# Patient Record
Sex: Female | Born: 1940 | Race: White | Hispanic: No | Marital: Married | Smoking: Never smoker
Health system: Southern US, Community
[De-identification: ages and names within clinical notes are randomized; demographics above are authoritative.]

## PROBLEM LIST (undated history)

## (undated) DIAGNOSIS — I10 Essential (primary) hypertension: Secondary | ICD-10-CM

## (undated) DIAGNOSIS — E785 Hyperlipidemia, unspecified: Secondary | ICD-10-CM

## (undated) DIAGNOSIS — N390 Urinary tract infection, site not specified: Secondary | ICD-10-CM

## (undated) DIAGNOSIS — K760 Fatty (change of) liver, not elsewhere classified: Secondary | ICD-10-CM

## (undated) DIAGNOSIS — I639 Cerebral infarction, unspecified: Secondary | ICD-10-CM

## (undated) DIAGNOSIS — K219 Gastro-esophageal reflux disease without esophagitis: Secondary | ICD-10-CM

## (undated) DIAGNOSIS — C8580 Other specified types of non-Hodgkin lymphoma, unspecified site: Secondary | ICD-10-CM

## (undated) DIAGNOSIS — G629 Polyneuropathy, unspecified: Secondary | ICD-10-CM

## (undated) DIAGNOSIS — M797 Fibromyalgia: Secondary | ICD-10-CM

## (undated) DIAGNOSIS — I63411 Cerebral infarction due to embolism of right middle cerebral artery: Secondary | ICD-10-CM

## (undated) DIAGNOSIS — I480 Paroxysmal atrial fibrillation: Secondary | ICD-10-CM

## (undated) DIAGNOSIS — C8590 Non-Hodgkin lymphoma, unspecified, unspecified site: Secondary | ICD-10-CM

## (undated) DIAGNOSIS — M1990 Unspecified osteoarthritis, unspecified site: Secondary | ICD-10-CM

## (undated) DIAGNOSIS — C801 Malignant (primary) neoplasm, unspecified: Secondary | ICD-10-CM

## (undated) HISTORY — PX: APPENDECTOMY: SHX54

## (undated) HISTORY — DX: Paroxysmal atrial fibrillation: I48.0

## (undated) HISTORY — PX: ROTATOR CUFF REPAIR: SHX139

## (undated) HISTORY — DX: Non-Hodgkin lymphoma, unspecified, unspecified site: C85.90

## (undated) HISTORY — DX: Cerebral infarction, unspecified: I63.9

## (undated) HISTORY — PX: JOINT REPLACEMENT: SHX530

## (undated) HISTORY — PX: EYE SURGERY: SHX253

## (undated) HISTORY — DX: Hyperlipidemia, unspecified: E78.5

## (undated) HISTORY — DX: Polyneuropathy, unspecified: G62.9

## (undated) HISTORY — PX: CHOLECYSTECTOMY: SHX55

---

## 1997-10-27 ENCOUNTER — Other Ambulatory Visit: Admission: RE | Admit: 1997-10-27 | Discharge: 1997-10-27 | Payer: Self-pay | Admitting: Obstetrics and Gynecology

## 1998-08-23 ENCOUNTER — Emergency Department (HOSPITAL_COMMUNITY): Admission: EM | Admit: 1998-08-23 | Discharge: 1998-08-23 | Payer: Self-pay | Admitting: Emergency Medicine

## 1998-08-23 ENCOUNTER — Encounter: Payer: Self-pay | Admitting: Emergency Medicine

## 1999-02-11 ENCOUNTER — Ambulatory Visit (HOSPITAL_COMMUNITY): Admission: RE | Admit: 1999-02-11 | Discharge: 1999-02-11 | Payer: Self-pay | Admitting: *Deleted

## 1999-02-11 ENCOUNTER — Encounter: Payer: Self-pay | Admitting: *Deleted

## 2001-09-06 ENCOUNTER — Ambulatory Visit (HOSPITAL_COMMUNITY): Admission: RE | Admit: 2001-09-06 | Discharge: 2001-09-06 | Payer: Self-pay | Admitting: Gastroenterology

## 2001-09-06 ENCOUNTER — Encounter (INDEPENDENT_AMBULATORY_CARE_PROVIDER_SITE_OTHER): Payer: Self-pay | Admitting: Specialist

## 2001-11-15 ENCOUNTER — Encounter: Admission: RE | Admit: 2001-11-15 | Discharge: 2001-11-15 | Payer: Self-pay | Admitting: Family Medicine

## 2001-11-15 ENCOUNTER — Encounter: Payer: Self-pay | Admitting: Family Medicine

## 2002-03-12 ENCOUNTER — Encounter: Admission: RE | Admit: 2002-03-12 | Discharge: 2002-03-12 | Payer: Self-pay

## 2002-11-27 ENCOUNTER — Other Ambulatory Visit: Admission: RE | Admit: 2002-11-27 | Discharge: 2002-11-27 | Payer: Self-pay | Admitting: Family Medicine

## 2002-12-09 ENCOUNTER — Encounter: Payer: Self-pay | Admitting: Family Medicine

## 2002-12-09 ENCOUNTER — Ambulatory Visit (HOSPITAL_COMMUNITY): Admission: RE | Admit: 2002-12-09 | Discharge: 2002-12-09 | Payer: Self-pay | Admitting: Family Medicine

## 2004-01-05 ENCOUNTER — Other Ambulatory Visit: Admission: RE | Admit: 2004-01-05 | Discharge: 2004-01-05 | Payer: Self-pay | Admitting: Family Medicine

## 2005-03-01 ENCOUNTER — Encounter: Admission: RE | Admit: 2005-03-01 | Discharge: 2005-03-01 | Payer: Self-pay | Admitting: Family Medicine

## 2005-12-06 ENCOUNTER — Other Ambulatory Visit: Admission: RE | Admit: 2005-12-06 | Discharge: 2005-12-06 | Payer: Self-pay | Admitting: Family Medicine

## 2006-01-02 ENCOUNTER — Ambulatory Visit: Payer: Self-pay | Admitting: Internal Medicine

## 2006-01-09 LAB — CBC WITH DIFFERENTIAL/PLATELET
BASO%: 0.1 % (ref 0.0–2.0)
EOS%: 0.2 % (ref 0.0–7.0)
HCT: 35 % (ref 34.8–46.6)
LYMPH%: 5.8 % — ABNORMAL LOW (ref 14.0–48.0)
MCH: 27.5 pg (ref 26.0–34.0)
MCHC: 33.8 g/dL (ref 32.0–36.0)
MCV: 81.4 fL (ref 81.0–101.0)
MONO#: 0 10*3/uL — ABNORMAL LOW (ref 0.1–0.9)
MONO%: 0.2 % (ref 0.0–13.0)
NEUT%: 93.7 % — ABNORMAL HIGH (ref 39.6–76.8)
Platelets: 198 10*3/uL (ref 145–400)
RBC: 4.3 10*6/uL (ref 3.70–5.32)
WBC: 11.1 10*3/uL — ABNORMAL HIGH (ref 3.9–10.0)

## 2006-01-09 LAB — LACTATE DEHYDROGENASE: LDH: 412 U/L — ABNORMAL HIGH (ref 94–250)

## 2006-01-09 LAB — COMPREHENSIVE METABOLIC PANEL
ALT: 129 U/L — ABNORMAL HIGH (ref 0–40)
AST: 98 U/L — ABNORMAL HIGH (ref 0–37)
BUN: 10 mg/dL (ref 6–23)
Calcium: 8.2 mg/dL — ABNORMAL LOW (ref 8.4–10.5)
Chloride: 92 mEq/L — ABNORMAL LOW (ref 96–112)
Creatinine, Ser: 0.87 mg/dL (ref 0.40–1.20)
Total Bilirubin: 0.6 mg/dL (ref 0.3–1.2)

## 2006-01-19 LAB — COMPREHENSIVE METABOLIC PANEL
AST: 36 U/L (ref 0–37)
Alkaline Phosphatase: 162 U/L — ABNORMAL HIGH (ref 39–117)
BUN: 18 mg/dL (ref 6–23)
Glucose, Bld: 138 mg/dL — ABNORMAL HIGH (ref 70–99)
Potassium: 4.1 mEq/L (ref 3.5–5.3)
Total Bilirubin: 0.4 mg/dL (ref 0.3–1.2)

## 2006-01-19 LAB — CBC WITH DIFFERENTIAL/PLATELET
Eosinophils Absolute: 0 10*3/uL (ref 0.0–0.5)
HCT: 36.1 % (ref 34.8–46.6)
LYMPH%: 13.1 % — ABNORMAL LOW (ref 14.0–48.0)
MONO#: 0.7 10*3/uL (ref 0.1–0.9)
NEUT#: 7 10*3/uL — ABNORMAL HIGH (ref 1.5–6.5)
NEUT%: 78.3 % — ABNORMAL HIGH (ref 39.6–76.8)
Platelets: 415 10*3/uL — ABNORMAL HIGH (ref 145–400)
WBC: 8.9 10*3/uL (ref 3.9–10.0)
lymph#: 1.2 10*3/uL (ref 0.9–3.3)

## 2006-01-19 LAB — PHOSPHORUS: Phosphorus: 4.2 mg/dL (ref 2.3–4.6)

## 2006-01-19 LAB — MAGNESIUM: Magnesium: 1.4 mg/dL — ABNORMAL LOW (ref 1.5–2.5)

## 2006-01-26 LAB — MAGNESIUM: Magnesium: 1.4 mg/dL — ABNORMAL LOW (ref 1.5–2.5)

## 2006-01-26 LAB — CBC WITH DIFFERENTIAL/PLATELET
Basophils Absolute: 0 10*3/uL (ref 0.0–0.1)
EOS%: 10.4 % — ABNORMAL HIGH (ref 0.0–7.0)
Eosinophils Absolute: 0 10*3/uL (ref 0.0–0.5)
HCT: 28.6 % — ABNORMAL LOW (ref 34.8–46.6)
HGB: 9.8 g/dL — ABNORMAL LOW (ref 11.6–15.9)
MONO#: 0.1 10*3/uL (ref 0.1–0.9)
NEUT#: 0 10*3/uL — CL (ref 1.5–6.5)
NEUT%: 7 % — ABNORMAL LOW (ref 39.6–76.8)
RDW: 17.5 % — ABNORMAL HIGH (ref 11.3–14.5)
WBC: 0.5 10*3/uL — CL (ref 3.9–10.0)
lymph#: 0.3 10*3/uL — ABNORMAL LOW (ref 0.9–3.3)

## 2006-01-26 LAB — COMPREHENSIVE METABOLIC PANEL
AST: 22 U/L (ref 0–37)
Albumin: 3.7 g/dL (ref 3.5–5.2)
BUN: 17 mg/dL (ref 6–23)
CO2: 24 mEq/L (ref 19–32)
Calcium: 8.8 mg/dL (ref 8.4–10.5)
Chloride: 99 mEq/L (ref 96–112)
Glucose, Bld: 124 mg/dL — ABNORMAL HIGH (ref 70–99)
Potassium: 4.1 mEq/L (ref 3.5–5.3)

## 2006-01-29 ENCOUNTER — Inpatient Hospital Stay (HOSPITAL_COMMUNITY): Admission: AD | Admit: 2006-01-29 | Discharge: 2006-01-31 | Payer: Self-pay | Admitting: Internal Medicine

## 2006-01-31 ENCOUNTER — Ambulatory Visit: Payer: Self-pay | Admitting: Internal Medicine

## 2006-02-08 LAB — CBC WITH DIFFERENTIAL/PLATELET
Basophils Absolute: 0 10*3/uL (ref 0.0–0.1)
Eosinophils Absolute: 0 10*3/uL (ref 0.0–0.5)
HGB: 12 g/dL (ref 11.6–15.9)
LYMPH%: 24.7 % (ref 14.0–48.0)
MCV: 84.6 fL (ref 81.0–101.0)
MONO%: 9.6 % (ref 0.0–13.0)
NEUT#: 6.3 10*3/uL (ref 1.5–6.5)
NEUT%: 65.2 % (ref 39.6–76.8)
Platelets: 523 10*3/uL — ABNORMAL HIGH (ref 145–400)
RBC: 4.11 10*6/uL (ref 3.70–5.32)

## 2006-02-08 LAB — COMPREHENSIVE METABOLIC PANEL
Alkaline Phosphatase: 137 U/L — ABNORMAL HIGH (ref 39–117)
BUN: 15 mg/dL (ref 6–23)
Creatinine, Ser: 0.92 mg/dL (ref 0.40–1.20)
Glucose, Bld: 231 mg/dL — ABNORMAL HIGH (ref 70–99)
Total Bilirubin: 0.8 mg/dL (ref 0.3–1.2)

## 2006-02-16 LAB — CBC WITH DIFFERENTIAL/PLATELET
EOS%: 14.2 % — ABNORMAL HIGH (ref 0.0–7.0)
Eosinophils Absolute: 0.1 10*3/uL (ref 0.0–0.5)
MCV: 84.3 fL (ref 81.0–101.0)
MONO%: 11.5 % (ref 0.0–13.0)
NEUT#: 0.1 10*3/uL — CL (ref 1.5–6.5)
RBC: 3.68 10*6/uL — ABNORMAL LOW (ref 3.70–5.32)
RDW: 18.4 % — ABNORMAL HIGH (ref 11.3–14.5)

## 2006-02-16 LAB — COMPREHENSIVE METABOLIC PANEL
ALT: 33 U/L (ref 0–40)
AST: 16 U/L (ref 0–37)
Albumin: 4.1 g/dL (ref 3.5–5.2)
Alkaline Phosphatase: 119 U/L — ABNORMAL HIGH (ref 39–117)
Glucose, Bld: 117 mg/dL — ABNORMAL HIGH (ref 70–99)
Potassium: 3.8 mEq/L (ref 3.5–5.3)
Sodium: 140 mEq/L (ref 135–145)
Total Protein: 6.7 g/dL (ref 6.0–8.3)

## 2006-02-21 ENCOUNTER — Ambulatory Visit: Payer: Self-pay | Admitting: Internal Medicine

## 2006-02-24 ENCOUNTER — Ambulatory Visit (HOSPITAL_COMMUNITY): Admission: RE | Admit: 2006-02-24 | Discharge: 2006-02-24 | Payer: Self-pay | Admitting: Internal Medicine

## 2006-02-24 LAB — COMPREHENSIVE METABOLIC PANEL
Albumin: 4 g/dL (ref 3.5–5.2)
Alkaline Phosphatase: 121 U/L — ABNORMAL HIGH (ref 39–117)
BUN: 16 mg/dL (ref 6–23)
CO2: 25 mEq/L (ref 19–32)
Calcium: 9 mg/dL (ref 8.4–10.5)
Chloride: 107 mEq/L (ref 96–112)
Glucose, Bld: 112 mg/dL — ABNORMAL HIGH (ref 70–99)
Potassium: 4.1 mEq/L (ref 3.5–5.3)
Sodium: 140 mEq/L (ref 135–145)
Total Protein: 6.4 g/dL (ref 6.0–8.3)

## 2006-02-24 LAB — CBC WITH DIFFERENTIAL/PLATELET
Basophils Absolute: 0 10*3/uL (ref 0.0–0.1)
Eosinophils Absolute: 0 10*3/uL (ref 0.0–0.5)
HGB: 10 g/dL — ABNORMAL LOW (ref 11.6–15.9)
MCV: 86.3 fL (ref 81.0–101.0)
MONO#: 0.7 10*3/uL (ref 0.1–0.9)
NEUT#: 8.9 10*3/uL — ABNORMAL HIGH (ref 1.5–6.5)
RBC: 3.37 10*6/uL — ABNORMAL LOW (ref 3.70–5.32)
RDW: 20.1 % — ABNORMAL HIGH (ref 11.3–14.5)
WBC: 10.7 10*3/uL — ABNORMAL HIGH (ref 3.9–10.0)
lymph#: 1.1 10*3/uL (ref 0.9–3.3)

## 2006-02-24 LAB — PHOSPHORUS: Phosphorus: 4.2 mg/dL (ref 2.3–4.6)

## 2006-02-24 LAB — MAGNESIUM: Magnesium: 1.7 mg/dL (ref 1.5–2.5)

## 2006-03-02 LAB — CBC WITH DIFFERENTIAL/PLATELET
EOS%: 0.4 % (ref 0.0–7.0)
LYMPH%: 18.2 % (ref 14.0–48.0)
MCH: 30.5 pg (ref 26.0–34.0)
MCHC: 34.2 g/dL (ref 32.0–36.0)
MCV: 89.2 fL (ref 81.0–101.0)
MONO%: 4.6 % (ref 0.0–13.0)
Platelets: 326 10*3/uL (ref 145–400)
RBC: 2.86 10*6/uL — ABNORMAL LOW (ref 3.70–5.32)
RDW: 24.5 % — ABNORMAL HIGH (ref 11.3–14.5)

## 2006-03-09 LAB — CBC WITH DIFFERENTIAL/PLATELET
BASO%: 3.1 % — ABNORMAL HIGH (ref 0.0–2.0)
Eosinophils Absolute: 0.1 10*3/uL (ref 0.0–0.5)
LYMPH%: 63.1 % — ABNORMAL HIGH (ref 14.0–48.0)
MCHC: 35.1 g/dL (ref 32.0–36.0)
MONO#: 0.1 10*3/uL (ref 0.1–0.9)
MONO%: 14.6 % — ABNORMAL HIGH (ref 0.0–13.0)
NEUT#: 0 10*3/uL — CL (ref 1.5–6.5)
RBC: 2.84 10*6/uL — ABNORMAL LOW (ref 3.70–5.32)
RDW: 23 % — ABNORMAL HIGH (ref 11.3–14.5)
WBC: 0.5 10*3/uL — CL (ref 3.9–10.0)

## 2006-03-09 LAB — COMPREHENSIVE METABOLIC PANEL
ALT: 24 U/L (ref 0–40)
Albumin: 3.8 g/dL (ref 3.5–5.2)
Alkaline Phosphatase: 98 U/L (ref 39–117)
CO2: 23 mEq/L (ref 19–32)
Glucose, Bld: 187 mg/dL — ABNORMAL HIGH (ref 70–99)
Potassium: 3.5 mEq/L (ref 3.5–5.3)
Sodium: 138 mEq/L (ref 135–145)
Total Bilirubin: 1.6 mg/dL — ABNORMAL HIGH (ref 0.3–1.2)
Total Protein: 6.1 g/dL (ref 6.0–8.3)

## 2006-03-09 LAB — MAGNESIUM: Magnesium: 1.5 mg/dL (ref 1.5–2.5)

## 2006-03-16 LAB — CBC WITH DIFFERENTIAL/PLATELET
EOS%: 0.3 % (ref 0.0–7.0)
MCH: 32.2 pg (ref 26.0–34.0)
MCHC: 34 g/dL (ref 32.0–36.0)
MCV: 94.6 fL (ref 81.0–101.0)
MONO%: 6.3 % (ref 0.0–13.0)
RBC: 2.85 10*6/uL — ABNORMAL LOW (ref 3.70–5.32)
RDW: 25.4 % — ABNORMAL HIGH (ref 11.3–14.5)

## 2006-03-16 LAB — PHOSPHORUS: Phosphorus: 4.6 mg/dL (ref 2.3–4.6)

## 2006-03-16 LAB — COMPREHENSIVE METABOLIC PANEL
AST: 21 U/L (ref 0–37)
Albumin: 3.9 g/dL (ref 3.5–5.2)
Alkaline Phosphatase: 120 U/L — ABNORMAL HIGH (ref 39–117)
Potassium: 4 mEq/L (ref 3.5–5.3)
Sodium: 139 mEq/L (ref 135–145)
Total Protein: 6.5 g/dL (ref 6.0–8.3)

## 2006-03-30 ENCOUNTER — Encounter (HOSPITAL_COMMUNITY): Admission: RE | Admit: 2006-03-30 | Discharge: 2006-06-15 | Payer: Self-pay | Admitting: Internal Medicine

## 2006-03-30 LAB — CBC WITH DIFFERENTIAL/PLATELET
Basophils Absolute: 0 10*3/uL (ref 0.0–0.1)
EOS%: 9.8 % — ABNORMAL HIGH (ref 0.0–7.0)
Eosinophils Absolute: 0 10*3/uL (ref 0.0–0.5)
HCT: 22.8 % — ABNORMAL LOW (ref 34.8–46.6)
HGB: 8 g/dL — ABNORMAL LOW (ref 11.6–15.9)
MCH: 34 pg (ref 26.0–34.0)
NEUT#: 0 10*3/uL — CL (ref 1.5–6.5)
NEUT%: 5.3 % — ABNORMAL LOW (ref 39.6–76.8)
lymph#: 0.3 10*3/uL — ABNORMAL LOW (ref 0.9–3.3)

## 2006-04-04 ENCOUNTER — Ambulatory Visit: Payer: Self-pay | Admitting: Internal Medicine

## 2006-04-06 LAB — COMPREHENSIVE METABOLIC PANEL
ALT: 27 U/L (ref 0–40)
AST: 20 U/L (ref 0–37)
Alkaline Phosphatase: 104 U/L (ref 39–117)
Creatinine, Ser: 0.87 mg/dL (ref 0.40–1.20)
Total Bilirubin: 1 mg/dL (ref 0.3–1.2)

## 2006-04-06 LAB — CBC WITH DIFFERENTIAL/PLATELET
Basophils Absolute: 0 10*3/uL (ref 0.0–0.1)
Eosinophils Absolute: 0 10*3/uL (ref 0.0–0.5)
HCT: 31.1 % — ABNORMAL LOW (ref 34.8–46.6)
HGB: 10.6 g/dL — ABNORMAL LOW (ref 11.6–15.9)
LYMPH%: 9.4 % — ABNORMAL LOW (ref 14.0–48.0)
MCV: 96 fL (ref 81.0–101.0)
MONO%: 8.2 % (ref 0.0–13.0)
NEUT#: 4.1 10*3/uL (ref 1.5–6.5)
NEUT%: 82 % — ABNORMAL HIGH (ref 39.6–76.8)
Platelets: 234 10*3/uL (ref 145–400)
RBC: 3.24 10*6/uL — ABNORMAL LOW (ref 3.70–5.32)

## 2006-04-06 LAB — PHOSPHORUS: Phosphorus: 4.3 mg/dL (ref 2.3–4.6)

## 2006-04-20 LAB — CBC WITH DIFFERENTIAL/PLATELET
Basophils Absolute: 0 10*3/uL (ref 0.0–0.1)
EOS%: 18.2 % — ABNORMAL HIGH (ref 0.0–7.0)
HGB: 10 g/dL — ABNORMAL LOW (ref 11.6–15.9)
LYMPH%: 58.7 % — ABNORMAL HIGH (ref 14.0–48.0)
MCH: 32.5 pg (ref 26.0–34.0)
MCV: 93.3 fL (ref 81.0–101.0)
MONO%: 12.9 % (ref 0.0–13.0)
NEUT%: 7.3 % — ABNORMAL LOW (ref 39.6–76.8)
Platelets: 81 10*3/uL — ABNORMAL LOW (ref 145–400)
RDW: 15.1 % — ABNORMAL HIGH (ref 11.3–14.5)

## 2006-04-20 LAB — COMPREHENSIVE METABOLIC PANEL
ALT: 27 U/L (ref 0–35)
AST: 15 U/L (ref 0–37)
BUN: 16 mg/dL (ref 6–23)
Creatinine, Ser: 0.84 mg/dL (ref 0.40–1.20)
Total Bilirubin: 1.4 mg/dL — ABNORMAL HIGH (ref 0.3–1.2)

## 2006-04-20 LAB — PHOSPHORUS: Phosphorus: 4.1 mg/dL (ref 2.3–4.6)

## 2006-04-20 LAB — LACTATE DEHYDROGENASE: LDH: 132 U/L (ref 94–250)

## 2006-04-27 LAB — COMPREHENSIVE METABOLIC PANEL
AST: 23 U/L (ref 0–37)
Albumin: 3.8 g/dL (ref 3.5–5.2)
BUN: 12 mg/dL (ref 6–23)
Calcium: 9.3 mg/dL (ref 8.4–10.5)
Chloride: 105 mEq/L (ref 96–112)
Creatinine, Ser: 0.92 mg/dL (ref 0.40–1.20)
Glucose, Bld: 180 mg/dL — ABNORMAL HIGH (ref 70–99)
Potassium: 4 mEq/L (ref 3.5–5.3)

## 2006-04-27 LAB — CBC WITH DIFFERENTIAL/PLATELET
Basophils Absolute: 0 10*3/uL (ref 0.0–0.1)
EOS%: 0.4 % (ref 0.0–7.0)
Eosinophils Absolute: 0 10*3/uL (ref 0.0–0.5)
HCT: 28.9 % — ABNORMAL LOW (ref 34.8–46.6)
HGB: 10.1 g/dL — ABNORMAL LOW (ref 11.6–15.9)
MCH: 33.7 pg (ref 26.0–34.0)
MCV: 96.9 fL (ref 81.0–101.0)
NEUT#: 5 10*3/uL (ref 1.5–6.5)
NEUT%: 84 % — ABNORMAL HIGH (ref 39.6–76.8)
lymph#: 0.4 10*3/uL — ABNORMAL LOW (ref 0.9–3.3)

## 2006-04-27 LAB — LACTATE DEHYDROGENASE: LDH: 195 U/L (ref 94–250)

## 2006-05-01 ENCOUNTER — Ambulatory Visit (HOSPITAL_COMMUNITY): Admission: RE | Admit: 2006-05-01 | Discharge: 2006-05-01 | Payer: Self-pay | Admitting: Internal Medicine

## 2006-05-03 LAB — CBC WITH DIFFERENTIAL/PLATELET
BASO%: 0.2 % (ref 0.0–2.0)
Basophils Absolute: 0 10*3/uL (ref 0.0–0.1)
EOS%: 1 % (ref 0.0–7.0)
HCT: 27 % — ABNORMAL LOW (ref 34.8–46.6)
HGB: 9.3 g/dL — ABNORMAL LOW (ref 11.6–15.9)
MCH: 33.5 pg (ref 26.0–34.0)
MCHC: 34.3 g/dL (ref 32.0–36.0)
MCV: 97.9 fL (ref 81.0–101.0)
MONO%: 18.1 % — ABNORMAL HIGH (ref 0.0–13.0)
NEUT%: 62.5 % (ref 39.6–76.8)
RDW: 19.1 % — ABNORMAL HIGH (ref 11.3–14.5)

## 2006-05-03 LAB — COMPREHENSIVE METABOLIC PANEL
ALT: 33 U/L (ref 0–35)
AST: 31 U/L (ref 0–37)
Alkaline Phosphatase: 120 U/L — ABNORMAL HIGH (ref 39–117)
BUN: 17 mg/dL (ref 6–23)
Creatinine, Ser: 1.07 mg/dL (ref 0.40–1.20)
Total Bilirubin: 0.7 mg/dL (ref 0.3–1.2)

## 2006-05-12 LAB — COMPREHENSIVE METABOLIC PANEL
Albumin: 3.7 g/dL (ref 3.5–5.2)
Alkaline Phosphatase: 82 U/L (ref 39–117)
BUN: 12 mg/dL (ref 6–23)
Creatinine, Ser: 0.85 mg/dL (ref 0.40–1.20)
Glucose, Bld: 183 mg/dL — ABNORMAL HIGH (ref 70–99)
Potassium: 3.3 mEq/L — ABNORMAL LOW (ref 3.5–5.3)

## 2006-05-12 LAB — CBC WITH DIFFERENTIAL/PLATELET
Basophils Absolute: 0 10*3/uL (ref 0.0–0.1)
Eosinophils Absolute: 0 10*3/uL (ref 0.0–0.5)
HCT: 25.7 % — ABNORMAL LOW (ref 34.8–46.6)
HGB: 9 g/dL — ABNORMAL LOW (ref 11.6–15.9)
LYMPH%: 35 % (ref 14.0–48.0)
MCH: 33.7 pg (ref 26.0–34.0)
MCV: 96.1 fL (ref 81.0–101.0)
MONO%: 35.8 % — ABNORMAL HIGH (ref 0.0–13.0)
NEUT#: 0.1 10*3/uL — CL (ref 1.5–6.5)
NEUT%: 21.3 % — ABNORMAL LOW (ref 39.6–76.8)
Platelets: 48 10*3/uL — ABNORMAL LOW (ref 145–400)
RDW: 17.6 % — ABNORMAL HIGH (ref 11.3–14.5)

## 2006-05-15 ENCOUNTER — Ambulatory Visit: Payer: Self-pay | Admitting: Internal Medicine

## 2006-05-15 LAB — CBC WITH DIFFERENTIAL/PLATELET
Basophils Absolute: 0 10*3/uL (ref 0.0–0.1)
HCT: 26.9 % — ABNORMAL LOW (ref 34.8–46.6)
HGB: 9.3 g/dL — ABNORMAL LOW (ref 11.6–15.9)
MCH: 33.5 pg (ref 26.0–34.0)
MONO#: 0.2 10*3/uL (ref 0.1–0.9)
NEUT%: 88.8 % — ABNORMAL HIGH (ref 39.6–76.8)
WBC: 4.7 10*3/uL (ref 3.9–10.0)
lymph#: 0.3 10*3/uL — ABNORMAL LOW (ref 0.9–3.3)

## 2006-05-17 LAB — COMPREHENSIVE METABOLIC PANEL
AST: 27 U/L (ref 0–37)
Alkaline Phosphatase: 87 U/L (ref 39–117)
BUN: 19 mg/dL (ref 6–23)
Calcium: 9.2 mg/dL (ref 8.4–10.5)
Chloride: 102 mEq/L (ref 96–112)
Creatinine, Ser: 1.18 mg/dL (ref 0.40–1.20)

## 2006-05-17 LAB — CBC WITH DIFFERENTIAL/PLATELET
Basophils Absolute: 0.2 10*3/uL — ABNORMAL HIGH (ref 0.0–0.1)
EOS%: 0.5 % (ref 0.0–7.0)
HCT: 29 % — ABNORMAL LOW (ref 34.8–46.6)
HGB: 9.8 g/dL — ABNORMAL LOW (ref 11.6–15.9)
MCH: 33 pg (ref 26.0–34.0)
MCV: 97.2 fL (ref 81.0–101.0)
MONO%: 7.3 % (ref 0.0–13.0)
NEUT%: 77.5 % — ABNORMAL HIGH (ref 39.6–76.8)
Platelets: 194 10*3/uL (ref 145–400)

## 2006-05-23 LAB — CBC WITH DIFFERENTIAL/PLATELET
BASO%: 0.2 % (ref 0.0–2.0)
EOS%: 0.6 % (ref 0.0–7.0)
HCT: 28.2 % — ABNORMAL LOW (ref 34.8–46.6)
MCH: 34 pg (ref 26.0–34.0)
MCHC: 34.2 g/dL (ref 32.0–36.0)
MONO#: 0.4 10*3/uL (ref 0.1–0.9)
NEUT%: 69.9 % (ref 39.6–76.8)
RBC: 2.84 10*6/uL — ABNORMAL LOW (ref 3.70–5.32)
RDW: 19.4 % — ABNORMAL HIGH (ref 11.3–14.5)
WBC: 3.9 10*3/uL (ref 3.9–10.0)
lymph#: 0.7 10*3/uL — ABNORMAL LOW (ref 0.9–3.3)

## 2006-05-23 LAB — COMPREHENSIVE METABOLIC PANEL
ALT: 24 U/L (ref 0–35)
AST: 26 U/L (ref 0–37)
Calcium: 8.9 mg/dL (ref 8.4–10.5)
Chloride: 103 mEq/L (ref 96–112)
Creatinine, Ser: 0.83 mg/dL (ref 0.40–1.20)
Sodium: 141 mEq/L (ref 135–145)
Total Protein: 6.2 g/dL (ref 6.0–8.3)

## 2006-06-01 LAB — CBC WITH DIFFERENTIAL/PLATELET
BASO%: 1.4 % (ref 0.0–2.0)
EOS%: 10.5 % — ABNORMAL HIGH (ref 0.0–7.0)
HCT: 27.3 % — ABNORMAL LOW (ref 34.8–46.6)
LYMPH%: 70.8 % — ABNORMAL HIGH (ref 14.0–48.0)
MCH: 32.8 pg (ref 26.0–34.0)
MCHC: 33.8 g/dL (ref 32.0–36.0)
NEUT%: 4.1 % — ABNORMAL LOW (ref 39.6–76.8)
Platelets: 69 10*3/uL — ABNORMAL LOW (ref 145–400)
RBC: 2.82 10*6/uL — ABNORMAL LOW (ref 3.70–5.32)
lymph#: 0.2 10*3/uL — ABNORMAL LOW (ref 0.9–3.3)

## 2006-06-08 LAB — CBC WITH DIFFERENTIAL/PLATELET
Basophils Absolute: 0 10*3/uL (ref 0.0–0.1)
EOS%: 0.2 % (ref 0.0–7.0)
HCT: 26.8 % — ABNORMAL LOW (ref 34.8–46.6)
HGB: 9.1 g/dL — ABNORMAL LOW (ref 11.6–15.9)
MCH: 33.9 pg (ref 26.0–34.0)
MCV: 99.8 fL (ref 81.0–101.0)
MONO%: 8.5 % (ref 0.0–13.0)
NEUT%: 74.8 % (ref 39.6–76.8)

## 2006-06-09 ENCOUNTER — Ambulatory Visit (HOSPITAL_COMMUNITY): Admission: RE | Admit: 2006-06-09 | Discharge: 2006-06-09 | Payer: Self-pay | Admitting: Internal Medicine

## 2006-06-15 LAB — COMPREHENSIVE METABOLIC PANEL
AST: 24 U/L (ref 0–37)
Albumin: 3.9 g/dL (ref 3.5–5.2)
Alkaline Phosphatase: 92 U/L (ref 39–117)
BUN: 16 mg/dL (ref 6–23)
Potassium: 3.7 mEq/L (ref 3.5–5.3)
Sodium: 142 mEq/L (ref 135–145)
Total Bilirubin: 0.8 mg/dL (ref 0.3–1.2)

## 2006-06-15 LAB — CBC WITH DIFFERENTIAL/PLATELET
Basophils Absolute: 0 10*3/uL (ref 0.0–0.1)
EOS%: 0.8 % (ref 0.0–7.0)
MCH: 34.2 pg — ABNORMAL HIGH (ref 26.0–34.0)
MCV: 101.8 fL — ABNORMAL HIGH (ref 81.0–101.0)
MONO%: 15.1 % — ABNORMAL HIGH (ref 0.0–13.0)
RBC: 2.63 10*6/uL — ABNORMAL LOW (ref 3.70–5.32)
RDW: 21 % — ABNORMAL HIGH (ref 11.3–14.5)

## 2006-09-04 ENCOUNTER — Ambulatory Visit: Payer: Self-pay | Admitting: Internal Medicine

## 2006-09-06 LAB — CBC WITH DIFFERENTIAL/PLATELET
Basophils Absolute: 0 10*3/uL (ref 0.0–0.1)
Eosinophils Absolute: 0.2 10*3/uL (ref 0.0–0.5)
HGB: 13.1 g/dL (ref 11.6–15.9)
LYMPH%: 22.3 % (ref 14.0–48.0)
MCV: 90.7 fL (ref 81.0–101.0)
MONO#: 0.5 10*3/uL (ref 0.1–0.9)
NEUT#: 3.2 10*3/uL (ref 1.5–6.5)
Platelets: 263 10*3/uL (ref 145–400)
RBC: 4.04 10*6/uL (ref 3.70–5.32)
RDW: 13.5 % (ref 11.3–14.5)
WBC: 5.1 10*3/uL (ref 3.9–10.0)

## 2006-09-06 LAB — COMPREHENSIVE METABOLIC PANEL
ALT: 27 U/L (ref 0–35)
AST: 22 U/L (ref 0–37)
Albumin: 3.9 g/dL (ref 3.5–5.2)
BUN: 19 mg/dL (ref 6–23)
Calcium: 9.3 mg/dL (ref 8.4–10.5)
Chloride: 105 mEq/L (ref 96–112)
Potassium: 4.2 mEq/L (ref 3.5–5.3)
Sodium: 141 mEq/L (ref 135–145)
Total Protein: 6.6 g/dL (ref 6.0–8.3)

## 2006-09-08 ENCOUNTER — Ambulatory Visit (HOSPITAL_COMMUNITY): Admission: RE | Admit: 2006-09-08 | Discharge: 2006-09-08 | Payer: Self-pay | Admitting: Internal Medicine

## 2006-10-09 ENCOUNTER — Ambulatory Visit (HOSPITAL_COMMUNITY): Admission: RE | Admit: 2006-10-09 | Discharge: 2006-10-09 | Payer: Self-pay | Admitting: Internal Medicine

## 2006-12-08 ENCOUNTER — Ambulatory Visit: Payer: Self-pay | Admitting: Internal Medicine

## 2006-12-13 LAB — CBC WITH DIFFERENTIAL/PLATELET
BASO%: 0.7 % (ref 0.0–2.0)
EOS%: 3.4 % (ref 0.0–7.0)
LYMPH%: 28.4 % (ref 14.0–48.0)
MCH: 32 pg (ref 26.0–34.0)
MCHC: 35.5 g/dL (ref 32.0–36.0)
MCV: 90.1 fL (ref 81.0–101.0)
MONO%: 14 % — ABNORMAL HIGH (ref 0.0–13.0)
Platelets: 253 10*3/uL (ref 145–400)
RBC: 4.06 10*6/uL (ref 3.70–5.32)
RDW: 13.8 % (ref 11.3–14.5)

## 2006-12-13 LAB — COMPREHENSIVE METABOLIC PANEL
ALT: 29 U/L (ref 0–35)
BUN: 18 mg/dL (ref 6–23)
CO2: 25 mEq/L (ref 19–32)
Calcium: 9.2 mg/dL (ref 8.4–10.5)
Chloride: 106 mEq/L (ref 96–112)
Creatinine, Ser: 0.87 mg/dL (ref 0.40–1.20)
Glucose, Bld: 120 mg/dL — ABNORMAL HIGH (ref 70–99)

## 2006-12-13 LAB — LACTATE DEHYDROGENASE: LDH: 152 U/L (ref 94–250)

## 2006-12-15 ENCOUNTER — Ambulatory Visit (HOSPITAL_COMMUNITY): Admission: RE | Admit: 2006-12-15 | Discharge: 2006-12-15 | Payer: Self-pay | Admitting: Internal Medicine

## 2007-01-17 ENCOUNTER — Inpatient Hospital Stay (HOSPITAL_COMMUNITY): Admission: RE | Admit: 2007-01-17 | Discharge: 2007-01-20 | Payer: Self-pay | Admitting: Orthopedic Surgery

## 2007-02-12 ENCOUNTER — Ambulatory Visit (HOSPITAL_COMMUNITY): Admission: RE | Admit: 2007-02-12 | Discharge: 2007-02-12 | Payer: Self-pay | Admitting: Urology

## 2007-04-13 ENCOUNTER — Ambulatory Visit: Payer: Self-pay | Admitting: Internal Medicine

## 2007-04-17 LAB — CBC WITH DIFFERENTIAL/PLATELET
Eosinophils Absolute: 0.1 10*3/uL (ref 0.0–0.5)
MONO#: 0.6 10*3/uL (ref 0.1–0.9)
NEUT#: 3.8 10*3/uL (ref 1.5–6.5)
Platelets: 268 10*3/uL (ref 145–400)
RBC: 4.21 10*6/uL (ref 3.70–5.32)
RDW: 14.2 % (ref 11.3–14.5)
WBC: 6.3 10*3/uL (ref 3.9–10.0)

## 2007-04-17 LAB — COMPREHENSIVE METABOLIC PANEL
Albumin: 4.2 g/dL (ref 3.5–5.2)
CO2: 23 mEq/L (ref 19–32)
Chloride: 106 mEq/L (ref 96–112)
Glucose, Bld: 112 mg/dL — ABNORMAL HIGH (ref 70–99)
Potassium: 4 mEq/L (ref 3.5–5.3)
Sodium: 141 mEq/L (ref 135–145)
Total Protein: 6.7 g/dL (ref 6.0–8.3)

## 2007-04-17 LAB — LACTATE DEHYDROGENASE: LDH: 166 U/L (ref 94–250)

## 2007-04-19 ENCOUNTER — Ambulatory Visit (HOSPITAL_COMMUNITY): Admission: RE | Admit: 2007-04-19 | Discharge: 2007-04-19 | Payer: Self-pay | Admitting: Internal Medicine

## 2007-08-27 ENCOUNTER — Encounter: Admission: RE | Admit: 2007-08-27 | Discharge: 2007-08-27 | Payer: Self-pay | Admitting: Internal Medicine

## 2007-10-11 ENCOUNTER — Ambulatory Visit: Payer: Self-pay | Admitting: Internal Medicine

## 2007-10-16 LAB — COMPREHENSIVE METABOLIC PANEL
ALT: 40 U/L — ABNORMAL HIGH (ref 0–35)
Albumin: 4.3 g/dL (ref 3.5–5.2)
CO2: 24 mEq/L (ref 19–32)
Calcium: 9.7 mg/dL (ref 8.4–10.5)
Chloride: 101 mEq/L (ref 96–112)
Glucose, Bld: 126 mg/dL — ABNORMAL HIGH (ref 70–99)
Potassium: 4.3 mEq/L (ref 3.5–5.3)
Sodium: 139 mEq/L (ref 135–145)
Total Protein: 6.8 g/dL (ref 6.0–8.3)

## 2007-10-16 LAB — LACTATE DEHYDROGENASE: LDH: 162 U/L (ref 94–250)

## 2007-10-16 LAB — CBC WITH DIFFERENTIAL/PLATELET
BASO%: 0.1 % (ref 0.0–2.0)
Eosinophils Absolute: 0.2 10*3/uL (ref 0.0–0.5)
HCT: 40.3 % (ref 34.8–46.6)
MCHC: 35.2 g/dL (ref 32.0–36.0)
MONO#: 0.6 10*3/uL (ref 0.1–0.9)
NEUT#: 4.1 10*3/uL (ref 1.5–6.5)
Platelets: 265 10*3/uL (ref 145–400)
RBC: 4.43 10*6/uL (ref 3.70–5.32)
WBC: 7 10*3/uL (ref 3.9–10.0)
lymph#: 2.1 10*3/uL (ref 0.9–3.3)

## 2007-10-18 ENCOUNTER — Ambulatory Visit (HOSPITAL_COMMUNITY): Admission: RE | Admit: 2007-10-18 | Discharge: 2007-10-18 | Payer: Self-pay | Admitting: Internal Medicine

## 2007-11-01 ENCOUNTER — Other Ambulatory Visit: Admission: RE | Admit: 2007-11-01 | Discharge: 2007-11-01 | Payer: Self-pay | Admitting: Internal Medicine

## 2007-11-14 ENCOUNTER — Ambulatory Visit (HOSPITAL_COMMUNITY): Admission: RE | Admit: 2007-11-14 | Discharge: 2007-11-14 | Payer: Self-pay | Admitting: Internal Medicine

## 2007-11-28 ENCOUNTER — Ambulatory Visit (HOSPITAL_BASED_OUTPATIENT_CLINIC_OR_DEPARTMENT_OTHER): Admission: RE | Admit: 2007-11-28 | Discharge: 2007-11-28 | Payer: Self-pay | Admitting: Urology

## 2007-11-28 ENCOUNTER — Encounter (INDEPENDENT_AMBULATORY_CARE_PROVIDER_SITE_OTHER): Payer: Self-pay | Admitting: Urology

## 2008-03-03 ENCOUNTER — Inpatient Hospital Stay (HOSPITAL_COMMUNITY): Admission: RE | Admit: 2008-03-03 | Discharge: 2008-03-05 | Payer: Self-pay | Admitting: Urology

## 2008-03-03 ENCOUNTER — Encounter (INDEPENDENT_AMBULATORY_CARE_PROVIDER_SITE_OTHER): Payer: Self-pay | Admitting: Urology

## 2008-04-11 ENCOUNTER — Ambulatory Visit: Payer: Self-pay | Admitting: Internal Medicine

## 2008-04-15 ENCOUNTER — Ambulatory Visit (HOSPITAL_COMMUNITY): Admission: RE | Admit: 2008-04-15 | Discharge: 2008-04-15 | Payer: Self-pay | Admitting: Internal Medicine

## 2008-04-15 LAB — CBC WITH DIFFERENTIAL/PLATELET
BASO%: 0.5 % (ref 0.0–2.0)
Eosinophils Absolute: 0.3 10*3/uL (ref 0.0–0.5)
LYMPH%: 21.8 % (ref 14.0–48.0)
MCHC: 34.3 g/dL (ref 32.0–36.0)
MCV: 91.4 fL (ref 81.0–101.0)
MONO%: 6.9 % (ref 0.0–13.0)
NEUT%: 67.5 % (ref 39.6–76.8)
Platelets: 248 10*3/uL (ref 145–400)
RBC: 4.52 10*6/uL (ref 3.70–5.32)

## 2008-04-15 LAB — COMPREHENSIVE METABOLIC PANEL
ALT: 47 U/L — ABNORMAL HIGH (ref 0–35)
BUN: 18 mg/dL (ref 6–23)
CO2: 25 mEq/L (ref 19–32)
Creatinine, Ser: 0.96 mg/dL (ref 0.40–1.20)
Total Bilirubin: 1 mg/dL (ref 0.3–1.2)

## 2008-04-15 LAB — LACTATE DEHYDROGENASE: LDH: 147 U/L (ref 94–250)

## 2008-07-17 ENCOUNTER — Ambulatory Visit (HOSPITAL_COMMUNITY): Admission: RE | Admit: 2008-07-17 | Discharge: 2008-07-17 | Payer: Self-pay | Admitting: Urology

## 2008-10-13 ENCOUNTER — Ambulatory Visit: Payer: Self-pay | Admitting: Internal Medicine

## 2008-10-15 ENCOUNTER — Ambulatory Visit (HOSPITAL_COMMUNITY): Admission: RE | Admit: 2008-10-15 | Discharge: 2008-10-15 | Payer: Self-pay | Admitting: Internal Medicine

## 2008-10-15 LAB — CBC WITH DIFFERENTIAL/PLATELET
BASO%: 0.6 % (ref 0.0–2.0)
EOS%: 3.8 % (ref 0.0–7.0)
LYMPH%: 29.6 % (ref 14.0–49.7)
MCHC: 34.4 g/dL (ref 31.5–36.0)
MCV: 91.7 fL (ref 79.5–101.0)
MONO#: 0.6 10*3/uL (ref 0.1–0.9)
MONO%: 9.2 % (ref 0.0–14.0)
Platelets: 221 10*3/uL (ref 145–400)
RBC: 4.49 10*6/uL (ref 3.70–5.45)
WBC: 6.2 10*3/uL (ref 3.9–10.3)

## 2008-10-15 LAB — COMPREHENSIVE METABOLIC PANEL
ALT: 47 U/L — ABNORMAL HIGH (ref 0–35)
Alkaline Phosphatase: 120 U/L — ABNORMAL HIGH (ref 39–117)
Sodium: 140 mEq/L (ref 135–145)
Total Bilirubin: 1.2 mg/dL (ref 0.3–1.2)
Total Protein: 6.5 g/dL (ref 6.0–8.3)

## 2008-12-23 ENCOUNTER — Other Ambulatory Visit: Admission: RE | Admit: 2008-12-23 | Discharge: 2008-12-23 | Payer: Self-pay | Admitting: Internal Medicine

## 2009-03-24 ENCOUNTER — Ambulatory Visit (HOSPITAL_COMMUNITY): Admission: RE | Admit: 2009-03-24 | Discharge: 2009-03-24 | Payer: Self-pay | Admitting: Urology

## 2009-04-15 ENCOUNTER — Ambulatory Visit: Payer: Self-pay | Admitting: Internal Medicine

## 2009-04-22 ENCOUNTER — Ambulatory Visit (HOSPITAL_COMMUNITY): Admission: RE | Admit: 2009-04-22 | Discharge: 2009-04-22 | Payer: Self-pay | Admitting: Internal Medicine

## 2009-04-22 LAB — CBC WITH DIFFERENTIAL/PLATELET
BASO%: 0.7 % (ref 0.0–2.0)
EOS%: 3.5 % (ref 0.0–7.0)
Eosinophils Absolute: 0.2 10*3/uL (ref 0.0–0.5)
MCH: 31.7 pg (ref 25.1–34.0)
MCV: 94.8 fL (ref 79.5–101.0)
MONO%: 9.2 % (ref 0.0–14.0)
NEUT#: 3.2 10*3/uL (ref 1.5–6.5)
RBC: 4.39 10*6/uL (ref 3.70–5.45)
RDW: 14.5 % (ref 11.2–14.5)

## 2009-04-22 LAB — COMPREHENSIVE METABOLIC PANEL
ALT: 42 U/L — ABNORMAL HIGH (ref 0–35)
AST: 30 U/L (ref 0–37)
Albumin: 4 g/dL (ref 3.5–5.2)
Alkaline Phosphatase: 97 U/L (ref 39–117)
Potassium: 3.9 mEq/L (ref 3.5–5.3)
Sodium: 139 mEq/L (ref 135–145)
Total Protein: 6.7 g/dL (ref 6.0–8.3)

## 2009-04-23 ENCOUNTER — Encounter: Admission: RE | Admit: 2009-04-23 | Discharge: 2009-04-23 | Payer: Self-pay | Admitting: Internal Medicine

## 2009-05-04 ENCOUNTER — Inpatient Hospital Stay (HOSPITAL_COMMUNITY): Admission: RE | Admit: 2009-05-04 | Discharge: 2009-05-07 | Payer: Self-pay | Admitting: Orthopedic Surgery

## 2009-06-01 ENCOUNTER — Encounter: Admission: RE | Admit: 2009-06-01 | Discharge: 2009-06-18 | Payer: Self-pay | Admitting: Orthopedic Surgery

## 2009-06-18 ENCOUNTER — Encounter: Admission: RE | Admit: 2009-06-18 | Discharge: 2009-07-24 | Payer: Self-pay | Admitting: Orthopedic Surgery

## 2009-10-21 ENCOUNTER — Inpatient Hospital Stay (HOSPITAL_COMMUNITY): Admission: AD | Admit: 2009-10-21 | Discharge: 2009-10-25 | Payer: Self-pay | Admitting: Internal Medicine

## 2009-11-02 ENCOUNTER — Ambulatory Visit: Payer: Self-pay | Admitting: Internal Medicine

## 2009-11-04 ENCOUNTER — Ambulatory Visit: Payer: Self-pay | Admitting: Infectious Diseases

## 2009-11-06 ENCOUNTER — Inpatient Hospital Stay (HOSPITAL_COMMUNITY): Admission: AD | Admit: 2009-11-06 | Discharge: 2009-11-22 | Payer: Self-pay | Admitting: Internal Medicine

## 2009-11-06 ENCOUNTER — Ambulatory Visit: Payer: Self-pay | Admitting: Cardiology

## 2009-11-06 ENCOUNTER — Encounter (INDEPENDENT_AMBULATORY_CARE_PROVIDER_SITE_OTHER): Payer: Self-pay | Admitting: Internal Medicine

## 2009-11-07 ENCOUNTER — Ambulatory Visit: Payer: Self-pay | Admitting: Gastroenterology

## 2009-11-10 ENCOUNTER — Encounter (INDEPENDENT_AMBULATORY_CARE_PROVIDER_SITE_OTHER): Payer: Self-pay | Admitting: Internal Medicine

## 2009-11-10 ENCOUNTER — Ambulatory Visit: Payer: Self-pay | Admitting: Internal Medicine

## 2009-11-13 ENCOUNTER — Encounter (INDEPENDENT_AMBULATORY_CARE_PROVIDER_SITE_OTHER): Payer: Self-pay | Admitting: Internal Medicine

## 2009-11-27 ENCOUNTER — Ambulatory Visit (HOSPITAL_COMMUNITY): Admission: RE | Admit: 2009-11-27 | Discharge: 2009-11-27 | Payer: Self-pay | Admitting: Infectious Disease

## 2009-11-30 ENCOUNTER — Telehealth: Payer: Self-pay | Admitting: Infectious Disease

## 2009-11-30 ENCOUNTER — Ambulatory Visit: Payer: Self-pay | Admitting: Infectious Disease

## 2009-11-30 DIAGNOSIS — Z9189 Other specified personal risk factors, not elsewhere classified: Secondary | ICD-10-CM | POA: Insufficient documentation

## 2009-11-30 DIAGNOSIS — R197 Diarrhea, unspecified: Secondary | ICD-10-CM

## 2009-11-30 DIAGNOSIS — N289 Disorder of kidney and ureter, unspecified: Secondary | ICD-10-CM | POA: Insufficient documentation

## 2009-11-30 DIAGNOSIS — C8590 Non-Hodgkin lymphoma, unspecified, unspecified site: Secondary | ICD-10-CM | POA: Insufficient documentation

## 2009-11-30 HISTORY — DX: Diarrhea, unspecified: R19.7

## 2009-11-30 LAB — CONVERTED CEMR LAB
BUN: 13 mg/dL (ref 6–23)
Basophils Absolute: 0.1 10*3/uL (ref 0.0–0.1)
CRP: 1.9 mg/dL — ABNORMAL HIGH (ref <0.6)
Calcium: 9.7 mg/dL (ref 8.4–10.5)
Eosinophils Absolute: 0.3 10*3/uL (ref 0.0–0.7)
Eosinophils Relative: 4 % (ref 0–5)
Glucose, Bld: 102 mg/dL — ABNORMAL HIGH (ref 70–99)
HCT: 37 % (ref 36.0–46.0)
Lymphocytes Relative: 45 % (ref 12–46)
Neutrophils Relative %: 43 % (ref 43–77)
Platelets: 414 10*3/uL — ABNORMAL HIGH (ref 150–400)
RDW: 16.4 % — ABNORMAL HIGH (ref 11.5–15.5)
Sodium: 140 meq/L (ref 135–145)

## 2009-12-01 ENCOUNTER — Ambulatory Visit: Payer: Self-pay | Admitting: Infectious Disease

## 2009-12-25 ENCOUNTER — Ambulatory Visit (HOSPITAL_COMMUNITY): Admission: RE | Admit: 2009-12-25 | Discharge: 2009-12-25 | Payer: Self-pay | Admitting: Infectious Disease

## 2009-12-31 ENCOUNTER — Telehealth: Payer: Self-pay | Admitting: Infectious Disease

## 2010-01-19 ENCOUNTER — Ambulatory Visit: Payer: Self-pay | Admitting: Infectious Disease

## 2010-01-19 DIAGNOSIS — R109 Unspecified abdominal pain: Secondary | ICD-10-CM | POA: Insufficient documentation

## 2010-01-19 LAB — CONVERTED CEMR LAB
BUN: 15 mg/dL (ref 6–23)
CO2: 25 meq/L (ref 19–32)
Calcium: 9.5 mg/dL (ref 8.4–10.5)
Casts: NONE SEEN /lpf
Chloride: 101 meq/L (ref 96–112)
Creatinine, Ser: 0.98 mg/dL (ref 0.40–1.20)
Eosinophils Relative: 5 % (ref 0–5)
Glucose, Bld: 99 mg/dL (ref 70–99)
HCT: 41.4 % (ref 36.0–46.0)
Hemoglobin, Urine: NEGATIVE
Hemoglobin: 12.9 g/dL (ref 12.0–15.0)
Lymphocytes Relative: 45 % (ref 12–46)
Monocytes Absolute: 0.6 10*3/uL (ref 0.1–1.0)
Monocytes Relative: 8 % (ref 3–12)
Neutro Abs: 3 10*3/uL (ref 1.7–7.7)
Nitrite: NEGATIVE
RBC: 4.39 M/uL (ref 3.87–5.11)
Specific Gravity, Urine: 1.01 (ref 1.005–1.030)
Total Bilirubin: 1 mg/dL (ref 0.3–1.2)
Urine Glucose: NEGATIVE mg/dL
pH: 6 (ref 5.0–8.0)

## 2010-01-20 ENCOUNTER — Encounter: Payer: Self-pay | Admitting: Infectious Disease

## 2010-07-11 ENCOUNTER — Encounter: Payer: Self-pay | Admitting: Internal Medicine

## 2010-07-22 NOTE — Miscellaneous (Signed)
Summary: HIPAA Restrictions  HIPAA Restrictions   Imported By: Florinda Marker 11/30/2009 11:54:16  _____________________________________________________________________  External Attachment:    Type:   Image     Comment:   External Document

## 2010-07-22 NOTE — Progress Notes (Signed)
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

## 2010-07-22 NOTE — Assessment & Plan Note (Signed)
Summary: F/U OV/VS   Visit Type:  Follow-up Referring Provider:  Pearson Grippe Primary Provider:  Pearson Grippe  CC:  follow-up visit.  History of Present Illness: 70 yo with hx of NHL sp rx in 2008 admitted on May 4 for acute renal failure, nausea, vomiting, and diarrhea.  Her CT scan on suggested possible pyelonephritis and she was treated with ceftriaxone and flagyl and then converted to Cipro along with Flagyl. She was readmitted on May the 17th again with diarrhea, and subjective fevers, and malaise and right sided flank pain. CT  chest, abd and pelvis on the 19th showed enlarged of right kidney suspiciuos for malignant infiltration of the right kidney vs infection. She was covered w broad spectrum antibiotics including vancomycin, rocephin and flagyl and only grew 15k of CNS from urine. Her blood cxs were repeatedly negative off abx. Her c diff toxins all negative. A CT abd and pelvis on the 25th showed persistent enlarged kidney felt by radiology more c/w malignant infiltration than infection. She underwent IR guided bx of this kidney that showed acute and chronic inflammation w lymphoid aggregates but no malignant cells. flow done on T but not B cells. She was tapered off of all antibiotics but continued to have fevers and yet again had negative blood and urine cultures. She persisted in having R sided flank pain as well. I checked SPEP which showed abnormal spike, HIV and viral hepatitides as well as quantiferon gold, crypto ag, CMV, EBV, ANA, ANCA which were all negative. ESR, CRP and ferritin were all elevated. I remained highly suspicous for malignancy in her kidney and after d/w Dr. Myna Hidalgo ordered PET scan which was done last week and which continues to show hypermetabolic involvement of the kidney. When I saw he rin clinc at last visit she still had flank pain. UA was dirty and culture grew Klebsiella pna that was quite s and I trated this with cipro. She underwent repeat biopsy of her kidney which  did not show  malignancy this time either. It was suppose dto have been sent for culture for AFB, fungal and bacterial culture.She did have flank pain last week withou fevers. It did resolve withotu therapy.  Problems Prior to Update: 1)  Diarrhea  (ICD-787.91) 2)  Unspecified Disorder of Kidney and Ureter  (ICD-593.9) 3)  Fever, Hx of  (ICD-V15.9) 4)  Non-hodgkin's Lymphoma  (ICD-202.80)  Medications Prior to Update: 1)  Bactrim Ds 800-160 Mg Tabs (Sulfamethoxazole-Trimethoprim) .... Take 1 Tablet By Mouth Two Times A Day For 10 Days  Current Medications (verified): 1)  Bactrim Ds 800-160 Mg Tabs (Sulfamethoxazole-Trimethoprim) .... Take 1 Tablet By Mouth Two Times A Day For 10 Days  Allergies (verified): No Known Drug Allergies   Preventive Screening-Counseling & Management  Alcohol-Tobacco     Alcohol drinks/day: 0     Smoking Status: never  Caffeine-Diet-Exercise     Caffeine use/day: coffee,tea and sodas occassionally     Does Patient Exercise: yes     Type of exercise: active around the house  Safety-Violence-Falls     Seat Belt Use: yes   Current Allergies (reviewed today): No known allergies  Family History: Reviewed history from 11/30/2009 and no changes required.    Her father passed away at age 53 of brain cancer.  Her   mother passed away at age 20 of congestive heart failure.  Social History: Reviewed history from 11/30/2009 and no changes required.  The patient is married and lives at home with her  husband.  She is retired.  She does not smoke, drink, or use drugs.   Review of Systems  The patient denies anorexia, fever, weight loss, weight gain, vision loss, decreased hearing, hoarseness, chest pain, syncope, dyspnea on exertion, peripheral edema, prolonged cough, headaches, hemoptysis, abdominal pain, melena, hematochezia, severe indigestion/heartburn, hematuria, incontinence, genital sores, muscle weakness, suspicious skin lesions, transient  blindness, difficulty walking, depression, unusual weight change, abnormal bleeding, and enlarged lymph nodes.    Vital Signs:  Patient profile:   70 year old female Height:      59 inches (149.86 cm) Weight:      163.0 pounds (74.09 kg) BMI:     33.04 Temp:     98.2 degrees F (36.78 degrees C) oral Pulse rate:   58 / minute BP sitting:   168 / 98  (left arm)  Vitals Entered By: Baxter Hire) (January 19, 2010 10:46 AM) CC: follow-up visit Pain Assessment Patient in pain? no      Nutritional Status BMI of > 30 = obese Nutritional Status Detail appetite is pretty good per patient  Does patient need assistance? Functional Status Self care Ambulation Normal   Physical Exam  General:  alert, well-developed, well-nourished, and well-hydrated.   Head:  normocephalic, atraumatic, and no abnormalities observed.   Eyes:  vision grossly intact, pupils equal, pupils round, and pupils reactive to light.   Mouth:  no dental plaque, pharynx pink and moist, no erythema, and no exudates.   Neck:  supple and full ROM.   Lungs:  normal respiratory effort, no intercostal retractions, no crackles, and no wheezes.   Heart:  normal rate, regular rhythm, no murmur, no gallop, and no rub.   Abdomen:  soft, non-tender, normal bowel sounds, no distention, and no masses.   Msk:  normal ROM.  right CVA tenderness Extremities:  No clubbing, cyanosis, edema, or deformity noted with normal full range of motion of all joints.   Neurologic:  alert & oriented X3, strength normal in all extremities, and gait normal.   Psych:  Oriented X3, memory intact for recent and remote, and good eye contact.     Impression & Recommendations:  Problem # 1:  FLANK PAIN, RIGHT (ICD-789.09)  I am going to check ua and culture right now despite the fact that she does not have ssx at this point in time.   Orders: T-CBC w/Diff ([id number]) T-Comprehensive Metabolic Panel [phone]) T-Culture, Urine  ([id number]) T-Urinalysis ([id number]) Est. Patient Level IV (64403)  Problem # 2:  FEVER, HX OF (ICD-V15.9)  Very odd story. Hard to pin this on recurrent UTIs. wil check culture as above. Bx of her kidney has been attempted twice  Orders: Est. 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 BIopsy was done 2nd time and did not show malignacy. Flow was not complete due to shortGE OF CELLS BU twas negaitve in what was looked at.  Orders: Est. Patient Level IV (95638)  Patient Instructions: 1)  We will check labs today 2)  Please make fu appt to see Dr. Daiva Eves 3)  Make appt to see Dr. Laverle Patter

## 2010-07-22 NOTE — Assessment & Plan Note (Signed)
Summary: 11:00 per kvd/hsfu need chart/fever/kam   Vital Signs:  Patient profile:   70 year old female Height:      59 inches (149.86 cm) Weight:      157.4 pounds (71.55 kg) BMI:     31.91 Temp:     97.9 degrees F (36.61 degrees C) oral Pulse rate:   71 / minute BP sitting:   154 / 91  (right arm)  Vitals Entered By: Baxter Hire) (November 30, 2009 11:39 AM)  Visit Type:  Follow-up Referring Provider:  Pearson Grippe Primary Provider:  Pearson Grippe  CC:  fever.  History of Present Illness: 70 yo with hx of NHL sp rx in 2008 admitted on May 4 for acute renal failure, nausea, vomiting, and diarrhea.  Her CT scan on suggested possible pyelonephritis and she was treated with ceftriaxone and flagyl and then converted to Cipro along with Flagyl. She was readmitted on May the 17th again with diarrhea, and subjective fevers, and malaise and right sided flank pain. CT  chest, abd and pelvis on the 19th showed enlarged of right kidney suspiciuos for malignant infiltration of the right kidney vs infection. She was covered w broad spectrum antibiotics including vancomycin, rocephin and flagyl and only grew 15k of CNS from urine. Her blood cxs were repeatedly negative off abx. Her c diff toxins all negative. A CT abd and pelvis on the 25th showed persistent enlarged kidney felt by radiology more c/w malignant infiltration than infection. She underwent IR guided bx of this kidney that showed acute and chronic inflammation w lymphoid aggregates but no malignant cells. flow done on T but not B cells. She was tapered off of all antibiotics but continued to have fevers and yet again had negative blood and urine cultures. She persisted in having R sided flank pain as well. I checked SPEP which showed abnormal spike, HIV and viral hepatitides as well as quantiferon gold, crypto ag, CMV, EBV, ANA, ANCA which were all negative. ESR, CRP and ferritin were all elevated. I remained highly suspicous for malignancy in her  kidney and after d/w Dr. Myna Hidalgo ordered PET scan which was done last week and which continues to show hypermetabolic involvement of the kidney. She returns for followup today. She denies fevers but continues to have right sided flank pain and malaise.  Allergies (verified): No Known Drug Allergies  Past History:  Past Medical History: Osteoarthritis of knees, status post right total knee arthroplasty.  Mild postoperative hyponatremia, mild postoperative acute blood       loss anemia.   History of shingles.   History of bronchitis.  Hypertension.  . Hyperlipidemia.   . Diet-controlled diabetes.  Non-Hodgkin's lymphoma.  .Fibromyalgia.  .Childhood illnesses of measles, mumps, rubella.   Past Surgical History: .Cholecystectomy.  Marland KitchenAppendectomy.  Tubal ligation.   .Rotator cuff repair.   .Left total knee arthroplasty.   Reimplantation of the ureter onto the bladder.      Family History:    Her father passed away at age 11 of brain cancer.  Her   mother passed away at age 63 of congestive heart failure.  Social History:  The patient is married and lives at home with her   husband.  She is retired.  She does not smoke, drink, or use drugs.   Review of Systems  The patient denies anorexia, fever, weight loss, weight gain, vision loss, decreased hearing, hoarseness, chest pain, syncope, dyspnea on exertion, peripheral edema, prolonged cough, headaches, hemoptysis, abdominal pain, melena,  hematochezia, severe indigestion/heartburn, hematuria, incontinence, genital sores, muscle weakness, suspicious skin lesions, transient blindness, difficulty walking, depression, unusual weight change, abnormal bleeding, enlarged lymph nodes, angioedema, breast masses, and testicular masses.    Physical Exam  General:  alert, well-developed, well-nourished, and well-hydrated.   Head:  normocephalic, atraumatic, and no abnormalities observed.   Eyes:  vision grossly intact, pupils equal, pupils  round, and pupils reactive to light.   Ears:  no external deformities and ear piercing(s) noted.   Nose:  no external deformity and no external erythema.   Mouth:  no dental plaque, pharynx pink and moist, no erythema, and no exudates.   Neck:  supple and full ROM.   Lungs:  normal respiratory effort, no intercostal retractions, no crackles, and no wheezes.   Heart:  normal rate, regular rhythm, no murmur, no gallop, and no rub.   Abdomen:  soft, non-tender, normal bowel sounds, no distention, and no masses.   Msk:  normal ROM.  right CVA tenderness Extremities:  No clubbing, cyanosis, edema, or deformity noted with normal full range of motion of all joints.   Neurologic:  alert & oriented X3, strength normal in all extremities, and gait normal.   Skin:  turgor normal, color normal, and no rashes.   Psych:  Oriented X3, memory intact for recent and remote, and good eye contact.     Impression & Recommendations:  Problem # 1:  FEVER, HX OF (ICD-V15.9) Assessment Comment Only  I really, really think that everything points to a malignancy as likely cause of her fevers. We need a repeat biopsy of her kidney. Bone marrow biopsy could also be contemplated. I will discuss this case with Dr. Shirline Frees and see how he would like to proceed by I would imainge biopsy by repeat IR guided approach with specimens sent for path, flow, culture, afb, and fungal would be first appoach.  Orders: Est. Patient Level IV (04540)  Problem # 2:  UNSPECIFIED DISORDER OF KIDNEY AND URETER (ICD-593.9)  see above discussion. She needs repeat bx  Orders: Est. 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 She needs repeat bx  Orders: Est. Patient Level IV (14782)  Problem # 4:  DIARRHEA (ICD-787.91) Assessment: Improved  improved  Orders: Est. Patient Level IV (95621)  Patient Instructions: 1)  rtc to see Dr. Daiva Eves  2)   I will call Dr. Shirline Frees about biopsy of your kidney

## 2010-07-22 NOTE — Progress Notes (Signed)
Summary: Pt. wanting to know results of Kidney biopsy  Phone Note Call from Patient Call back at Home Phone [phone]   Caller: Patient Call For: Dr. Paulette Blanch Dam Reason for Call: Talk to Doctor, Lab or Test Results Summary of Call: Called to find out results of Kidney biopsy.  Pt. informed that the results hav not been reviewed by Dr. Daiva Eves at this time.  Pt. told that she would receive a call Monday, July 18th.  Jennet Maduro RN  December 31, 2009 5:04 PM  Patient called again today very concerned about kidney biposy, I told patient that Dr. Daiva Eves would be back tomorrow morning and I will make sure she gets a phone call tomorrow. Starleen Arms CMA  January 05, 2010 11:33 AM   Initial call taken by: Starleen Arms CMA,  January 05, 2010 11:33 AM  Follow-up for Phone Call        Called and left message that I would call again re biopsy results. Path report did not show cancer on path or flow cytometry though flow was not complete flow dytometry exam. Follow-up by: Acey Lav MD,  January 06, 2010 12:42 PM

## 2010-09-05 LAB — CBC
HCT: 37.3 % (ref 36.0–46.0)
MCH: 30.2 pg (ref 26.0–34.0)
MCV: 90.3 fL (ref 78.0–100.0)
RDW: 17.5 % — ABNORMAL HIGH (ref 11.5–15.5)
WBC: 6.7 10*3/uL (ref 4.0–10.5)

## 2010-09-05 LAB — APTT: aPTT: 25 seconds (ref 24–37)

## 2010-09-05 LAB — GLUCOSE, CAPILLARY
Glucose-Capillary: 101 mg/dL — ABNORMAL HIGH (ref 70–99)
Glucose-Capillary: 118 mg/dL — ABNORMAL HIGH (ref 70–99)

## 2010-09-06 LAB — COMPREHENSIVE METABOLIC PANEL
ALT: 17 U/L (ref 0–35)
ALT: 20 U/L (ref 0–35)
ALT: 21 U/L (ref 0–35)
AST: 71 U/L — ABNORMAL HIGH (ref 0–37)
AST: 63 U/L — ABNORMAL HIGH (ref 0–37)
Albumin: 2.1 g/dL — ABNORMAL LOW (ref 3.5–5.2)
Albumin: 2.2 g/dL — ABNORMAL LOW (ref 3.5–5.2)
Albumin: 2.3 g/dL — ABNORMAL LOW (ref 3.5–5.2)
Alkaline Phosphatase: 64 U/L (ref 39–117)
Alkaline Phosphatase: 70 U/L (ref 39–117)
Alkaline Phosphatase: 79 U/L (ref 39–117)
BUN: 8 mg/dL (ref 6–23)
BUN: 14 mg/dL (ref 6–23)
BUN: 4 mg/dL — ABNORMAL LOW (ref 6–23)
BUN: 6 mg/dL (ref 6–23)
BUN: 9 mg/dL (ref 6–23)
CO2: 26 mEq/L (ref 19–32)
CO2: 23 mEq/L (ref 19–32)
CO2: 27 mEq/L (ref 19–32)
Calcium: 8.5 mg/dL (ref 8.4–10.5)
Calcium: 8 mg/dL — ABNORMAL LOW (ref 8.4–10.5)
Calcium: 8.2 mg/dL — ABNORMAL LOW (ref 8.4–10.5)
Calcium: 8.5 mg/dL (ref 8.4–10.5)
Chloride: 104 mEq/L (ref 96–112)
Chloride: 105 mEq/L (ref 96–112)
Chloride: 107 mEq/L (ref 96–112)
Creatinine, Ser: 0.94 mg/dL (ref 0.4–1.2)
Creatinine, Ser: 0.9 mg/dL (ref 0.4–1.2)
Creatinine, Ser: 0.92 mg/dL (ref 0.4–1.2)
Creatinine, Ser: 1.02 mg/dL (ref 0.4–1.2)
Creatinine, Ser: 1.14 mg/dL (ref 0.4–1.2)
GFR calc Af Amer: >60 mL/min (ref >60)
GFR calc Af Amer: 57 mL/min — ABNORMAL LOW (ref >60)
GFR calc Af Amer: >60 mL/min (ref >60)
GFR calc Af Amer: >60 mL/min (ref >60)
GFR calc non Af Amer: 59 mL/min — ABNORMAL LOW (ref >60)
GFR calc non Af Amer: 55 mL/min — ABNORMAL LOW (ref >60)
GFR calc non Af Amer: >60 mL/min (ref >60)
Glucose, Bld: 118 mg/dL — ABNORMAL HIGH (ref 70–99)
Glucose, Bld: 94 mg/dL (ref 70–99)
Glucose, Bld: 95 mg/dL (ref 70–99)
Glucose, Bld: 96 mg/dL (ref 70–99)
Potassium: 3.7 mEq/L (ref 3.5–5.1)
Potassium: 3.7 mEq/L (ref 3.5–5.1)
Sodium: 142 mEq/L (ref 135–145)
Sodium: 142 mEq/L (ref 135–145)
Total Bilirubin: 0.6 mg/dL (ref 0.3–1.2)
Total Protein: 5.4 g/dL — ABNORMAL LOW (ref 6.0–8.3)
Total Protein: 5.6 g/dL — ABNORMAL LOW (ref 6.0–8.3)
Total Protein: 5.6 g/dL — ABNORMAL LOW (ref 6.0–8.3)
Total Protein: 5.9 g/dL — ABNORMAL LOW (ref 6.0–8.3)
Total Protein: 6.2 g/dL (ref 6.0–8.3)

## 2010-09-06 LAB — CMV IGM: CMV IgM: <8 AU/mL (ref <30.0)

## 2010-09-06 LAB — DIFFERENTIAL
Basophils Absolute: 0 10*3/uL (ref 0.0–0.1)
Basophils Absolute: 0 10*3/uL (ref 0.0–0.1)
Basophils Absolute: 0.1 10*3/uL (ref 0.0–0.1)
Basophils Relative: 0 % (ref 0–1)
Basophils Relative: 0 % (ref 0–1)
Basophils Relative: 1 % (ref 0–1)
Basophils Relative: 1 % (ref 0–1)
Eosinophils Absolute: 0.1 10*3/uL (ref 0.0–0.7)
Eosinophils Absolute: 0.2 10*3/uL (ref 0.0–0.7)
Eosinophils Relative: 2 % (ref 0–5)
Eosinophils Relative: 2 % (ref 0–5)
Lymphocytes Relative: 36 % (ref 12–46)
Lymphocytes Relative: 12 % (ref 12–46)
Lymphocytes Relative: 19 % (ref 12–46)
Lymphocytes Relative: 19 % (ref 12–46)
Lymphs Abs: 1 10*3/uL (ref 0.7–4.0)
Lymphs Abs: 1.1 10*3/uL (ref 0.7–4.0)
Lymphs Abs: 1.3 10*3/uL (ref 0.7–4.0)
Monocytes Absolute: 0.8 10*3/uL (ref 0.1–1.0)
Monocytes Absolute: 0.6 10*3/uL (ref 0.1–1.0)
Monocytes Absolute: 0.7 10*3/uL (ref 0.1–1.0)
Monocytes Relative: 6 % (ref 3–12)
Monocytes Relative: 8 % (ref 3–12)
Monocytes Relative: 9 % (ref 3–12)
Neutro Abs: 3.4 10*3/uL (ref 1.7–7.7)
Neutro Abs: 4.5 10*3/uL (ref 1.7–7.7)
Neutro Abs: 5.8 10*3/uL (ref 1.7–7.7)
Neutro Abs: 9.7 10*3/uL — ABNORMAL HIGH (ref 1.7–7.7)
Neutrophils Relative %: 50 % (ref 43–77)
Neutrophils Relative %: 69 % (ref 43–77)
Neutrophils Relative %: 69 % (ref 43–77)
Neutrophils Relative %: 78 % — ABNORMAL HIGH (ref 43–77)
Neutrophils Relative %: 84 % — ABNORMAL HIGH (ref 43–77)

## 2010-09-06 LAB — CBC
HCT: 27.5 % — ABNORMAL LOW (ref 36.0–46.0)
HCT: 25 % — ABNORMAL LOW (ref 36.0–46.0)
HCT: 26.3 % — ABNORMAL LOW (ref 36.0–46.0)
HCT: 27 % — ABNORMAL LOW (ref 36.0–46.0)
HCT: 27.8 % — ABNORMAL LOW (ref 36.0–46.0)
HCT: 27.8 % — ABNORMAL LOW (ref 36.0–46.0)
HCT: 28 % — ABNORMAL LOW (ref 36.0–46.0)
HCT: 28.1 % — ABNORMAL LOW (ref 36.0–46.0)
Hemoglobin: 11.3 g/dL — ABNORMAL LOW (ref 12.0–15.0)
Hemoglobin: 8.4 g/dL — ABNORMAL LOW (ref 12.0–15.0)
Hemoglobin: 8.5 g/dL — ABNORMAL LOW (ref 12.0–15.0)
Hemoglobin: 8.9 g/dL — ABNORMAL LOW (ref 12.0–15.0)
Hemoglobin: 9.2 g/dL — ABNORMAL LOW (ref 12.0–15.0)
Hemoglobin: 9.3 g/dL — ABNORMAL LOW (ref 12.0–15.0)
Hemoglobin: 9.4 g/dL — ABNORMAL LOW (ref 12.0–15.0)
MCHC: 33.2 g/dL (ref 30.0–36.0)
MCHC: 32.7 g/dL (ref 30.0–36.0)
MCHC: 32.9 g/dL (ref 30.0–36.0)
MCHC: 33 g/dL (ref 30.0–36.0)
MCHC: 33.1 g/dL (ref 30.0–36.0)
MCHC: 33.3 g/dL (ref 30.0–36.0)
MCHC: 33.9 g/dL (ref 30.0–36.0)
MCHC: 34 g/dL (ref 30.0–36.0)
MCV: 89 fL (ref 78.0–100.0)
MCV: 89.5 fL (ref 78.0–100.0)
MCV: 89.9 fL (ref 78.0–100.0)
MCV: 91.1 fL (ref 78.0–100.0)
MCV: 92.3 fL (ref 78.0–100.0)
MCV: 92.3 fL (ref 78.0–100.0)
Platelets: 308 10*3/uL (ref 150–400)
Platelets: 301 10*3/uL (ref 150–400)
Platelets: 329 10*3/uL (ref 150–400)
RBC: 3.1 MIL/uL — ABNORMAL LOW (ref 3.87–5.11)
RBC: 2.92 MIL/uL — ABNORMAL LOW (ref 3.87–5.11)
RBC: 3.04 MIL/uL — ABNORMAL LOW (ref 3.87–5.11)
RBC: 3.13 MIL/uL — ABNORMAL LOW (ref 3.87–5.11)
RBC: 3.79 MIL/uL — ABNORMAL LOW (ref 3.87–5.11)
RDW: 15 % (ref 11.5–15.5)
RDW: 15.3 % (ref 11.5–15.5)
RDW: 15.3 % (ref 11.5–15.5)
RDW: 15.3 % (ref 11.5–15.5)
RDW: 15.4 % (ref 11.5–15.5)
RDW: 15.5 % (ref 11.5–15.5)
WBC: 10.2 10*3/uL (ref 4.0–10.5)
WBC: 11.6 10*3/uL — ABNORMAL HIGH (ref 4.0–10.5)
WBC: 6.5 10*3/uL (ref 4.0–10.5)
WBC: 8.7 10*3/uL (ref 4.0–10.5)

## 2010-09-06 LAB — URINE CULTURE: Special Requests: NEGATIVE

## 2010-09-06 LAB — RHEUMATOID FACTOR: Rhuematoid fact SerPl-aCnc: <20 IU/mL (ref 0–20)

## 2010-09-06 LAB — URINE MICROSCOPIC-ADD ON

## 2010-09-06 LAB — URINALYSIS, ROUTINE W REFLEX MICROSCOPIC
Bilirubin Urine: NEGATIVE
Bilirubin Urine: NEGATIVE
Hgb urine dipstick: NEGATIVE
Hgb urine dipstick: NEGATIVE
Ketones, ur: NEGATIVE mg/dL
Nitrite: NEGATIVE
Protein, ur: NEGATIVE mg/dL
Specific Gravity, Urine: 1.011 (ref 1.005–1.030)
Urobilinogen, UA: 0.2 mg/dL (ref 0.0–1.0)
Urobilinogen, UA: 0.2 mg/dL (ref 0.0–1.0)

## 2010-09-06 LAB — FOLATE RBC: RBC Folate: 400 ng/mL (ref 180–600)

## 2010-09-06 LAB — FERRITIN: Ferritin: 755 ng/mL — ABNORMAL HIGH (ref 10–291)

## 2010-09-06 LAB — CLOSTRIDIUM DIFFICILE EIA
C difficile Toxins A+B, EIA: NEGATIVE
C difficile Toxins A+B, EIA: NEGATIVE

## 2010-09-06 LAB — STOOL CULTURE

## 2010-09-06 LAB — OSMOLALITY, STOOL: Osmolality,Stl: 370 mOsm

## 2010-09-06 LAB — CULTURE, BLOOD (ROUTINE X 2)
Culture: NO GROWTH
Culture: NO GROWTH
Culture: NO GROWTH

## 2010-09-06 LAB — FUNGUS CULTURE, BLOOD

## 2010-09-06 LAB — PROTIME-INR
INR: 1.45 (ref 0.00–1.49)
Prothrombin Time: 17.5 seconds — ABNORMAL HIGH (ref 11.6–15.2)

## 2010-09-06 LAB — PROTEIN ELECTROPHORESIS, SERUM
Albumin ELP: 44.5 % — ABNORMAL LOW (ref 55.8–66.1)
Beta Globulin: 6.1 % (ref 4.7–7.2)
M-Spike, %: 0.35 g/dL
Total Protein ELP: 6.2 g/dL (ref 6.0–8.3)

## 2010-09-06 LAB — HEMOCCULT GUIAC POC 1CARD (OFFICE)
Fecal Occult Bld: NEGATIVE
Fecal Occult Bld: NEGATIVE

## 2010-09-06 LAB — OVA AND PARASITE EXAMINATION: Ova and parasites: NONE SEEN

## 2010-09-06 LAB — ANA: Anti Nuclear Antibody(ANA): NEGATIVE

## 2010-09-06 LAB — CREATININE, SERUM: GFR calc non Af Amer: 59 mL/min — ABNORMAL LOW (ref >60)

## 2010-09-06 LAB — LACTATE DEHYDROGENASE, ISOENZYMES
LD1/LD2 Ratio: 0.59
LDH 2: 27 % — ABNORMAL LOW (ref 30–43)
LDH 3: 25 % (ref 16–26)
LDH 4: 13 % — ABNORMAL HIGH (ref 3–12)
LDH Isoenzymes, Total: 201 U/L (ref 120–250)

## 2010-09-06 LAB — HEPATITIS PANEL, ACUTE
HCV Ab: NEGATIVE
Hep B C IgM: NEGATIVE
Hepatitis B Surface Ag: NEGATIVE

## 2010-09-06 LAB — ANTI-NEUTROPHIL ANTIBODY

## 2010-09-06 LAB — SODIUM, STOOL: Sodium, Stl: 7 mEq/L

## 2010-09-06 LAB — QUANTIFERON TB GOLD ASSAY (BLOOD): Mitogen Minus Nil Value: 6.76 IU/mL

## 2010-09-06 LAB — FECAL LACTOFERRIN, QUANT

## 2010-09-06 LAB — PROCALCITONIN: Procalcitonin: <0.5 ng/mL

## 2010-09-06 LAB — EPSTEIN-BARR VIRUS VCA ANTIBODY PANEL
EBV NA IgG: 1.44 {ISR} — ABNORMAL HIGH
EBV VCA IgM: 0.21 {ISR}

## 2010-09-06 LAB — POTASSIUM, STOOL: Potassium, Stl: 31 mEq/L

## 2010-09-06 LAB — AFB CULTURE, BLOOD

## 2010-09-06 LAB — VANCOMYCIN, TROUGH: Vancomycin Tr: 14.1 ug/mL (ref 10.0–20.0)

## 2010-09-06 LAB — GLUCOSE, CAPILLARY: Glucose-Capillary: 125 mg/dL — ABNORMAL HIGH (ref 70–99)

## 2010-09-06 LAB — SEDIMENTATION RATE: Sed Rate: 85 mm/hr — ABNORMAL HIGH (ref 0–22)

## 2010-09-07 LAB — CBC
HCT: 36 % (ref 36.0–46.0)
Hemoglobin: 10.8 g/dL — ABNORMAL LOW (ref 12.0–15.0)
Hemoglobin: 12.6 g/dL (ref 12.0–15.0)
MCV: 92.3 fL (ref 78.0–100.0)
RBC: 3.43 MIL/uL — ABNORMAL LOW (ref 3.87–5.11)
RBC: 3.91 MIL/uL (ref 3.87–5.11)
WBC: 10 10*3/uL (ref 4.0–10.5)

## 2010-09-07 LAB — COMPREHENSIVE METABOLIC PANEL
ALT: 28 U/L (ref 0–35)
AST: 23 U/L (ref 0–37)
Alkaline Phosphatase: 96 U/L (ref 39–117)
CO2: 23 mEq/L (ref 19–32)
Calcium: 8.2 mg/dL — ABNORMAL LOW (ref 8.4–10.5)
Chloride: 101 mEq/L (ref 96–112)
GFR calc Af Amer: 29 mL/min — ABNORMAL LOW (ref >60)
GFR calc non Af Amer: 24 mL/min — ABNORMAL LOW (ref >60)
Potassium: 3.7 mEq/L (ref 3.5–5.1)
Sodium: 134 mEq/L — ABNORMAL LOW (ref 135–145)
Total Bilirubin: 1.7 mg/dL — ABNORMAL HIGH (ref 0.3–1.2)

## 2010-09-07 LAB — URINE MICROSCOPIC-ADD ON

## 2010-09-07 LAB — DIFFERENTIAL
Basophils Absolute: 0 10*3/uL (ref 0.0–0.1)
Eosinophils Absolute: 0.1 10*3/uL (ref 0.0–0.7)
Eosinophils Relative: 1 % (ref 0–5)
Lymphocytes Relative: 6 % — ABNORMAL LOW (ref 12–46)
Lymphs Abs: 0.3 10*3/uL — ABNORMAL LOW (ref 0.7–4.0)
Monocytes Absolute: 1.1 10*3/uL — ABNORMAL HIGH (ref 0.1–1.0)
Monocytes Relative: 11 % (ref 3–12)
Neutro Abs: 8.6 10*3/uL — ABNORMAL HIGH (ref 1.7–7.7)

## 2010-09-07 LAB — URINALYSIS, ROUTINE W REFLEX MICROSCOPIC
Ketones, ur: NEGATIVE mg/dL
Nitrite: NEGATIVE
pH: 5.5 (ref 5.0–8.0)

## 2010-09-07 LAB — BASIC METABOLIC PANEL
BUN: 18 mg/dL (ref 6–23)
CO2: 23 mEq/L (ref 19–32)
Calcium: 7.5 mg/dL — ABNORMAL LOW (ref 8.4–10.5)
Chloride: 110 mEq/L (ref 96–112)
GFR calc Af Amer: 43 mL/min — ABNORMAL LOW (ref >60)
GFR calc non Af Amer: 34 mL/min — ABNORMAL LOW (ref >60)
GFR calc non Af Amer: 36 mL/min — ABNORMAL LOW (ref >60)
Glucose, Bld: 122 mg/dL — ABNORMAL HIGH (ref 70–99)
Potassium: 3.7 mEq/L (ref 3.5–5.1)
Potassium: 3.7 mEq/L (ref 3.5–5.1)
Sodium: 136 mEq/L (ref 135–145)
Sodium: 138 mEq/L (ref 135–145)
Sodium: 138 mEq/L (ref 135–145)

## 2010-09-07 LAB — CLOSTRIDIUM DIFFICILE EIA: C difficile Toxins A+B, EIA: NEGATIVE

## 2010-09-07 LAB — TROPONIN I: Troponin I: 0.03 ng/mL (ref 0.00–0.06)

## 2010-09-07 LAB — FECAL LACTOFERRIN, QUANT: Fecal Lactoferrin: NEGATIVE

## 2010-09-09 ENCOUNTER — Encounter: Payer: Self-pay | Admitting: *Deleted

## 2010-09-22 LAB — URINE MICROSCOPIC-ADD ON

## 2010-09-22 LAB — URINALYSIS, ROUTINE W REFLEX MICROSCOPIC
Glucose, UA: NEGATIVE mg/dL
Hgb urine dipstick: NEGATIVE
Specific Gravity, Urine: 1.017 (ref 1.005–1.030)
Urobilinogen, UA: 0.2 mg/dL (ref 0.0–1.0)

## 2010-09-22 LAB — GLUCOSE, CAPILLARY
Glucose-Capillary: 123 mg/dL — ABNORMAL HIGH (ref 70–99)
Glucose-Capillary: 126 mg/dL — ABNORMAL HIGH (ref 70–99)
Glucose-Capillary: 144 mg/dL — ABNORMAL HIGH (ref 70–99)
Glucose-Capillary: 147 mg/dL — ABNORMAL HIGH (ref 70–99)
Glucose-Capillary: 78 mg/dL (ref 70–99)

## 2010-09-22 LAB — PROTIME-INR
INR: 1.47 (ref 0.00–1.49)
Prothrombin Time: 17.7 seconds — ABNORMAL HIGH (ref 11.6–15.2)

## 2010-09-22 LAB — BASIC METABOLIC PANEL
BUN: 8 mg/dL (ref 6–23)
CO2: 24 mEq/L (ref 19–32)
Calcium: 8.6 mg/dL (ref 8.4–10.5)
Chloride: 101 mEq/L (ref 96–112)
Creatinine, Ser: 0.8 mg/dL (ref 0.4–1.2)
Creatinine, Ser: 0.96 mg/dL (ref 0.4–1.2)
GFR calc Af Amer: >60 mL/min (ref >60)
GFR calc non Af Amer: >60 mL/min (ref >60)
Potassium: 4.1 mEq/L (ref 3.5–5.1)
Potassium: 4.6 mEq/L (ref 3.5–5.1)

## 2010-09-22 LAB — TYPE AND SCREEN
ABO/RH(D): O NEG
Antibody Screen: NEGATIVE

## 2010-09-22 LAB — COMPREHENSIVE METABOLIC PANEL
ALT: 34 U/L (ref 0–35)
AST: 25 U/L (ref 0–37)
Albumin: 3.8 g/dL (ref 3.5–5.2)
CO2: 27 mEq/L (ref 19–32)
Calcium: 9.2 mg/dL (ref 8.4–10.5)
GFR calc Af Amer: >60 mL/min (ref >60)
GFR calc non Af Amer: >60 mL/min (ref >60)
Sodium: 138 mEq/L (ref 135–145)
Total Protein: 6.7 g/dL (ref 6.0–8.3)

## 2010-09-22 LAB — CBC
HCT: 28.4 % — ABNORMAL LOW (ref 36.0–46.0)
HCT: 31.7 % — ABNORMAL LOW (ref 36.0–46.0)
Hemoglobin: 10.7 g/dL — ABNORMAL LOW (ref 12.0–15.0)
Hemoglobin: 9.7 g/dL — ABNORMAL LOW (ref 12.0–15.0)
MCHC: 33.6 g/dL (ref 30.0–36.0)
MCHC: 34.3 g/dL (ref 30.0–36.0)
MCHC: 34.8 g/dL (ref 30.0–36.0)
MCV: 94.4 fL (ref 78.0–100.0)
Platelets: 187 10*3/uL (ref 150–400)
Platelets: 215 10*3/uL (ref 150–400)
RBC: 3.36 MIL/uL — ABNORMAL LOW (ref 3.87–5.11)
RBC: 4.37 MIL/uL (ref 3.87–5.11)
RDW: 14.4 % (ref 11.5–15.5)
WBC: 6.3 10*3/uL (ref 4.0–10.5)
WBC: 8.3 10*3/uL (ref 4.0–10.5)

## 2010-09-22 LAB — APTT: aPTT: 29 seconds (ref 24–37)

## 2010-11-02 NOTE — H&P (Signed)
NAMECLEMENCIA, Olson NO.:  [id number]   MEDICAL RECORD NO.:  [id number]          PATIENT TYPE:  INP   LOCATION:  1525                         FACILITY:  [facility]   PHYSICIAN:  Ollen Gross, M.D.    DATE OF BIRTH:  1940-12-06   DATE OF ADMISSION:  01/17/2007  DATE OF DISCHARGE:                              HISTORY & PHYSICAL   CHIEF COMPLAINT:  Left knee pain.   HISTORY OF PRESENT ILLNESS:  The patient is a 70 year old female who has  been seen by Dr. Lequita Halt for ongoing left knee pain.  She had initial  evaluation back in August of 2006 and underwent injections at that time  which only provided minimal benefit.  She developed lymphoma last year  and had to be treated for this.  She has reached a point now where her  knees are preventing her from doing anything she would like to do.  She  has gone through her treatment and is now disease-free from her non-  Hodgkin's lymphoma.  She had a recent PET scan.  It is felt that she has  reached a point that she can benefit from undergoing surgical  intervention.  Her x-rays do show end-stage bone-on-bone arthritis.  The  risks and benefits have been discussed, and she elects to proceed with  surgery.  She has been seen preoperatively by Dr. Elias Else and felt  she is medically clear for upcoming surgery.   ALLERGIES:  NO KNOWN DRUG ALLERGIES.   CURRENT MEDICATIONS:  Lyrica, Cymbalta, Micardis, atenolol, potassium,  Nexium, simvastatin, Evista, calcium, Centrum Silver.   PAST MEDICAL HISTORY:  1. Fibromyalgia.  2. History of bronchitis.  3. Hypercholesterolemia.  4. Hypertension.  5. Reflux disease.  6. History of anemia.  7. History of non-Hodgkin's lymphoma.   PAST SURGICAL HISTORY:  1. Appendectomy.  2. Gallbladder surgery.  3. Tubal ligation.  4. Heel surgery.  5. Knee surgery.  6. Rotator cuff surgery.  7. Recent upper endoscopy and recent colonoscopy.   SOCIAL HISTORY:  Married, retired.   Nonsmoker, no alcohol.  Six  children.  Husband and daughter will be assisting with care after  surgery.   FAMILY HISTORY:  Father deceased in his 77s with history of  hypertension, diabetes, brain cancer, and stroke.  Both parents had  arthritis.  Mother deceased in her 61s with history of heart disease,  hypertension, diabetes, and breast cancer.   REVIEW OF SYSTEMS:  GENERAL:  No fevers, chills, or night sweats.  NEUROLOGIC:  No seizures, syncope, or paralysis.  She does have  fibromyalgia.  RESPIRATORY:  She has shortness of breath on exertion.  No shortness of breath at rest.  No productive cough or hemoptysis.  CARDIOVASCULAR:  No chest pain, angina, or orthopnea.  GI:  No nausea,  vomiting, diarrhea, or constipation.  GU:  No dysuria or hematuria.  No  discharge.  MUSCULOSKELETAL:  Left knee.   PHYSICAL EXAMINATION:  VITAL SIGNS:  Pulse 64, respirations 12, blood  pressure 126/82.  GENERAL:  70 year old white female, well-developed, well-nourished, in  no acute distress.  NEUROLOGIC:  She is alert, oriented,  and cooperative.  Pleasant.  Good  historian.  HEENT:  Normocephalic, atraumatic.  Pupils are equal, round, and  reactive.  Oropharynx clear.  Tympanic membranes intact.  She does have  a permanent upper bridge with a lower partial plate.  NECK:  Supple.  CHEST:  Clear anterior and posterior chest.  No wheezing, rhonchi, or  rales.  HEART:  Regular rate and rhythm.  No murmurs.  S1 and S2 noted.  ABDOMEN:  Soft, nontender.  Bowel sounds present.  BREASTS/GENITALIA:  Not done, not pertinent to present illness.  EXTREMITIES:  Left knee with no effusion, tender both medial and  lateral, marked crepitus, range of motion 5 to 115.   IMPRESSION:  1. Osteoarthritis, left knee.  2. Fibromyalgia.  3. History of bronchitis.  4. Hypercholesterolemia.  5. Hypertension.  6. Reflux disease.  7. History of anemia.  8. History of non-Hodgkin's lymphoma.   PLAN:  The patient  will be admitted to [facility] to undergo a  left total knee replacement and arthroplasty.  Surgery will be performed  by Dr. Ollen Gross.      [name], P.A.C.      Ollen Gross, M.D.  Electronically Signed    ALP/MEDQ  D:  01/17/2007  T:  01/18/2007  Job:  [id number]   cc:   Ollen Gross, M.D.  Fax: [phone]   Elana Alm. Nicholos Johns, M.D.  Fax: [phone]   Lajuana Matte, MD  Fax: [phone]

## 2010-11-02 NOTE — Op Note (Signed)
[name], [name] NO.:  [id number]   MEDICAL RECORD NO.:  [id number]          PATIENT TYPE:  AMB   LOCATION:  [facility]                         FACILITY:  [facility]   PHYSICIAN:  Heloise Purpura, MD      DATE OF BIRTH:  1940-07-13   DATE OF PROCEDURE:  11/28/2007  DATE OF DISCHARGE:                               OPERATIVE REPORT   PREOPERATIVE DIAGNOSIS:  Right ureteral obstruction.   POSTOPERATIVE DIAGNOSIS:  Right ureteral obstruction.   PROCEDURES.:  1. Cystoscopy.  2. Right retrograde pyelography with interpretation.  3. Right ureteroscopy with brush biopsy.  4. Right ureteral stent placement (6 x 24).)   SURGEON:  Heloise Purpura, MD   ANESTHESIA:  General.   COMPLICATIONS:  None.   RADIOLOGIC IMAGING:  The patient did undergo a right retrograde  pyelogram.  This demonstrated an approximately 2-cm distal right  ureteral stricture with proximal dilation of the ureter and tortuosity  of the right ureteropelvic junction.  There were no obvious filling  defects and no clear ureteropelvic junction obstruction.  However, these  findings were consistent with a possible crossing vessel, a  ureteropelvic junction obstruction versus secondary tortuosity and  dilation related to the patient's the hydroureteronephrosis related to a  distal stricture.   INDICATIONS:  This lady is a 70 year old female with a history of  lymphoma, status post chemotherapy and is currently in complete  remission.  She has had known right-sided hydronephrosis initially  managed with double ureteral stents by Dr. Wilburn Mylar.  Her  stent has subsequently been removed and she was monitored after having  an excellent response to her  chemotherapy.  However, she has continued  to have what appears to be extrinsic obstruction of the distal ureter  and recently was found to have an increasing creatinine concerning for  development of renal insufficiency and failure.  After a discussion  regarding these findings and our management options, it was decided to  proceed with the above procedures.  Potential risks, complications, and  alternative options were discussed with the patient and informed consent  was obtained.   DESCRIPTION OF PROCEDURE:  The patient was taken to the operating room  and a general anesthetic was administered.  She was given preoperative  antibiotics, placed in the dorsal lithotomy position, and prepped and  draped in the usual sterile fashion.  Next, a preoperative time-out was  performed.  Cystourethroscopy was then performed which demonstrated a  normal urethra and bladder without evidence for bladder tumors, stones,  or other mucosal pathology.  The ureteral orifices were noted to be in  the normal anatomic position and effluxing clear urine.  The right  ureteral orifice was then intubated with a cone-tip catheter and  retrograde pyelography was performed with findings as dictated above.  A  0.038 Sensor guidewire was then inserted through the stricture and into  the right renal pelvis under fluoroscopic guidance without difficulty.  A 5-French ureteral catheter was then advanced over the wire and  contrast was injected to examine the remainder of the proximal ureter  and renal pelvis.  Again, findings are as dictated  above.  The wire was  left in place and the stent removed.  A semi-rigid ureteroscope was then  used to inspect the distal ureter.  The patient's stricture could not be  bypassed.  There was no evidence for a ureteral tumor and it was  obvious.  However, a brush biopsy of the strictured area was performed.  The  ureteroscope was then removed and the wire was back-loaded on the  cystoscope.  A 6 x 24 double-J ureteral stent was then advanced over the  wire using Seldinger technique and the wire was removed after the stent  had been positioned under fluoroscopic and cystoscopic guidance and was  appropriately positioned in the renal  pelvis in the bladder.  The  patient's bladder was emptied and the procedure was ended.  She appeared  to tolerate the procedure well without complications.  She was able to  be awakened and transferred to the recovery unit in satisfactory  condition.      Heloise Purpura, MD  Electronically Signed     LB/MEDQ  D:  11/28/2007  T:  11/28/2007  Job:  [id number]

## 2010-11-02 NOTE — Op Note (Signed)
[name], [name] NO.:  [id number]   MEDICAL RECORD NO.:  [id number]          PATIENT TYPE:  INP   LOCATION:  1525                         FACILITY:  [facility]   PHYSICIAN:  Ollen Gross, M.D.    DATE OF BIRTH:  Mar 27, 1941   DATE OF PROCEDURE:  01/17/2007  DATE OF DISCHARGE:                               OPERATIVE REPORT   PREOPERATIVE DIAGNOSIS:  Osteoarthritis, left knee.   POSTOPERATIVE DIAGNOSIS:  Osteoarthritis, left knee.   PROCEDURE:  Left total knee arthroplasty.   SURGEON:  Ollen Gross, M.D.   ASSISTANT:  Alexzandrew L. Perkins, P.A.C.   ANESTHESIA:  Spinal.   ESTIMATED BLOOD LOSS:  Minimal.   DRAINS:  None.   TOURNIQUET TIME:  32 minutes at 300 mmHg.   COMPLICATIONS:  None.   CONDITION.:  Stable to recovery.   CLINICAL NOTE:  [name] is a 70 year old female with end stage  arthritis of the left knee with progressively worsening pain and  dysfunction.  She has failed nonoperative management including  injections and presents for total knee arthroplasty.   PROCEDURE IN DETAIL:  After successful administration of spinal  anesthetic, a tourniquet is placed high on the left thigh and the left  lower extremity is prepped and draped in the usual sterile fashion.  The  extremity is wrapped in an Esmarch, knee flexed, tourniquet inflated to  300 mmHg.  A midline incision is made with a 10 blade through  subcutaneous tissue to the level of the extensor mechanism.  A fresh  blade is used make a medial parapatellar arthrotomy.  Soft tissue of the  proximal and medial tibia is subperiosteally elevated to the joint line  with the knife and into the semimembranosus bursa with a Cobb elevator.  Soft tissue laterally is elevated with attention being paid to avoid the  patellar tendon on the tibial tubercle.  The patella was everted, knee  flexed 90 degrees. ACL and PCL were removed.  A drill was used to create  a starting hole in the distal femur  and the canal was thoroughly  irrigated.  The 5 degrees left valgus alignment guide was placed  referencing off the posterior condyles, rotation is marked on the block  to remove 10 mm off the distal femur.  Distal femoral resection is made  with an oscillating saw.  Sizing block is placed, a 2.5 is the most  appropriate.  Rotation is marked off the epicondylar axis and a size 2.5  cutting block is placed.  The anterior, posterior, and chamfer cuts are  made.   The tibia is subluxed forward and the menisci are removed.  Extramedullary tibial alignment guide is placed referencing proximally  the medial aspect of the tibial tubercle and distally along the second  metatarsal axis and tibial crest.  Blocks were pinned to remove about 4  mm off the more deficient medial side.  Tibial resection is made with an  oscillating saw.  2.5 is the most appropriate tibial component and the  proximal tibia is prepared with the modular drill and keel punch for the  size 2.5.  Femoral preparation  is completed with the intercondylar cut.   Size 2.5 mobile bearing tibial trial, 2.5 posterior stabilized femoral  trial, and a 10 mm posterior stabilized rotating platform insert trial  are placed.  With the 10, there is slight hyperextension, with the 12.5  which allowed for full extension with excellent varus valgus balance  throughout full range of motion.  The patella was everted and thickness  measured to be 20 mm.  Freehand resection was taken to 12 mm, 35  template is placed, lug holes were drilled, trial patella was placed and  it tracks normally.  Osteophytes are removed off the posterior femur  with the trial in place.  All trials are removed and the cut bone  surfaces are prepared with pulsatile lavage.  The cement was mixed and  once ready for implantation, the size 2.5 mobile bearing tibial tray,  2.5 posterior stabilized femur and 35 patella are cemented in place.  The patella was held with a  clamp.  The trial 12.5 mm insert was placed  and the knee held in full extension.  All extruded cement was removed.  Once the cement had fully hardened, then the permanent 12.5 mm posterior  stabilized rotating platform insert is placed into the tibial tray.   The wound was copiously irrigated with saline solution and FloSeal was  placed.  The tourniquet was released with a total time of 32 minutes.  Minor bleeding was stopped with cautery.  The extensor mechanism was  closed with interrupted #1 PDS in flexion against gravity to 135  degrees.  The subcu was closed with interrupted 2-0 Vicryl and  subcuticular running 4-0 Monocryl.  The incision was cleaned and dried  and Steri-Strips and a bulky sterile dressing applied.  She is then  placed into a knee immobilizer, awakened, and transferred to the  recovery room in stable condition.      Ollen Gross, M.D.  Electronically Signed     FA/MEDQ  D:  01/17/2007  T:  01/17/2007  Job:  [id number]

## 2010-11-02 NOTE — Op Note (Signed)
NAMETASMIA, BLUMER NO.:  [id number]   MEDICAL RECORD NO.:  [id number]          PATIENT TYPE:  INP   LOCATION:  1434                         FACILITY:  [facility]   PHYSICIAN:  Heloise Purpura, MD      DATE OF BIRTH:  Feb 15, 1941   DATE OF PROCEDURE:  03/03/2008  DATE OF DISCHARGE:                               OPERATIVE REPORT   PREOPERATIVE DIAGNOSES:  1. Right ureteral obstruction.  2. History of non-Hodgkin's lymphoma.   POSTOPERATIVE DIAGNOSES:  1. Right ureteral obstruction.  2. History of non-Hodgkin's lymphoma.   PROCEDURE:  1. Cystoscopy.  2. Right ureteral stent change (8 x 26).  3. Right robotic-assisted laparoscopic ureteral reimplantation with      psoas hitch.   SURGEON:  Dr. Heloise Purpura.   ASSISTANT:  Delia Chimes, nurse practitioner.   ANESTHESIA:  General.   COMPLICATIONS:  None.   ESTIMATED BLOOD LOSS:  50 mL.   INTRAVENOUS FLUIDS:  2000 mL of lactated Ringer's.   SPECIMENS:  Periureteral pelvic mass.   DISPOSITION:  Specimen to pathology.   DRAINS:  1. 16-French Foley catheter.  2. #19 Blake pelvic drain.   INDICATIONS:  Ms. Pinkett is a 70 year old female with a history of non-  Hodgkin's lymphoma.  She was noted to initially have a pelvic mass and  underwent treatment with chemotherapy.  She had an excellent response  and has been felt to be free of disease recurrence for over 1 year.  However, she did have a residual fibrotic pelvic mass in the vicinity of  the distal right ureter.  This was not felt to represent malignancy and  has been unchanged for over 1 year.  After a discussion with her  oncologist, she was felt to be free of disease and a good candidate for  definitive reconstruction of her urinary tract.  Of note, she had been  followed and appeared to have a partial obstruction of the right ureter  but recently had developed worsening renal function which subsequently  improved after ureteral stent placement,  indicating worsening  obstruction that was clinically significant.  We discussed the potential  options for management.  She elected to proceed with the above  procedures.  Potential risks, complications, and alternative treatment  options were discussed in detail and informed consent was obtained.   DESCRIPTION OF PROCEDURE:  The patient was taken to the operating room  and a general anesthetic was administered.  She was given preoperative  antibiotics, placed in the dorsal lithotomy position, and prepped and  draped in the usual sterile fashion.  Next a preoperative time-out was  performed.  Cystourethroscopy was then performed which allowed  identification of the right ureteral stent.  This was brought out  through the urethral meatus and a 0.038 sensor guidewire was advanced up  the stent and into the right renal pelvis under fluoroscopic guidance.  The stent was then removed and the wire was back loaded over the  cystoscope sheath.  A new 8 x 26 double-J ureteral stent was then  advanced over the wire using Seldinger technique and appropriately  positioned under fluoroscopic and  cystoscopic guidance.  The wire was  then removed, with a good curl noted in the renal pelvis as well as in  the bladder.  The patient's bladder was then emptied and she was then  repositioned in low lithotomy position and steep Trendelenburg.  Her  abdomen and genitalia were then reprepped and draped in the usual  sterile fashion.  A new 16-French catheter was then placed on the  operative field to drain the bladder.  A site was selected just superior  to the umbilicus for placement of the camera port.  This was placed  using a standard open Hasson technique which allowed entry into the  peritoneal cavity under direct vision and without difficulty.  A 12-mm  port was then placed and a pneumoperitoneum established.  The 0-degree  lens was used to inspect the abdomen and there was no evidence for any   intra-abdominal injuries or other abnormalities.  The remaining ports  were then placed.  Bilateral 8-mm robotic ports were placed 10 cm  lateral to and just inferior to the camera port site.  An additional 8-  mm robotic port was placed in the far left lateral abdominal wall.  A 5-  mm port was placed between the camera port and the right robotic port.  An additional 12-mm port was placed in the far right lateral abdominal  wall for laparoscopic assistance.  All ports were placed under direct  vision and without difficulty.  The surgical cart was then docked.  With  the aid of the cautery scissors and bipolar Maryland forceps, the ureter  was identified in the right lower quadrant and the peritoneum overlying  the ureter was incised, allowing the ureter to be encircled.  It was  noted to be severely dilated and inflamed.  Dissection then proceeded  inferiorly over the common iliac artery and into the pelvis.  The  external iliac artery and internal iliac artery were identified and  separated off the ureter.  Any small bleeding vessels were controlled  with bipolar cautery or Hem-o-lok clips.  The ureter was then seen to be  entering into the dense fibrotic mass that had been appreciated on her  imaging.  The ureter was separated down to this mass, where it was  divided.  The ureter was also freed proximally above the psoas muscle  and when appropriate length was created, the ureter was laid into the  right lower quadrant of the abdomen.  Attention then returned to the  pelvis and an excisional biopsy of the patient's pelvic mass was  performed and sent for permanent pathologic analysis.  Hemostasis was  ensured with a combination of cautery and Hem-o-lok clips.  The bladder  was reflected posteriorly and completely mobilized down to the  endopelvic fascia bilaterally.  In addition, the peritoneal attachments  were taken down on the left side of the bladder.  The patient was noted  to have  a large capacity bladder and it did not appear that her superior  vesical artery on the left side needed to be divided.  The peritoneum on  the right side was also taken down, allowing the bladder to be well  mobilized.  At this point the bladder was examined and brought up into  the right lower quadrant.  It appeared that there was plenty of length  for a psoas hitch procedure.  Based on the length of the ureter, this  appeared to be an appropriate form of reconstruction for her ureter.  Therefore 2 figure-of-eight 2-0 Vicryl sutures were placed into the  psoas fascia.  The sutures were placed in the direction of the muscle  fibers and were then used to tack the bladder down securely.  The  bladder was then filled with saline and a site was selected on the right  lateral aspect of the bladder for reimplantation.  A 2-0 Vicryl holding  stitch was placed into the bladder and an incision was made over the  detrusor muscle.  It was decided to proceed with a non refluxing  anastomosis that would be widely patent to minimize the risk of  recurrent obstruction.  The patient's bladder was then opened and the  ventral aspect of the ureter was spatulated widely.  At this point 4-0  Vicryl sutures were placed at the proximal and distal aspects of the  bladder opening and interrupted sutures were used to reapproximate the  corresponding areas of the ureter in preparation for the ureteral  anastomosis.  The ureteral stent was also placed back into the bladder  and then running 4-0 Vicryl sutures were placed to close both the right  and left aspects of the vesicoureteral anastomosis.  The perivesical fat  overlying the anastomosis was then reapproximated to help provide an  additional layer.  Once the anastomosis was complete, the bladder was  then filled with saline and there did not appear to be any evidence for  leak.  A #19 Blake drain was brought through the left robotic port and  was  appropriately positioned in the pelvis.  It was secured to the skin  with a nylon suture.  The surgical cart was then undocked.  The right  lateral 12-mm port site was closed with a 0 Vicryl suture.  There was  noted to be a small amount of bleeding from this port site, which was  controlled with this stitch.  All remaining ports were then removed  under direct vision.  After the 5-mm port was removed, there was noted  be some bleeding from this side as well.  Therefore an additional 0  Vicryl suture was placed with the aid of the suture passer device and  did control this bleeding site.  The pneumoperitoneum was expelled and  the camera port was removed.  This fascial opening was then closed with  a running 0 Vicryl suture.  All port sites were injected with 0.25%  Marcaine and reapproximated at the skin level with staples.  Sterile  dressings were applied.  She appeared to tolerate the procedure well  without complications.  She was able to be awakened and transferred to  the recovery unit in satisfactory condition.      Heloise Purpura, MD  Electronically Signed     LB/MEDQ  D:  03/03/2008  T:  03/04/2008  Job:  [id number]

## 2010-11-02 NOTE — Discharge Summary (Signed)
[name], ROTUNDO NO.:  [id number]   MEDICAL RECORD NO.:  [id number]          PATIENT TYPE:  INP   LOCATION:  1434                         FACILITY:  [facility]   PHYSICIAN:  Heloise Purpura, MD      DATE OF BIRTH:  May 11, 1941   DATE OF ADMISSION:  03/03/2008  DATE OF DISCHARGE:  03/05/2008                               DISCHARGE SUMMARY   PREOPERATIVE DIAGNOSES:  1. Right ureteral obstruction.  2. History of lymphoma.   DISCHARGE DIAGNOSES:  1. Right ureteral obstruction.  2. History of lymphoma.   PROCEDURES:  1. Cystoscopy.  2. Right ureteral stent placement.  3. Right robotic assisted laparoscopic ureteral reimplantation.   HISTORY AND PHYSICAL:  For full details, please see admission history  and physical.  Briefly, [name] is a 70 year old female with a  history of lymphoma and subsequent right ureteral obstruction, which  appears to be extrinsic, related to her history of lymphoma.  She had a  fibrotic mass in the pelvis that is not felt to be malignant, and has  been stable.  She, therefore, presented for definitive management of her  right ureteral obstruction with the above procedures.   HOSPITAL COURSE:  She was taken to the operating room on March 03, 2008 and underwent a right robotic-assisted laparoscopic ureteral  implantation.  She tolerated this procedure well without complications.  Postoperatively, she was able to be transferred to a regular hospital  room following recovery from anesthesia.  She remained hemodynamically  stable the evening of surgery and was able to begin ambulating.  Her  hemoglobin was stable the following morning, and her renal function  remained stable.  She maintained excellent urine output with minimal  output from her pelvic drain, and her drain fluid was sent for  creatinine on the morning of postoperative day 2 and found to be 0.9  consistent with serum.  Her drain was, therefore, removed.  She did  develop 1 episode where her catheter was not draining very well and did  require irrigation.  It subsequently drained well and with clear urine.  She was, therefore, felt ready to be discharged home on postoperative  day 2 after tolerating a regular diet and being transitioned to oral  pain medication.   DISPOSITION:  Home.   DISCHARGE MEDICATIONS:  She was instructed to resume her regular home  medications, except any aspirin, non-steroidal anti-inflammatory drugs,  or oral supplements.  She was given a prescription to take Vicodin as  needed for pain, and told to use Colace as a stool softener.  She was  also given a prescription to begin Cipro 1 day prior to her return visit  for Foley catheter removal.   DISCHARGE INSTRUCTIONS:  She was instructed to be ambulatory, but  specifically told to refrain from any heavy lifting, strenuous activity,  or driving.  She was instructed on routine Foley catheter care and also  instructed on how to irrigate her catheter as necessary.   FOLLOWUP:  She will return in 1 week for a cystogram and Foley catheter  removal.   ADDENDUM:  In addition, she  did undergo excisional biopsy of the pelvic  mass.  This demonstrated no evidence for malignancy or recurrent  lymphoma.      Heloise Purpura, MD  Electronically Signed     LB/MEDQ  D:  03/05/2008  T:  03/06/2008  Job:  [id number]

## 2010-11-05 NOTE — H&P (Signed)
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   HISTORY OF PRESENT ILLNESS:  The patient is a very pleasant 70 year old  female with past medical history significant for hypertension, high  cholesterol and fibromyalgia.  Most recently, she was found to have an  abdominal mass in the pelvic area.  She had a CT-guided needle core biopsy  performed on December 23, 2005.  The pathology revealed malignant cells  consistent with a high-grade B-cell lymphoma.  She received her first cycle  of chemotherapy by Dr. Alyson Locket; she received CHOP-R and tolerated  this quite well.  The patient subsequently transferred her care to the [facility] [place] for access closer to home.  The patient's last  chemotherapy, cycle length #2, was given on January 19, 2006, consisting of  again CHOP-R with day #2 Neulasta support.  On January 26, 2006, the patient  had interim labs performed including a CBC which revealed a WBC count of 0.5  with an absolute neutrophil count of 0, platelets of 123,000 and a  hemoglobin of 9.8.  The patient was placed on oral Avelox and neutropenia  precautions were discussed with her.  Earlier today, the patient's husband  called me stating that the patient did have a fever ranging anywhere between  101-102; she also had shaking chills as well as sweating.  She also  complained of being dizzy, weak, fatigued and just not able to function  much.  Because of this, the patient was directly admitted to the [facility]  oncology floor for febrile  neutropenia.  The patient currently does complain  of being very dizzy, tired, weak and fatigued.  She has no nausea.  She has  obviously had sweating and some shaking chills.  The patient did take  Tylenol at home.  She has not had any bleeding problems.  She does have some  abdominal pain since she has been taking Avelox.  She has no cough or  shortness of breath and no urinary symptoms.   PAST MEDICAL HISTORY:  1. High cholesterol.  2. Fibromyalgia.  3. Hypertension.   PAST SURGICAL HISTORY:  1. Appendectomy.  2. Cholecystectomy.  3. Rotator cuff surgery.  4. Bilateral renal stent placement.   FAMILY HISTORY:  Significant for uterine cancer and breast cancer diagnosed  in the patient's mother, father with brain cancer and a brother with heart  attacks.   SOCIAL HISTORY:  The patient is married; she lives with her husband.  She  has 6 children.  She is retired.  There is no history of alcohol or tobacco  use.   ALLERGIES:  No known drug allergies.  CURRENT MEDICATIONS AT HOME:  1. Ambien 10 mg nightly.  2. Calcium 600 mg daily.  3. Centrum one tab daily.  4. Cipro 500 mg twice a day.  5. Compazine 10 mg p.r.n.  6. Cymbalta 60 mg daily.  7. Darvocet one tab daily.  8. Trazodone hydrochloride 100 mg nightly.  9. Diflucan 100 mg daily.  10.Lotensin 20 mg daily.  11.Micardis 80/125 mg daily.  12.Nexium 60 mg daily.  13.Prednisone 100 mg daily.  14.Tenormin 100 mg.  15.Zocor 20 mg daily.  16.Zyloprim 300 mg daily.   REVIEW OF SYSTEMS:  As in the HPI.   PHYSICAL EXAMINATION:  GENERAL:  Tired-appearing female with profuse  sweating.  VITAL SIGNS:  Temperature 98.2, pulse 94, respirations 20, room air O2  saturations 97% , blood pressure 80/57.  HEENT:  EOMI.  PERRLA.  Oral mucosa is dry.  NECK:  Supple.  No palpable adenopathy.  LUNGS:  Clear bilaterally.  CARDIOVASCULAR:  Regular rate and rhythm, but tachycardic.  ABDOMEN:  Soft, nontender and non-distended.   Bowel sounds are present.  No  palpable masses.  EXTREMITIES:  No edema.  SKIN:  Warm and clammy.  Pulses are present.  Good capillary refill.  No  petechiae.   LABORATORY DATA:  CBC and CMET are pending.   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  The patient is now  being admitted with febrile neutropenia.   Plan is to panculture the patient including blood cultures, both peripheral  as well as central.  The patient will also receive intravenous antibiotics  consisting of Maxipime.  We will continue her home medications.  A chest x-  ray will also be obtained.  The patient is quite dizzy secondary to her very  low blood pressure.  All of her blood pressure  medications will be held and she will be hydrated with D-5 normal saline at  150 mL/hr.  The patient will also have a CBC performed today.  If she does  have a very low hemoglobin, she will be transfused with 2 units of packed  red cells.  All of this is discussed with the patient as well as her  daughter; they do understand.      Drue Second, MD  Electronically Signed     KK/MEDQ  D:  01/30/2006  T:  01/30/2006  Job:  [id number]   cc:   Alyson Locket MD   Skinner, Toni Amend, M.D.  Fax: [phone]   Lajuana Matte, MD  Fax: [phone]

## 2010-11-05 NOTE — Discharge Summary (Signed)
NAMEFLORIE, CARICO NO.:  [id number]   MEDICAL RECORD NO.:  [id number]          PATIENT TYPE:  INP   LOCATION:  1525                         FACILITY:  [facility]   PHYSICIAN:  Ollen Gross, M.D.    DATE OF BIRTH:  [birth year]   DATE OF ADMISSION:  01/17/2007  DATE OF DISCHARGE:  01/20/2007                               DISCHARGE SUMMARY   ADMITTING DIAGNOSES:  1. Osteoarthritis, left knee.  2. Fibromyalgia.  3. History of bronchitis.  4. Hypercholesterolemia.  5. Hypertension.  6. Reflux disease.  7. History of anemia.  8. History of non-Hodgkin's lymphoma.   DISCHARGE DIAGNOSES:  1. Osteoarthritis, left knee, status post left total knee      arthroplasty.  2. Mild postop blood loss anemia.  Did not require transfusion.  3. Fibromyalgia.  4. History of bronchitis.  5. Hypercholesterolemia.  6. Hypertension.  7. Reflux disease.  8. History of anemia.  9. History of non-Hodgkin's lymphoma.   PROCEDURE:  January 17, 2007, left total knee.  Surgeon:  Dr. Lequita Halt.  Assistant: Patrica Duel, PA-C.  Anesthesia:  Spinal.   CONSULTANTS:  None.   BRIEF HISTORY:  Ms. Tuller is a 70 year old female with end-stage  arthritis of the left knee with progressive worsening pain and  dysfunction; failed nonoperative management, including injections, and  now presents for a total knee arthroplasty.   LABORATORY DATA:  Preop CBC showed a hemoglobin of 13.3, hematocrit  38.3, white cell count 5.9.  postop hemoglobin 10.6 and drifted down to  10.3.  Last H&H 9.5 and 27.4.  PT/PTT, preop, 13.3 and 26, respectively.  INR 1.0.  Serial pro times followed.  Last PT/INR 21.2 and 1.8.  Chem  panel on admission:  Minimally elevated ALT of 36.  Remaining Chem panel  all within normal limits.  Serial BMETs were followed.  Electrolytes  remained within normal limits.   EKG dated January 15, 2007:  Normal sinus rhythm, minimal voltage criteria  for LVH, may be normal variant,  abnormal unconfirmed.   HOSPITAL COURSE:  The patient was admitted to [facility],  tolerated the procedure well and later transferred to the recovery room  and then orthopedic floor; start on PCA and p.o. analgesics for pain  control following surgery.  Did have some problems with blood pressure  through the night.  On morning rounds, the blood pressure was normal.  We are going to hold her blood pressure meds with parameters written.  Hemoglobin was 10.6.  Did have some pain, encouraged to use pills.  She  was started on p.o. and PCA analgesics.  Encouraged pills with PCA  backup.  Resumed her Nexium for reflux.  Started getting up out of bed  with therapy.   By day 2, was doing a little bit better, although not getting much rest;  being weaned over to p.o. meds.  Hemoglobin was down to 10.3.  Incision  looked good after the dressing change, no signs of infection.  Started  again with physical therapy and actually did very well, walked about 65  feet with minimal assistance.  Continued to progress well  with physical  therapy and was up ambulating independently over 70 feet by day 3.  Incision looked good; tolerating meds; pain under good control and was  discharged home.   DISCHARGE PLAN:  1. Patient was discharged home on January 20, 2007.  2. Discharge diagnosis:  Please see above.  3. Discharge meds  Percocet, Robaxin, and Coumadin.  4. Diet:  Resume home diet, low cholesterol, heart healthy.  5. Follow up in 2 weeks.  6. Activity:  Home health PT, home health nursing; total knee      protocol, weightbearing as tolerated.   DISPOSITION:  Home.   CONDITION ON DISCHARGE:  Improved.      Alexzandrew L. Perkins, P.A.C.      Ollen Gross, M.D.  Electronically Signed    ALP/MEDQ  D:  02/22/2007  T:  02/22/2007  Job:  [id number]   cc:   Ollen Gross, M.D.  Fax: [phone]   Elana Alm. Nicholos Johns, M.D.  Fax: [phone]   Lajuana Matte, MD  Fax: [phone]

## 2010-11-05 NOTE — Op Note (Signed)
[place]  Patient:    [name], [name] Visit Number: [id number] MRN: [id number]          Service Type: END Location: ENDO Attending Physician:  Charna Elizabeth Dictated by:   Anselmo Rod, M.D. Proc. Date: 09/06/01 Admit Date:  09/06/2001 Discharge Date: 09/06/2001   CC:         Elias Else, M.D.   Operative Report  PROCEDURE:  Colonoscopy with biopsies.  INSTRUMENT:  Olympus video endoscope.  INDICATIONS:  A 70 year-old white female with a history of 6-7 bowel movements per day and blood in stools, rule out colonic polyps, masses, hemorrhoids, etc.  Previously the preparation and informed consent was recurred from the patient. The patient was fasted for 8 hours prior to the procedure and prepped with a bottle of Magnesium Citrate and a gallon of Nu-LYTELY the night prior to the procedure.  PREPROCEDURE PHYSICAL:  VITAL SIGNS:  The patient had stable vital signs. NECK:  Supple.  CHEST:  Clear to auscultation.  ABDOMEN:  Soft with normal bowel sounds.  DESCRIPTION OF PROCEDURE:  The patient was placed in left lateral decubitus position and sedated with 50 mg of Demerol and 5 mg of Versed intravenously. Once the patient is adequately sedated and maintained on low flow oxygen and continuous cardiac monitoring, the Olympus video colonoscope was advanced from the rectum to the cecum without difficulty.  The entire colonic mucosa and the terminal ileum appeared normal without lesions.  No masses, polyps, erosions, ulcerations, or diverticula were identified.  Random biopsies were done from the colon to rule out presence of collagenous versus microscopic colitis. There were small internal hemorrhoids appreciated on retroflexion.  IMPRESSION: 1.  Healthy appearing colon to the terminal ileum.  Random biopsies done. 2.  Small nonbleeding internal hemorrhoid.  RECOMMENDATIONS: 1.  Await pathology results. 2.  Continuous on high fiber  diet. 3.  Outpatient follow-up in the next 2 weeks for further recommendations. Dictated by:   Anselmo Rod, M.D. Attending Physician:  Charna Elizabeth DD:  09/06/01 TD:  09/07/01 Job: [id number]

## 2010-12-11 IMAGING — PT NM PET TUM IMG RESTAG (PS) SKULL BASE T - THIGH
6 series · 25 of 25 positions shown · non-contrast
Comparison: PET CT 12/15/2006, CT abdomen/pelvis 11/11/2009, chest
CT 11/05/2009

CLINICAL DATA: Subsequent treatment strategy for non-Hodgkins
lymphoma.  Recent right renal biopsy demonstrating
inflammation/fibrosis.

NUCLEAR MEDICINE PET CT RESTAGING (PS) SKULL BASE TO THIGH
TECHNIQUE: 17.9 mCi F-18 FDG was injected intravenously via the
right wrist IV site.  Full-ring PET imaging was performed from the
skull base through the mid-thighs 65  minutes after injection.  CT
data was obtained and used for attenuation correction and anatomic
localization only.  (This was not acquired as a diagnostic CT
examination.)
Fasting Blood Glucose:  125

[Series 1: pet ac · axial · 3.3mm · 4.69mm/px · z∈[-731,-5]mm · 5 of 223 slices shown]
[im 1/223]
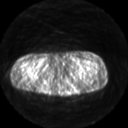
[im 56/223]
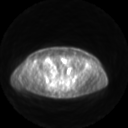
[im 112/223]
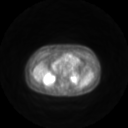
[im 167/223]
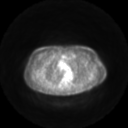
[im 223/223]
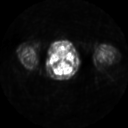

[Series 2: ct images · axial · 3.8mm · 0.98mm/px · z∈[-731,-5]mm · 5 of 223 slices shown]
[im 1/223]
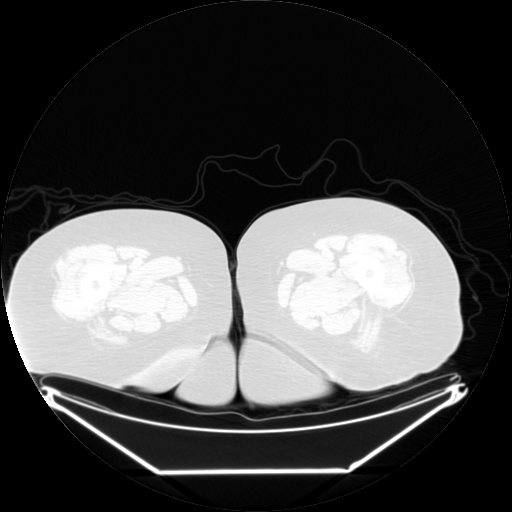
[im 56/223]
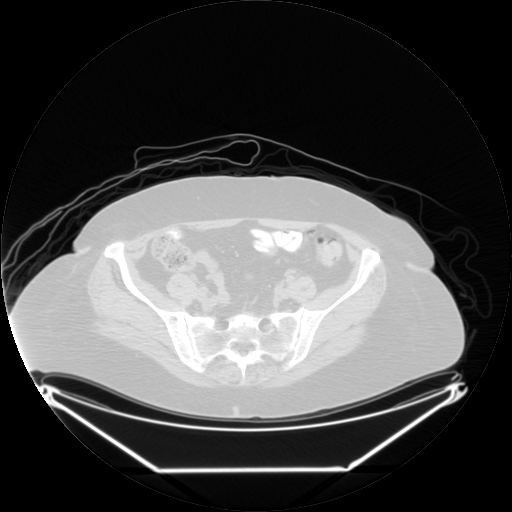
[im 112/223]
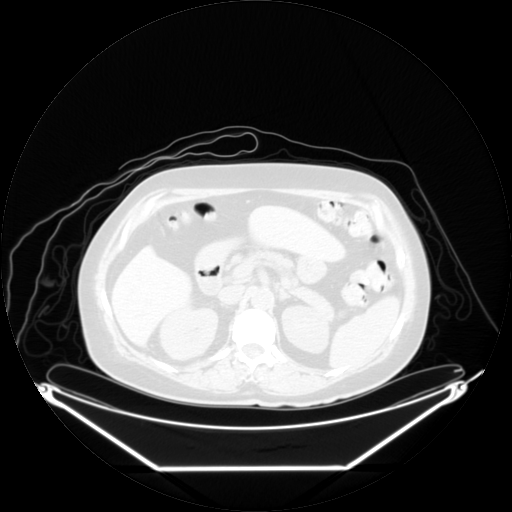
[im 167/223]
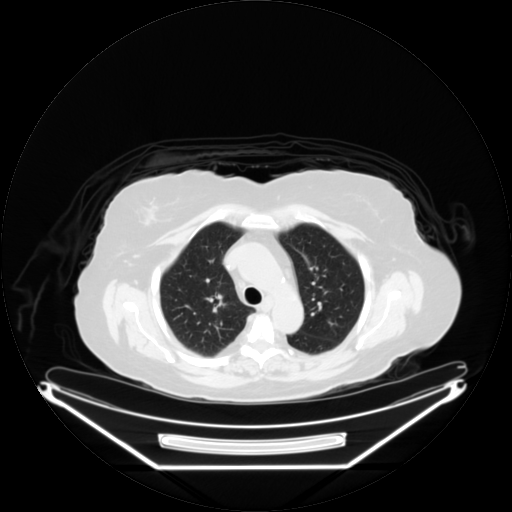
[im 223/223  brain]
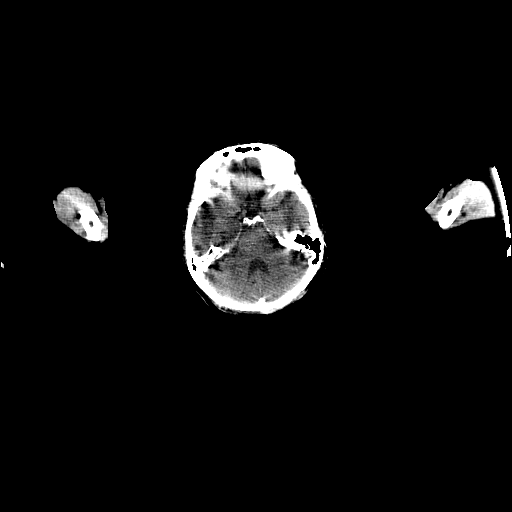

[Series 2: pet nac · axial · 3.3mm · 4.69mm/px · z∈[-731,-5]mm · 6 of 223 slices shown]
[im 1/223]
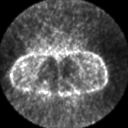
[im 45/223]
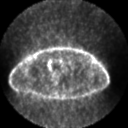
[im 89/223]
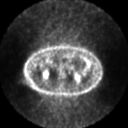
[im 134/223]
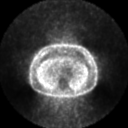
[im 178/223]
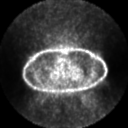
[im 223/223]
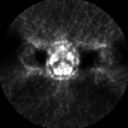

[Series 123: mip · coronal · 3.3mm · 4.69mm/px · 1 of 30 slices shown]
[im 1/30]
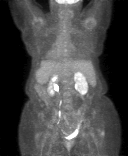

[Series 151: reformatted · axial · 3.3mm · 3.91mm/px · z∈[-731,-5]mm · 6 of 223 slices shown (1 of 2)]
[im 1/223]
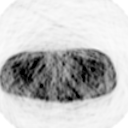
[im 45/223]
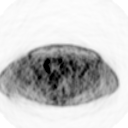
[im 89/223]
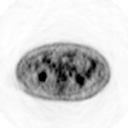
[im 134/223]
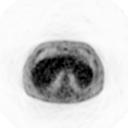
[im 178/223]
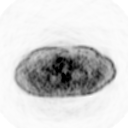
[im 223/223]
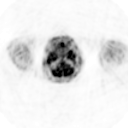

[Series 153: reformatted · coronal · 4.7mm · 5.83mm/px · 2 of 63 slices shown (2 of 2)]
[im 1/63]
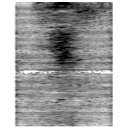
[im 63/63]
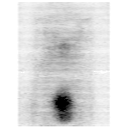

[25 of 25 positions shown; findings below may reference images not displayed]

FINDINGS: There is diffuse right renal enlargement, minimal
perinephric stranding, and globally increased parenchymal F D G
uptake separate from the collecting system.  Representative S U V
maximum measures 7.7.  No other area of abnormal F D G uptake is
noted in the neck, chest, abdomen, or pelvis.

Review of CT images is as per recent prior diagnostic examinations.

Physiologic uptake is noted in the nondilated renal collecting
systems, base of brain.
IMPRESSION: Diffusely increased FDG uptake within the right renal parenchyma.
Differential considerations remain infection/inflammation versus
neoplastic involvement.  Follow-up is as per recommendations from
prior biopsy results.  No evidence for recurrence elsewhere.

## 2011-03-16 LAB — CBC
MCV: 91.6
Platelets: 232
WBC: 5.2

## 2011-03-17 LAB — POCT I-STAT 4, (NA,K, GLUC, HGB,HCT)
Glucose, Bld: 110 — ABNORMAL HIGH
Operator id: 268271
Potassium: 3.7

## 2011-03-21 LAB — BASIC METABOLIC PANEL
BUN: 13
Calcium: 8.3 — ABNORMAL LOW
Creatinine, Ser: 0.9
GFR calc non Af Amer: >60

## 2011-03-21 LAB — GLUCOSE, CAPILLARY
Glucose-Capillary: 157 — ABNORMAL HIGH
Glucose-Capillary: 84
Glucose-Capillary: 89

## 2011-03-21 LAB — CREATININE, FLUID (PLEURAL, PERITONEAL, JP DRAINAGE)

## 2011-03-21 LAB — HEMOGLOBIN AND HEMATOCRIT, BLOOD: Hemoglobin: 11.8 — ABNORMAL LOW

## 2011-03-23 LAB — BASIC METABOLIC PANEL
BUN: 12
BUN: 14
CO2: 26
Creatinine, Ser: 0.8
GFR calc non Af Amer: >60
GFR calc non Af Amer: >60
Glucose, Bld: 143 — ABNORMAL HIGH
Glucose, Bld: 98
Potassium: 4
Sodium: 139

## 2011-03-23 LAB — GLUCOSE, CAPILLARY
Glucose-Capillary: 118 — ABNORMAL HIGH
Glucose-Capillary: 169 — ABNORMAL HIGH
Glucose-Capillary: 172 — ABNORMAL HIGH

## 2011-03-23 LAB — CBC
MCV: 92
Platelets: 256
RDW: 13.7
WBC: 6.2

## 2011-03-23 LAB — TYPE AND SCREEN: ABO/RH(D): O NEG

## 2011-04-04 LAB — BASIC METABOLIC PANEL
BUN: 9
CO2: 30
CO2: 26
Calcium: 8.5
Calcium: 8.3 — ABNORMAL LOW
Chloride: 102
Creatinine, Ser: 0.85
Creatinine, Ser: 0.79
GFR calc Af Amer: >60
GFR calc Af Amer: >60
GFR calc Af Amer: >60
GFR calc non Af Amer: >60
GFR calc non Af Amer: >60
Potassium: 3.7
Sodium: 136
Sodium: 139

## 2011-04-04 LAB — URINALYSIS, ROUTINE W REFLEX MICROSCOPIC
Glucose, UA: NEGATIVE
pH: 6

## 2011-04-04 LAB — URINE MICROSCOPIC-ADD ON

## 2011-04-04 LAB — CBC
HCT: 27.4 — ABNORMAL LOW
Hemoglobin: 10.3 — ABNORMAL LOW
Hemoglobin: 13.3
MCHC: 35.3
MCHC: 35.7
MCV: 90.1
MCV: 90.3
Platelets: 202
Platelets: 194
RBC: 3.04 — ABNORMAL LOW
RBC: 3.25 — ABNORMAL LOW
RBC: 3.3 — ABNORMAL LOW
RBC: 4.24
WBC: 7.1
WBC: 7.6
WBC: 5.9
WBC: 8.3

## 2011-04-04 LAB — COMPREHENSIVE METABOLIC PANEL
ALT: 36 — ABNORMAL HIGH
AST: 26
CO2: 25
Chloride: 102
GFR calc Af Amer: >60
GFR calc non Af Amer: >60
Glucose, Bld: 141 — ABNORMAL HIGH
Sodium: 138
Total Bilirubin: 1.2

## 2011-04-04 LAB — PROTIME-INR
INR: 1.5
INR: 1.1
Prothrombin Time: 18.4 — ABNORMAL HIGH
Prothrombin Time: 14

## 2011-09-19 DIAGNOSIS — N390 Urinary tract infection, site not specified: Secondary | ICD-10-CM

## 2011-09-19 HISTORY — DX: Urinary tract infection, site not specified: N39.0

## 2011-09-26 ENCOUNTER — Encounter (HOSPITAL_COMMUNITY): Payer: Self-pay

## 2011-09-26 ENCOUNTER — Emergency Department (HOSPITAL_COMMUNITY)
Admission: EM | Admit: 2011-09-26 | Discharge: 2011-09-26 | Disposition: A | Discharge status: Home / Self Care | Payer: Medicare Other | Attending: Emergency Medicine | Admitting: Emergency Medicine

## 2011-09-26 DIAGNOSIS — M542 Cervicalgia: Secondary | ICD-10-CM | POA: Insufficient documentation

## 2011-09-26 DIAGNOSIS — R197 Diarrhea, unspecified: Secondary | ICD-10-CM | POA: Insufficient documentation

## 2011-09-26 DIAGNOSIS — I10 Essential (primary) hypertension: Secondary | ICD-10-CM | POA: Insufficient documentation

## 2011-09-26 DIAGNOSIS — M549 Dorsalgia, unspecified: Secondary | ICD-10-CM | POA: Insufficient documentation

## 2011-09-26 DIAGNOSIS — M546 Pain in thoracic spine: Secondary | ICD-10-CM | POA: Insufficient documentation

## 2011-09-26 DIAGNOSIS — R51 Headache: Secondary | ICD-10-CM | POA: Insufficient documentation

## 2011-09-26 DIAGNOSIS — E119 Type 2 diabetes mellitus without complications: Secondary | ICD-10-CM | POA: Insufficient documentation

## 2011-09-26 DIAGNOSIS — R110 Nausea: Secondary | ICD-10-CM | POA: Insufficient documentation

## 2011-09-26 HISTORY — DX: Other specified types of non-hodgkin lymphoma, unspecified site: C85.80

## 2011-09-26 HISTORY — DX: Urinary tract infection, site not specified: N39.0

## 2011-09-26 HISTORY — DX: Fibromyalgia: M79.7

## 2011-09-26 HISTORY — DX: Essential (primary) hypertension: I10

## 2011-09-26 HISTORY — DX: Malignant (primary) neoplasm, unspecified: C80.1

## 2011-09-26 MED ORDER — SODIUM CHLORIDE 0.9 % IV SOLN
Freq: Once | INTRAVENOUS | Status: AC
  Administered 2011-09-26: 06:00:00 via INTRAVENOUS

## 2011-09-26 MED ORDER — METOCLOPRAMIDE HCL 5 MG/ML IJ SOLN
10.0000 mg | Freq: Once | INTRAMUSCULAR | Status: AC
  Administered 2011-09-26: 10 mg via INTRAVENOUS
  Filled 2011-09-26: qty 2

## 2011-09-26 MED ORDER — DIPHENHYDRAMINE HCL 50 MG/ML IJ SOLN
25.0000 mg | Freq: Once | INTRAMUSCULAR | Status: AC
  Administered 2011-09-26: 25 mg via INTRAVENOUS
  Filled 2011-09-26: qty 1

## 2011-09-26 MED ORDER — KETOROLAC TROMETHAMINE 30 MG/ML IJ SOLN
30.0000 mg | Freq: Once | INTRAMUSCULAR | Status: AC
  Administered 2011-09-26: 30 mg via INTRAVENOUS
  Filled 2011-09-26: qty 1

## 2011-09-26 MED ORDER — METHOCARBAMOL 500 MG PO TABS: 500.0000 mg | ORAL_TABLET | Freq: Two times a day (BID) | ORAL | Status: AC | PRN

## 2011-09-26 MED ORDER — NAPROXEN 500 MG PO TABS: 500.0000 mg | ORAL_TABLET | Freq: Two times a day (BID) | ORAL | Status: AC

## 2011-09-26 NOTE — Discharge Instructions (Signed)
Please take the medications including Naprosyn and Robaxin exactly as prescribed, call your doctor immediately this morning to schedule followup for the next 24 hours or return to the hospital for severe or worsening pain, fever, stiff neck, change in vision, difficulty speaking, difficulty walking, weakness or numbness.  RESOURCE GUIDE  Dental Problems  Patients with Medicaid: [facility] [phone] [address]                                           [phone] [address] Phone:  [phone]                                                  Phone:  [phone]  If unable to pay or uninsured, contact:  [facility] or [facility]. to become qualified for the adult dental clinic.  Chronic Pain Problems Contact [facility]  [phone] Patients need to be referred by their primary care doctor.  Insufficient Money for Medicine Contact [employer]:  call "211" or [facility] [phone].  No Primary Care Doctor Call [facility]  [phone] Other agencies that provide inexpensive medical care    [facility]  [phone]    [facility] Internal Medicine  [phone]    [facility]  [phone]    [facility]  [phone]    [facility]  [phone]    [facility] Child Clinic  [phone]  Psychological Services [facility] Behavioral Health  [phone] [facility] Services  [phone] [facility] Mental Health   [phone] (emergency services [phone])  Substance Abuse Resources [facility]  [phone] [facility] [phone] [facility] [place] [phone] Floydene Flock [phone] Residential & Outpatient Substance Abuse Program  [phone]  Abuse/Neglect [facility] Child Abuse Hotline 928-309-6450 [facility] Child Abuse Hotline 819-106-7520 (After Hours)  Emergency Shelter [facility] [phone]  Maternity Homes Room  at the [place] [facility] [place] [phone] [facility] [phone]  MRSA Hotline #:   757-283-0702    [facility] Resources  [facility] [place] [facility]     [employer]                          [facility] Dept. [address] [place]                       [address]      [address]  [place]                                                Cristobal Goldmann Phone:  [phone]  Phone:  [phone]                 Phone:  [phone]  [place] [facility] Phone:  [phone]  [place] Child Abuse Hotline (336) 342-1394 [phone] (After Hours)   

## 2011-09-26 NOTE — ED Notes (Signed)
Pt reports that she has had sharp pains in neck into head and R side of face since approximately 1600.  Took muscle relaxer and one pain pill (oxycodone 5/325) without relief.  Pt reports to having weakness and nausea after headache started.

## 2011-09-26 NOTE — ED Provider Notes (Signed)
History     CSN: [id number]  Arrival date & time 09/26/11  0423   First MD Initiated Contact with Patient 09/26/11 [phone]      Chief Complaint  Patient presents with  . Headache    (Consider location/radiation/quality/duration/timing/severity/associated sxs/prior treatment) HPI Comments: 71 year old female with a history of hypertension, diet-controlled diabetes, large cell lymphoma who presents with a complaint of neck pain, upper back pain and headache. This started yesterday at approximately 4 PM when she awoke from a nap with pain. The pain is persistent, worse with palpation of the neck and back, worse with range of motion of the neck and not associated with fevers, chills, vomiting, abdominal chest pain or shortness of breath. She does have mild nausea and has had some loose stools and diarrhea. She took a Percocet prior to arrival with no relief. She does admit to having generalized weakness but no focal weakness numbness or change in vision. She states that she usually does not get headaches, this is unusual for her.  When the patient discusses her headache she calls it a sharp shooting and achy pain across her for head and across her posterior occiput and down into her neck. She has no pain with eye movements, no change in vision, no change in hearing, no vertigo, no tinnitus. When she points to her neck pain she points to the posterior neck, denies pain in the anterior neck or lateral neck.  She describes the symptoms as gradual in onset, have gradually gotten worse throughout the evening and had become persistent. She denies focal neurologic weakness, numbness, change in vision speech and no ataxia  Patient is a 71 y.o. female presenting with headaches. The history is provided by the patient and a relative.  Headache     Past Medical History  Diagnosis Date  . Hypertension   . Diabetes mellitus   . Fibromyalgia   . Urinary tract infection 09/19/11    Being treated at this time Cipro   . Cancer   . Lymphoma malignant, large cell     Past Surgical History  Procedure Date  . Cholecystectomy   . Appendectomy   . Rotator cuff repair   . Joint replacement   . Joint replacement     No family history on file.  History  Substance Use Topics  . Smoking status: Not on file  . Smokeless tobacco: Not on file  . Alcohol Use: No    OB History    Grav Para Term Preterm Abortions TAB SAB Ect Mult Living                  Review of Systems  Neurological: Positive for headaches.  All other systems reviewed and are negative.    Allergies  Review of patient's allergies indicates no known allergies.  Home Medications   Current Outpatient Rx  Name Route Sig Dispense Refill  . ASPIRIN EC 81 MG PO TBEC Oral Take 81 mg by mouth daily.    . ATENOLOL 50 MG PO TABS Oral Take 50 mg by mouth daily.    Marland Kitchen CIPROFLOXACIN HCL 500 MG PO TABS Oral Take 500 mg by mouth 2 (two) times daily. Ten days    . GABAPENTIN 100 MG PO CAPS Oral Take 100 mg by mouth 3 (three) times daily.    Marland Kitchen LOSARTAN POTASSIUM 25 MG PO TABS Oral Take 25 mg by mouth at bedtime.    . OXYCODONE-ACETAMINOPHEN 5-325 MG PO TABS Oral Take 1 tablet by mouth  every 4 (four) hours as needed. pain    . SIMVASTATIN 20 MG PO TABS Oral Take 20 mg by mouth at bedtime.    . METHOCARBAMOL 500 MG PO TABS Oral Take 1 tablet (500 mg total) by mouth 2 (two) times daily as needed. 20 tablet 0  . NAPROXEN 500 MG PO TABS Oral Take 1 tablet (500 mg total) by mouth 2 (two) times daily with a meal. 30 tablet 0    BP 173/93  Pulse 113  Temp(Src) 98 F (36.7 C) (Oral)  Resp 20  Ht 4\' 11"  (1.499 m)  Wt 165 lb (74.844 kg)  BMI 33.33 kg/m2  SpO2 100%  Physical Exam  Nursing note and vitals reviewed. Constitutional: She appears well-developed and well-nourished. No distress.  HENT:  Head: Normocephalic and atraumatic.  Mouth/Throat: Oropharynx is clear and moist. No oropharyngeal exudate.  Eyes: Conjunctivae and EOM are  normal. Pupils are equal, round, and reactive to light. Right eye exhibits no discharge. Left eye exhibits no discharge. No scleral icterus.  Neck: No JVD present. No thyromegaly present.       Tenderness to palpation over the posterior neck, no pain over the carotids, no bruits, mild decreased range of motion secondary to pain  Cardiovascular: Normal rate, regular rhythm, normal heart sounds and intact distal pulses.  Exam reveals no gallop and no friction rub.   No murmur heard. Pulmonary/Chest: Effort normal and breath sounds normal. No respiratory distress. She has no wheezes. She has no rales.  Abdominal: Soft. Bowel sounds are normal. She exhibits no distension and no mass. There is no tenderness.  Musculoskeletal: Normal range of motion. She exhibits tenderness ( Tender to palpation over the bilateral paraspinal muscles, rhomboid muscles, trapezius muscles, cervical paraspinal muscles. No spinal tenderness to palpation of the lumbar thoracic or cervical spines). She exhibits no edema.  Lymphadenopathy:    She has no cervical adenopathy.  Neurological: She is alert. Coordination normal.       Speech clear, motor clear and intact bilaterally, normal grips bilaterally, 5 out of 5 strength in bilateral arms and legs at major muscle groups. Sensation to light touch and pinprick intact, extraocular movements intact without pain, pupillary exam is normal, cranial nerves III through XII intact, no facial droop  Skin: Skin is warm and dry. No rash noted. No erythema.  Psychiatric: She has a normal mood and affect. Her behavior is normal.    ED Course  Procedures (including critical care time)  Labs Reviewed - No data to display No results found.   1. Neck pain   2. Back pain       MDM  Reproducible tenderness to palpation, no fever, tachycardia or focal neurologic symptoms to suggest intracranial lesion or meningitis. Treat with Toradol, Reglan, Benadryl, reevaluate.  Location of the pain  and reproducible nature without any focal neurologic findings suggest that this is not do to cervical artery dissection   Pain has improved with Toradol, Reglan, Benadryl. Vital signs remain normal without tachycardia or fevers, mental status remains normal, speech remains clear. Patient is now able to move her neck with minimal difficulty, still has reproducible tenderness over her trapezius muscles, bilateral face and neck. Again I suspect this is muscular in origin, doubt more serious sources but have encouraged patient to have 12 to 24-hour followup if she is not improved, ER followup for severe or worsening symptoms. Understanding expressed   Discharge Prescriptions include:  #1 Naprosyn #2 Robaxin  Vida Roller, MD 09/26/11  0640 

## 2013-04-10 ENCOUNTER — Ambulatory Visit: Payer: Medicare Other | Attending: Orthopedic Surgery | Admitting: Physical Therapy

## 2013-04-10 DIAGNOSIS — IMO0001 Reserved for inherently not codable concepts without codable children: Secondary | ICD-10-CM | POA: Insufficient documentation

## 2013-04-10 DIAGNOSIS — M25559 Pain in unspecified hip: Secondary | ICD-10-CM | POA: Insufficient documentation

## 2013-04-16 ENCOUNTER — Ambulatory Visit: Payer: Medicare Other | Admitting: Physical Therapy

## 2013-04-18 ENCOUNTER — Ambulatory Visit: Payer: Medicare Other | Admitting: Physical Therapy

## 2013-04-23 ENCOUNTER — Ambulatory Visit: Payer: Medicare Other | Attending: Orthopedic Surgery | Admitting: Physical Therapy

## 2013-04-23 DIAGNOSIS — M25559 Pain in unspecified hip: Secondary | ICD-10-CM | POA: Insufficient documentation

## 2013-04-23 DIAGNOSIS — IMO0001 Reserved for inherently not codable concepts without codable children: Secondary | ICD-10-CM | POA: Insufficient documentation

## 2013-04-25 ENCOUNTER — Ambulatory Visit: Payer: Medicare Other | Admitting: Physical Therapy

## 2013-04-30 ENCOUNTER — Ambulatory Visit: Payer: Medicare Other | Admitting: Physical Therapy

## 2013-05-02 ENCOUNTER — Ambulatory Visit: Payer: Medicare Other | Admitting: Physical Therapy

## 2013-05-06 ENCOUNTER — Other Ambulatory Visit: Payer: Self-pay

## 2013-05-06 DIAGNOSIS — Z1231 Encounter for screening mammogram for malignant neoplasm of breast: Secondary | ICD-10-CM

## 2013-06-07 ENCOUNTER — Ambulatory Visit
Admission: RE | Admit: 2013-06-07 | Discharge: 2013-06-07 | Disposition: A | Discharge status: Home / Self Care | Payer: Medicare Other | Source: Ambulatory Visit

## 2013-06-07 DIAGNOSIS — Z1231 Encounter for screening mammogram for malignant neoplasm of breast: Secondary | ICD-10-CM

## 2013-06-20 HISTORY — PX: CATARACT EXTRACTION: SUR2

## 2013-07-02 ENCOUNTER — Other Ambulatory Visit: Payer: Self-pay | Admitting: Registered Nurse

## 2013-07-02 ENCOUNTER — Other Ambulatory Visit (HOSPITAL_COMMUNITY)
Admission: RE | Admit: 2013-07-02 | Discharge: 2013-07-02 | Disposition: A | Discharge status: Home / Self Care | Payer: Medicare Other | Source: Ambulatory Visit | Attending: Internal Medicine | Admitting: Internal Medicine

## 2013-07-02 DIAGNOSIS — Z01419 Encounter for gynecological examination (general) (routine) without abnormal findings: Secondary | ICD-10-CM | POA: Insufficient documentation

## 2014-05-12 ENCOUNTER — Other Ambulatory Visit: Payer: Self-pay

## 2014-05-12 DIAGNOSIS — Z1231 Encounter for screening mammogram for malignant neoplasm of breast: Secondary | ICD-10-CM

## 2014-06-10 ENCOUNTER — Ambulatory Visit
Admission: RE | Admit: 2014-06-10 | Discharge: 2014-06-10 | Disposition: A | Discharge status: Home / Self Care | Payer: Medicare Other | Source: Ambulatory Visit

## 2014-06-10 DIAGNOSIS — Z1231 Encounter for screening mammogram for malignant neoplasm of breast: Secondary | ICD-10-CM

## 2014-12-16 ENCOUNTER — Encounter: Payer: Self-pay | Admitting: Neurology

## 2014-12-16 ENCOUNTER — Ambulatory Visit (INDEPENDENT_AMBULATORY_CARE_PROVIDER_SITE_OTHER): Payer: Medicare Other | Admitting: Neurology

## 2014-12-16 VITALS — BP 184/88 | HR 59 | Temp 97.9°F | Ht 59.0 in | Wt 177.0 lb

## 2014-12-16 DIAGNOSIS — G130 Paraneoplastic neuromyopathy and neuropathy: Secondary | ICD-10-CM | POA: Diagnosis not present

## 2014-12-16 DIAGNOSIS — G609 Hereditary and idiopathic neuropathy, unspecified: Secondary | ICD-10-CM | POA: Diagnosis not present

## 2014-12-16 DIAGNOSIS — D849 Immunodeficiency, unspecified: Secondary | ICD-10-CM | POA: Diagnosis not present

## 2014-12-16 DIAGNOSIS — E531 Pyridoxine deficiency: Secondary | ICD-10-CM | POA: Diagnosis not present

## 2014-12-16 DIAGNOSIS — R7302 Impaired glucose tolerance (oral): Secondary | ICD-10-CM | POA: Diagnosis not present

## 2014-12-16 DIAGNOSIS — E519 Thiamine deficiency, unspecified: Secondary | ICD-10-CM | POA: Diagnosis not present

## 2014-12-16 DIAGNOSIS — E538 Deficiency of other specified B group vitamins: Secondary | ICD-10-CM

## 2014-12-16 DIAGNOSIS — R5382 Chronic fatigue, unspecified: Secondary | ICD-10-CM

## 2014-12-16 DIAGNOSIS — D499 Neoplasm of unspecified behavior of unspecified site: Secondary | ICD-10-CM

## 2014-12-16 MED ORDER — GABAPENTIN 300 MG PO CAPS: 300.0000 mg | ORAL_CAPSULE | Freq: Three times a day (TID) | ORAL | Status: DC

## 2014-12-16 NOTE — Progress Notes (Signed)
[facility]    Provider:  Dr [name] Referring Provider: [name], MD Primary Care Physician:  [name], MD  CC:  Neuropathy  HPI:  [name] is a 74 y.o. female here as a referral from Dr. Maudie Mercury for neuropathy. Past medical history of hypertension, diabetes, fibromyalgia, hyperlipidemia, non-Hodgkin's lymphoma Dxed 2007.  She has neuropathy, worsening. Symptoms started in her feet with chemotheraoy. Now it is in her hands and fingers. She can't button things, she can't sew. Last chemo was 8 years. She has diabetes and it is pre-diabetes. When she walks, she feels off balance. She has numbness, tingling and burning. It wakes her up at night. Her hands are waking her up at night due to pain. Digits 1-3 of both hands. Dropping things. Some days her symptoms are up to the ankles. She has severe cramping in the feet. She is on Gabapentin 3 times a day 100mg . She has been on that for years. The symptoms are symmetric. She has musculoskeletal pain. She has Fibromyalgia and tried Lyrica and it didn't help. She has a history of chemotherapy, No history of other medications that can cause neuropathy such as certain antibiotics,  cardiovascular agents such as amiodarone, rheumatologic agents such as allopurinol, or other miscellaneous drugs or toxins. She is already on Neurontin 100 mg 3 times a day. She takes vitamin D daily. She also takes a daily aspirin.  Review of Systems: Patient complains of symptoms per HPI as well as the following symptoms: Weight gain, fatigue, feeling cold, increased thirst, flushing, joint pain, cramps, aching muscles, confusion, numbness, weakness, insomnia, not enough sleep, decreased energy Pertinent negatives per HPI. All others negative.   History   Social History  . Marital Status: Married    Spouse Name: [name]   . Number of Children: 6  . Years of Education: 12   Occupational History  . Retired     Social History Main Topics  .  Smoking status: Never Smoker   . Smokeless tobacco: Not on file  . Alcohol Use: No  . Drug Use: No  . Sexual Activity: Not on file   Other Topics Concern  . Not on file   Social History Narrative   Lives at home with husband, [name].   Caffeine use: 2-3 cups coffee per day    Family History  Problem Relation Age of Onset  . Heart Problems Brother   . Cancer Sister     Past Medical History  Diagnosis Date  . Hypertension   . Diabetes mellitus   . Fibromyalgia   . Urinary tract infection 09/19/11    Being treated at this time Cipro  . Cancer   . Lymphoma malignant, large cell     Past Surgical History  Procedure Laterality Date  . Cholecystectomy    . Appendectomy    . Rotator cuff repair    . Joint replacement    . Joint replacement    . Cataract extraction  2015    Current Outpatient Prescriptions  Medication Sig Dispense Refill  . aspirin EC 81 MG tablet Take 81 mg by mouth daily.    Marland Kitchen atenolol (TENORMIN) 50 MG tablet Take 50 mg by mouth daily.    Marland Kitchen BENICAR HCT 40-12.5 MG per tablet Take 1 tablet by mouth daily.    Marland Kitchen BIOTIN 5000 PO Take 1 tablet by mouth daily.    . Cholecalciferol (VITAMIN D PO) Take 1,000 Units by mouth daily.    Marland Kitchen gabapentin (NEURONTIN)  100 MG capsule Take 100 mg by mouth 3 (three) times daily.    . Magnesium 500 MG CAPS Take 1 capsule by mouth daily.    . metFORMIN (GLUCOPHAGE) 500 MG tablet Take 500 mg by mouth daily.    . Multiple Vitamins-Minerals (CENTRUM SILVER PO) Take 1 tablet by mouth daily.    . Omega-3 Fatty Acids (FISH OIL PO) Take 1,200 mg by mouth daily.     No current facility-administered medications for this visit.    Allergies as of 12/16/2014  . (No Known Allergies)    Vitals: BP 184/88 mmHg  Pulse 59  Temp(Src) 97.9 F (36.6 C) (Oral)  Ht 4\' 11"  (1.499 m)  Wt 177 lb (80.287 kg)  BMI 35.73 kg/m2 Last Weight:  Wt Readings from Last 1 Encounters:  12/16/14 177 lb (80.287 kg)   Last Height:   Ht Readings from  Last 1 Encounters:  12/16/14 4\' 11"  (1.499 m)    Physical exam: Exam: Gen: NAD, conversant, well nourised, obese, well groomed                     CV: RRR, no MRG. No Carotid Bruits. No peripheral edema, warm, nontender Eyes: Conjunctivae clear without exudates or hemorrhage  Neuro: Detailed Neurologic Exam  Speech:    Speech is normal; fluent and spontaneous with normal comprehension.  Cognition:    The patient is oriented to person, place, and time;     recent and remote memory intact;     language fluent;     normal attention, concentration,     fund of knowledge Cranial Nerves:    The pupils are equal, round, and reactive to light. The fundi are flat. Visual fields are full to finger confrontation. Extraocular movements are intact. Trigeminal sensation is intact and the muscles of mastication are normal. The face is symmetric. The palate elevates in the midline. Hearing intact. Voice is normal. Shoulder shrug is normal. The tongue has normal motion without fasciculations.   Coordination:    Normal finger to nose and heel to shin. Normal rapid alternating movements.   Gait:    Heel-toe and tandem gait are normal.   Motor Observation:    No asymmetry, no atrophy, and no involuntary movements noted. Tone:    Normal muscle tone.    Posture:    Posture is normal. normal erect    Strength:    Strength is V/V in the upper and lower limbs.      Sensation: Decreased pinprick in digits one through 3 in the median distribution. Intact pinprick, vibration, proprioception and temperature distally in the feet     Reflex Exam:  DTR's: Absent lowers, intact uppers.     Toes:    The toes are downgoing bilaterally.   Clonus:    Clonus is absent.      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 She feels her symptoms are worsening and she is having significant pain, decreased sensation and decreased fine motor movements in her hands of digits 1  through 3. Sensory exam was quite normal, with intact sensation to all modalities distally in the feet. She likely has a mild sensory neuropathy in the toes based on clinical exam. I wouldn't expect a length dependent neuropathy to have reached the fingers since patient's distal lower extremity symptoms are so mild on exam; the fingers are usually involved when polyneuropathy in the lowers extends closer to the knees. Symptoms in the hands may  be a different process such as bilateral carpal tunnel syndrome. We'll perform a complete serum neuropathy panel. We'll also order an EMG nerve conduction study of all 4 limbs. We'll increase her Neurontin to 300 mg 3 times a day, warned of sedation and other side effects.   [name], MD  [facility] [address] [place], [place] [address]  Phone [phone] Fax [phone]

## 2014-12-16 NOTE — Patient Instructions (Signed)
Overall you are doing fairly well but I do want to suggest a few things today:   Remember to drink plenty of fluid, eat healthy meals and do not skip any meals. Try to eat protein with a every meal and eat a healthy snack such as fruit or nuts in between meals. Try to keep a regular sleep-wake schedule and try to exercise daily, particularly in the form of walking, 20-30 minutes a day, if you can.   As far as your medications are concerned, I would like to suggest: Increase neurontin to 300mg  three time a day  As far as diagnostic testing: labs, emg/ncs  I would like to see you back for emg/ncs, sooner if we need to. Please call us with any interim questions, concerns, problems, updates or refill requests.   Please also call us for any test results so we can go over those with you on the phone.  My clinical assistant and will answer any of your questions and relay your messages to me and also relay most of my messages to you.   Our phone number is [phone]. We also have an after hours call service for urgent matters and there is a physician on-call for urgent questions. For any emergencies you know to call 911 or go to the nearest emergency room

## 2014-12-18 LAB — COMPREHENSIVE METABOLIC PANEL
A/G RATIO: 1.8 (ref 1.1–2.5)
ALT: 33 IU/L — AB (ref 0–32)
AST: 23 IU/L (ref 0–40)
Albumin: 4.3 g/dL (ref 3.5–4.8)
Alkaline Phosphatase: 106 IU/L (ref 39–117)
BILIRUBIN TOTAL: 0.8 mg/dL (ref 0.0–1.2)
BUN/Creatinine Ratio: 23 (ref 11–26)
BUN: 25 mg/dL (ref 8–27)
CALCIUM: 10 mg/dL (ref 8.7–10.3)
CO2: 26 mmol/L (ref 18–29)
CREATININE: 1.09 mg/dL — AB (ref 0.57–1.00)
Chloride: 101 mmol/L (ref 97–108)
GFR, EST AFRICAN AMERICAN: 58 mL/min/{1.73_m2} — AB (ref >59)
GFR, EST NON AFRICAN AMERICAN: 50 mL/min/{1.73_m2} — AB (ref >59)
GLUCOSE: 96 mg/dL (ref 65–99)
Globulin, Total: 2.4 g/dL (ref 1.5–4.5)
POTASSIUM: 4 mmol/L (ref 3.5–5.2)
Sodium: 141 mmol/L (ref 134–144)
TOTAL PROTEIN: 6.7 g/dL (ref 6.0–8.5)

## 2014-12-18 LAB — PARANEOPLASTIC PROFILE 1: Purkinje Cell (Yo) Autoantobodies- IFA: <1:10 {titer}

## 2014-12-18 LAB — MULTIPLE MYELOMA PANEL, SERUM
ALBUMIN SERPL ELPH-MCNC: 3.8 g/dL (ref 2.9–4.4)
ALBUMIN/GLOB SERPL: 1.4 (ref 0.7–1.7)
Alpha 1: 0.2 g/dL (ref 0.0–0.4)
Alpha2 Glob SerPl Elph-Mcnc: 0.7 g/dL (ref 0.4–1.0)
B-Globulin SerPl Elph-Mcnc: 1.1 g/dL (ref 0.7–1.3)
Gamma Glob SerPl Elph-Mcnc: 1 g/dL (ref 0.4–1.8)
Globulin, Total: 2.9 g/dL (ref 2.2–3.9)
IGG (IMMUNOGLOBIN G), SERUM: 1118 mg/dL (ref 700–1600)
IGM (IMMUNOGLOBULIN M), SRM: 84 mg/dL (ref 26–217)
IgA/Immunoglobulin A, Serum: 199 mg/dL (ref 64–422)

## 2014-12-18 LAB — SEDIMENTATION RATE: SED RATE: 5 mm/h (ref 0–40)

## 2014-12-18 LAB — RHEUMATOID FACTOR: RHEUMATOID FACTOR: 10.8 [IU]/mL (ref 0.0–13.9)

## 2014-12-18 LAB — HEMOGLOBIN A1C
ESTIMATED AVERAGE GLUCOSE: 137 mg/dL
Hgb A1c MFr Bld: 6.4 % — ABNORMAL HIGH (ref 4.8–5.6)

## 2014-12-18 LAB — VITAMIN B1: THIAMINE: 184.4 nmol/L (ref 66.5–200.0)

## 2014-12-18 LAB — PAN-ANCA
ANCA Proteinase 3: <3.5 U/mL (ref 0.0–3.5)
C-ANCA: <1:20 {titer}
Myeloperoxidase Ab: <9 U/mL (ref 0.0–9.0)

## 2014-12-18 LAB — B12 AND FOLATE PANEL: VITAMIN B 12: 676 pg/mL (ref 211–946)

## 2014-12-18 LAB — VITAMIN B6: Vitamin B6: 20.3 ug/L (ref 2.0–32.8)

## 2014-12-18 LAB — ANGIOTENSIN CONVERTING ENZYME: Angio Convert Enzyme: 70 U/L (ref 14–82)

## 2014-12-18 LAB — ANA W/REFLEX: Anti Nuclear Antibody(ANA): NEGATIVE

## 2014-12-18 LAB — METHYLMALONIC ACID, SERUM: Methylmalonic Acid: 267 nmol/L (ref 0–378)

## 2014-12-18 LAB — TSH: TSH: 1.91 u[IU]/mL (ref 0.450–4.500)

## 2014-12-18 LAB — B. BURGDORFI ANTIBODIES

## 2014-12-23 ENCOUNTER — Telehealth: Payer: Self-pay | Admitting: *Deleted

## 2014-12-23 NOTE — Telephone Encounter (Signed)
Left detailed VM about lab work. Told her all unremarkable except for her HGbA1c which is technically in the diabetic range at 6.4. Told her to f/u with her PCP about this and that her creatinine is mildly elevated amd she can repeat that with pcp as well. Gave [facility] phone number and office hours if she has any questions.

## 2015-01-05 ENCOUNTER — Ambulatory Visit (INDEPENDENT_AMBULATORY_CARE_PROVIDER_SITE_OTHER): Payer: Medicare Other | Admitting: Neurology

## 2015-01-05 ENCOUNTER — Ambulatory Visit (INDEPENDENT_AMBULATORY_CARE_PROVIDER_SITE_OTHER): Payer: Self-pay | Admitting: Neurology

## 2015-01-05 DIAGNOSIS — G5603 Carpal tunnel syndrome, bilateral upper limbs: Secondary | ICD-10-CM

## 2015-01-05 DIAGNOSIS — E531 Pyridoxine deficiency: Secondary | ICD-10-CM

## 2015-01-05 DIAGNOSIS — D499 Neoplasm of unspecified behavior of unspecified site: Secondary | ICD-10-CM

## 2015-01-05 DIAGNOSIS — R5382 Chronic fatigue, unspecified: Secondary | ICD-10-CM

## 2015-01-05 DIAGNOSIS — G609 Hereditary and idiopathic neuropathy, unspecified: Secondary | ICD-10-CM

## 2015-01-05 DIAGNOSIS — G130 Paraneoplastic neuromyopathy and neuropathy: Secondary | ICD-10-CM

## 2015-01-05 DIAGNOSIS — E519 Thiamine deficiency, unspecified: Secondary | ICD-10-CM

## 2015-01-05 DIAGNOSIS — Z0289 Encounter for other administrative examinations: Secondary | ICD-10-CM

## 2015-01-05 DIAGNOSIS — E538 Deficiency of other specified B group vitamins: Secondary | ICD-10-CM

## 2015-01-05 DIAGNOSIS — R7302 Impaired glucose tolerance (oral): Secondary | ICD-10-CM

## 2015-01-05 NOTE — Progress Notes (Signed)
  [facility]    Provider:  Dr [name] Referring Provider: [name], MD Primary Care Physician:  [name], MD  HPI: [name] is a 74 y.o. female here as a referral from Dr. Maudie Mercury for neuropathy. Past medical history of hypertension, diabetes, fibromyalgia, hyperlipidemia, non-Hodgkin's lymphoma Dxed 2007. She is having pain and weakness in her hands.  She can't button things, she can't sew anymore. Her hands are waking her up at night due to pain. Digits 1-3 of both hands worse, with pain and sensory disturbances. Dropping things, weak grip. Some musculoskeletal neck pain, no radicular symptoms.  Symptoms in her feet started with chemotheraoy. Last chemo was 8 years. When she walks, she feels off balance. She has numbness, tingling and burning. It wakes her up at night.  Some days her symptoms are up to the ankles. She has severe cramping in the feet.  The symptoms are symmetric.  Summary:   Nerve Conduction Studies were performed on the bilateral upper and lower extremities.  The right median APB motor nerve showed prolonged distal onset latency (13 ms, N<4.0) and reduced amplitude (.79mV, N>3) The right Median 2nd Digit sensory nerve showed no response  F Wave studies indicate that the right Median F wave has delayed latency(46.4, N<11ms).  The left median APB motor nerve showed prolonged distal onset latency (11.3 ms, N<4.0) and reduced amplitude (.79mV, N>3) The left Median 2nd Digit sensory nerve showed no response F Wave studies indicate that the left Median F wave showed no response  Bilateral Ulnar ADM motor nerves were within normal limits. F Wave studies indicate that the bilateral Ulnar F waves have normal latencies  The bilateralUlnar 5th digit sensory nerves were within normal limits. The bilateral sural  sensory nerves were within normal limits.  The bilateral Peroneal motor nerves showed normal conductions  The bilateral Tibial motor nerves  showed normal conductions   Bilateral H Reflexes showed normal latencies   EMG needle study of selected bilateral upper extremity muscles was performed. The bilateral Opponens Pollicis muscles showed moderately increased spontaneous activity (positive sharp waves) and diminished recruitment. The following bilateral muscles were normal: Deltoid, Triceps, Pronator Teres,  First Dorsal Interosseous, Extensor Indices.  Conclusion: This is an abnormal study. There is electrophysiologic evidence of bilateral severe Carpal Tunnel Syndrome. No suggestion of  radiculopathy. No evidence for large-fiber neuropathy however a small-fiber neuropathy could explain  patient's lower extremity symptoms and still evade detection by this study. Clinical correlation recommended.   [name], MD  [facility] [address] [place], [place] [address]  Phone [phone] Fax [phone]

## 2015-01-05 NOTE — Procedures (Signed)
[facility]    Provider:  Dr [name] Referring Provider: [name], MD Primary Care Physician:  [name], MD  HPI: [name] is a 74 y.o. female here as a referral from Dr. Maudie Mercury for neuropathy. Past medical history of hypertension, diabetes, fibromyalgia, hyperlipidemia, non-Hodgkin's lymphoma Dxed 2007. She is having pain and weakness in her hands.  She can't button things, she can't sew anymore. Her hands are waking her up at night due to pain. Digits 1-3 of both hands worse, with pain and sensory disturbances. Dropping things, weak grip. Some musculoskeletal neck pain, no radicular symptoms.  Symptoms in her feet started with chemotheraoy. Last chemo was 8 years. When she walks, she feels off balance. She has numbness, tingling and burning. It wakes her up at night.  Some days her symptoms are up to the ankles. She has severe cramping in the feet.  The symptoms are symmetric.  Summary:   Nerve Conduction Studies were performed on the bilateral upper and lower extremities.  The right median APB motor nerve showed prolonged distal onset latency (13 ms, N<4.0) and reduced amplitude (.17mV, N>3) The right Median 2nd Digit sensory nerve showed no response  F Wave studies indicate that the right Median F wave has delayed latency(46.4, N<46ms).  The left median APB motor nerve showed prolonged distal onset latency (11.3 ms, N<4.0) and reduced amplitude (.10mV, N>3) The left Median 2nd Digit sensory nerve showed no response F Wave studies indicate that the left Median F wave showed no response  Bilateral Ulnar ADM motor nerves were within normal limits. F Wave studies indicate that the bilateral Ulnar F waves have normal latencies  The bilateralUlnar 5th digit sensory nerves were within normal limits. The bilateral sural  sensory nerves were within normal limits.  The bilateral Peroneal motor nerves showed normal conductions  The bilateral Tibial motor nerves  showed normal conductions   Bilateral H Reflexes showed normal latencies   EMG needle study of selected bilateral upper extremity muscles was performed. The bilateral Opponens Pollicis muscles showed moderately increased spontaneous activity (positive sharp waves) and diminished recruitment. The following bilateral muscles were normal: Deltoid, Triceps, Pronator Teres,  First Dorsal Interosseous, Extensor Indices.  Conclusion: This is an abnormal study. There is electrophysiologic evidence of bilateral severe Carpal Tunnel Syndrome. No suggestion of  radiculopathy. No evidence for large-fiber neuropathy however a small-fiber neuropathy could explain  patient's lower extremity symptoms and still evade detection by this study. Clinical correlation recommended.   [name], MD  [facility] [address] [place], [place] [address]  Phone [phone] Fax [phone]

## 2015-01-05 NOTE — Progress Notes (Signed)
See procedure note.

## 2015-01-07 ENCOUNTER — Telehealth: Payer: Self-pay | Admitting: Neurology

## 2015-01-07 MED ORDER — TRAMADOL HCL 50 MG PO TABS: 50.0000 mg | ORAL_TABLET | Freq: Four times a day (QID) | ORAL | Status: DC | PRN

## 2015-01-07 NOTE — Telephone Encounter (Signed)
Spoke w/ [facility] pharmacy and they received fax for Rx tramadol and are working on filling rx now.

## 2015-01-07 NOTE — Telephone Encounter (Signed)
Patient called requesting to speak with RN regarding RX for tramadol.Please call and advise. Patient can be reached at [phone].

## 2015-01-07 NOTE — Telephone Encounter (Signed)
Faxed Rx Tramadol 50mg  to [facility] on [address]. Faxed to [phone]. Received fax confirmation.

## 2015-01-07 NOTE — Telephone Encounter (Signed)
Dr. [name] told pt she would order Rx tramadol and send to her pharmacy. Pt verbalized understanding.

## 2015-01-07 NOTE — Addendum Note (Signed)
Addended by: [name] B on: 01/07/2015 05:45 PM   Modules accepted: Orders

## 2015-02-03 ENCOUNTER — Other Ambulatory Visit: Payer: Self-pay

## 2015-02-16 ENCOUNTER — Encounter (HOSPITAL_COMMUNITY): Payer: Self-pay

## 2015-02-16 ENCOUNTER — Inpatient Hospital Stay (HOSPITAL_COMMUNITY)
Admission: EM | Admit: 2015-02-16 | Discharge: 2015-02-18 | DRG: 065 | Disposition: A | Discharge status: Home / Self Care | Payer: Medicare Other | Attending: Internal Medicine | Admitting: Internal Medicine

## 2015-02-16 ENCOUNTER — Emergency Department (HOSPITAL_COMMUNITY): Payer: Medicare Other

## 2015-02-16 DIAGNOSIS — I4891 Unspecified atrial fibrillation: Secondary | ICD-10-CM | POA: Diagnosis present

## 2015-02-16 DIAGNOSIS — R4781 Slurred speech: Secondary | ICD-10-CM

## 2015-02-16 DIAGNOSIS — I63411 Cerebral infarction due to embolism of right middle cerebral artery: Secondary | ICD-10-CM | POA: Diagnosis not present

## 2015-02-16 DIAGNOSIS — Z8673 Personal history of transient ischemic attack (TIA), and cerebral infarction without residual deficits: Secondary | ICD-10-CM | POA: Diagnosis present

## 2015-02-16 DIAGNOSIS — R200 Anesthesia of skin: Secondary | ICD-10-CM

## 2015-02-16 DIAGNOSIS — E876 Hypokalemia: Secondary | ICD-10-CM | POA: Diagnosis present

## 2015-02-16 DIAGNOSIS — G459 Transient cerebral ischemic attack, unspecified: Secondary | ICD-10-CM

## 2015-02-16 DIAGNOSIS — E1140 Type 2 diabetes mellitus with diabetic neuropathy, unspecified: Secondary | ICD-10-CM | POA: Diagnosis present

## 2015-02-16 DIAGNOSIS — Z9842 Cataract extraction status, left eye: Secondary | ICD-10-CM | POA: Diagnosis not present

## 2015-02-16 DIAGNOSIS — M797 Fibromyalgia: Secondary | ICD-10-CM | POA: Diagnosis present

## 2015-02-16 DIAGNOSIS — I4892 Unspecified atrial flutter: Secondary | ICD-10-CM | POA: Diagnosis present

## 2015-02-16 DIAGNOSIS — E785 Hyperlipidemia, unspecified: Secondary | ICD-10-CM | POA: Diagnosis present

## 2015-02-16 DIAGNOSIS — R4701 Aphasia: Secondary | ICD-10-CM | POA: Diagnosis present

## 2015-02-16 DIAGNOSIS — Z8572 Personal history of non-Hodgkin lymphomas: Secondary | ICD-10-CM

## 2015-02-16 DIAGNOSIS — Z7982 Long term (current) use of aspirin: Secondary | ICD-10-CM | POA: Diagnosis not present

## 2015-02-16 DIAGNOSIS — Z9221 Personal history of antineoplastic chemotherapy: Secondary | ICD-10-CM

## 2015-02-16 DIAGNOSIS — Z79899 Other long term (current) drug therapy: Secondary | ICD-10-CM | POA: Diagnosis not present

## 2015-02-16 DIAGNOSIS — Z9849 Cataract extraction status, unspecified eye: Secondary | ICD-10-CM | POA: Diagnosis not present

## 2015-02-16 DIAGNOSIS — E1129 Type 2 diabetes mellitus with other diabetic kidney complication: Secondary | ICD-10-CM | POA: Diagnosis not present

## 2015-02-16 DIAGNOSIS — Z9841 Cataract extraction status, right eye: Secondary | ICD-10-CM

## 2015-02-16 DIAGNOSIS — R001 Bradycardia, unspecified: Secondary | ICD-10-CM | POA: Diagnosis present

## 2015-02-16 DIAGNOSIS — I10 Essential (primary) hypertension: Secondary | ICD-10-CM | POA: Diagnosis present

## 2015-02-16 DIAGNOSIS — R202 Paresthesia of skin: Secondary | ICD-10-CM | POA: Diagnosis not present

## 2015-02-16 DIAGNOSIS — E119 Type 2 diabetes mellitus without complications: Secondary | ICD-10-CM

## 2015-02-16 DIAGNOSIS — G5600 Carpal tunnel syndrome, unspecified upper limb: Secondary | ICD-10-CM | POA: Diagnosis present

## 2015-02-16 DIAGNOSIS — N179 Acute kidney failure, unspecified: Secondary | ICD-10-CM | POA: Diagnosis present

## 2015-02-16 HISTORY — DX: Slurred speech: R47.81

## 2015-02-16 LAB — DIFFERENTIAL
BASOS PCT: 1 % (ref 0–1)
Basophils Absolute: 0 10*3/uL (ref 0.0–0.1)
EOS PCT: 4 % (ref 0–5)
Eosinophils Absolute: 0.3 10*3/uL (ref 0.0–0.7)
Lymphocytes Relative: 33 % (ref 12–46)
Lymphs Abs: 2.7 10*3/uL (ref 0.7–4.0)
MONO ABS: 0.7 10*3/uL (ref 0.1–1.0)
Monocytes Relative: 8 % (ref 3–12)
Neutro Abs: 4.4 10*3/uL (ref 1.7–7.7)
Neutrophils Relative %: 54 % (ref 43–77)

## 2015-02-16 LAB — I-STAT CHEM 8, ED
BUN: 38 mg/dL — ABNORMAL HIGH (ref 6–20)
Calcium, Ion: 1.14 mmol/L (ref 1.13–1.30)
Chloride: 101 mmol/L (ref 101–111)
Creatinine, Ser: 1.5 mg/dL — ABNORMAL HIGH (ref 0.44–1.00)
GLUCOSE: 105 mg/dL — AB (ref 65–99)
HEMATOCRIT: 40 % (ref 36.0–46.0)
HEMOGLOBIN: 13.6 g/dL (ref 12.0–15.0)
POTASSIUM: 3.4 mmol/L — AB (ref 3.5–5.1)
Sodium: 138 mmol/L (ref 135–145)
TCO2: 25 mmol/L (ref 0–100)

## 2015-02-16 LAB — COMPREHENSIVE METABOLIC PANEL
ALT: 31 U/L (ref 14–54)
ANION GAP: 10 (ref 5–15)
AST: 27 U/L (ref 15–41)
Albumin: 4 g/dL (ref 3.5–5.0)
Alkaline Phosphatase: 93 U/L (ref 38–126)
BUN: 40 mg/dL — ABNORMAL HIGH (ref 6–20)
CALCIUM: 9.1 mg/dL (ref 8.9–10.3)
CHLORIDE: 102 mmol/L (ref 101–111)
CO2: 24 mmol/L (ref 22–32)
Creatinine, Ser: 1.4 mg/dL — ABNORMAL HIGH (ref 0.44–1.00)
GFR, EST AFRICAN AMERICAN: 42 mL/min — AB (ref >60)
GFR, EST NON AFRICAN AMERICAN: 36 mL/min — AB (ref >60)
Glucose, Bld: 106 mg/dL — ABNORMAL HIGH (ref 65–99)
Potassium: 3.4 mmol/L — ABNORMAL LOW (ref 3.5–5.1)
SODIUM: 136 mmol/L (ref 135–145)
Total Bilirubin: 0.9 mg/dL (ref 0.3–1.2)
Total Protein: 7 g/dL (ref 6.5–8.1)

## 2015-02-16 LAB — CBC
HCT: 39.3 % (ref 36.0–46.0)
Hemoglobin: 13 g/dL (ref 12.0–15.0)
MCH: 30.7 pg (ref 26.0–34.0)
MCHC: 33.1 g/dL (ref 30.0–36.0)
MCV: 92.9 fL (ref 78.0–100.0)
PLATELETS: 237 10*3/uL (ref 150–400)
RBC: 4.23 MIL/uL (ref 3.87–5.11)
RDW: 14 % (ref 11.5–15.5)
WBC: 8.1 10*3/uL (ref 4.0–10.5)

## 2015-02-16 LAB — APTT: aPTT: 27 seconds (ref 24–37)

## 2015-02-16 LAB — CBG MONITORING, ED: GLUCOSE-CAPILLARY: 98 mg/dL (ref 65–99)

## 2015-02-16 LAB — I-STAT TROPONIN, ED: TROPONIN I, POC: 0.01 ng/mL (ref 0.00–0.08)

## 2015-02-16 LAB — PROTIME-INR
INR: 1.06 (ref 0.00–1.49)
PROTHROMBIN TIME: 14 s (ref 11.6–15.2)

## 2015-02-16 MED ORDER — POTASSIUM CHLORIDE CRYS ER 20 MEQ PO TBCR
20.0000 meq | EXTENDED_RELEASE_TABLET | Freq: Once | ORAL | Status: AC
  Administered 2015-02-17: 20 meq via ORAL
  Filled 2015-02-16: qty 1

## 2015-02-16 NOTE — ED Provider Notes (Signed)
CSN: [id number]     Arrival date & time 02/16/15  1934 History   First MD Initiated Contact with Patient 02/16/15 1944     Chief Complaint  Patient presents with  . Aphasia    @EDPCLEARED @ (Consider location/radiation/quality/duration/timing/severity/associated sxs/prior Treatment) Patient is a 74 y.o. female presenting with Acute Neurological Problem.  Cerebrovascular Accident This is a new problem. The current episode started today. The problem occurs constantly. The problem has been unchanged. Associated symptoms include weakness. Associated symptoms comments: Slurred speech. Nothing aggravates the symptoms. She has tried nothing for the symptoms.    Past Medical History  Diagnosis Date  . Hypertension   . Diabetes mellitus   . Fibromyalgia   . Urinary tract infection 09/19/11    Being treated at this time Cipro  . Cancer   . Lymphoma malignant, large cell    Past Surgical History  Procedure Laterality Date  . Cholecystectomy    . Appendectomy    . Rotator cuff repair    . Joint replacement    . Joint replacement    . Cataract extraction  2015   Family History  Problem Relation Age of Onset  . Heart Problems Brother   . Cancer Sister    Social History  Substance Use Topics  . Smoking status: Never Smoker   . Smokeless tobacco: None  . Alcohol Use: No   OB History    No data available     Review of Systems  Unable to perform ROS: Acuity of condition  Neurological: Positive for speech difficulty and weakness.      Allergies  Review of patient's allergies indicates no known allergies.  Home Medications   Prior to Admission medications   Medication Sig Start Date End Date Taking? Authorizing Provider  aspirin EC 81 MG tablet Take 81 mg by mouth daily.   Yes Historical Provider, MD  atenolol (TENORMIN) 50 MG tablet Take 50 mg by mouth daily.   Yes Historical Provider, MD  BENICAR HCT 40-12.5 MG per tablet Take 1 tablet by mouth daily. 11/05/14  Yes  Historical Provider, MD  BIOTIN 5000 PO Take 1 tablet by mouth daily.   Yes Historical Provider, MD  Cholecalciferol (VITAMIN D PO) Take 1,000 Units by mouth daily.   Yes Historical Provider, MD  gabapentin (NEURONTIN) 300 MG capsule Take 1 capsule (300 mg total) by mouth 3 (three) times daily. 12/16/14  Yes Melvenia Beam, MD  metFORMIN (GLUCOPHAGE) 500 MG tablet Take 500 mg by mouth daily. 12/10/14  Yes Historical Provider, MD  Multiple Vitamins-Minerals (CENTRUM SILVER PO) Take 1 tablet by mouth daily.   Yes Historical Provider, MD  Omega-3 Fatty Acids (FISH OIL PO) Take 1,200 mg by mouth daily.   Yes Historical Provider, MD  traMADol (ULTRAM) 50 MG tablet Take 1 tablet (50 mg total) by mouth every 6 (six) hours as needed. Patient taking differently: Take 50 mg by mouth every 6 (six) hours as needed for moderate pain or severe pain.  01/07/15  Yes Melvenia Beam, MD   BP 167/73 mmHg  Pulse 69  Temp(Src) 98.2 F (36.8 C) (Oral)  Ht 4\' 11"  (1.499 m)  Wt 174 lb (78.926 kg)  BMI 35.13 kg/m2  SpO2 94% Physical Exam  Constitutional: She is oriented to person, place, and time. She appears well-developed and well-nourished. No distress.  HENT:  Head: Normocephalic and atraumatic.  Eyes: EOM are normal.  Neck: Normal range of motion.  Cardiovascular: Normal rate, regular rhythm and normal  heart sounds.   No murmur heard. Pulmonary/Chest: Effort normal and breath sounds normal. No respiratory distress. She has no wheezes.  Abdominal: Soft. There is no tenderness.  Musculoskeletal: She exhibits no edema.  Neurological: She is alert and oriented to person, place, and time. She has normal strength and normal reflexes. A sensory deficit is present. No cranial nerve deficit. Coordination and gait normal. GCS eye subscore is 4. GCS verbal subscore is 5. GCS motor subscore is 6.  Skin: She is not diaphoretic.  Psychiatric: She has a normal mood and affect. Her behavior is normal.    ED Course   Procedures (including critical care time) Labs Review Labs Reviewed  COMPREHENSIVE METABOLIC PANEL - Abnormal; Notable for the following:    Potassium 3.4 (*)    Glucose, Bld 106 (*)    BUN 40 (*)    Creatinine, Ser 1.40 (*)    GFR calc non Af Amer 36 (*)    GFR calc Af Amer 42 (*)    All other components within normal limits  GLUCOSE, CAPILLARY - Abnormal; Notable for the following:    Glucose-Capillary 121 (*)    All other components within normal limits  I-STAT CHEM 8, ED - Abnormal; Notable for the following:    Potassium 3.4 (*)    BUN 38 (*)    Creatinine, Ser 1.50 (*)    Glucose, Bld 105 (*)    All other components within normal limits  URINE CULTURE  PROTIME-INR  APTT  CBC  DIFFERENTIAL  URINALYSIS, ROUTINE W REFLEX MICROSCOPIC (NOT AT [facility])  CREATININE, URINE, RANDOM  SODIUM, URINE, RANDOM  TROPONIN I  TROPONIN I  TROPONIN I  HEMOGLOBIN A1C  LIPID PANEL  I-STAT TROPOININ, ED  CBG MONITORING, ED    Imaging Review Ct Head Wo Contrast  02/16/2015   CLINICAL DATA:  Slurred speech, left-sided weakness, code stroke  EXAM: CT HEAD WITHOUT CONTRAST  TECHNIQUE: Contiguous axial images were obtained from the base of the skull through the vertex without intravenous contrast.  COMPARISON:  None.  FINDINGS: No acute hemorrhage, infarct, or mass lesion is identified. No midline shift. Ventricles are normal in size. Orbits and paranasal sinuses are unremarkable. No skull fracture.  IMPRESSION: No acute intracranial finding. Critical Value/emergent results were called by telephone at the time of interpretation on 02/16/2015 at 7:54 pm to Dr. Quintella Reichert, who verbally acknowledged these results.   Electronically Signed   By: Conchita Paris M.D.   On: 02/16/2015 19:56   I have personally reviewed and evaluated these images and lab results as part of my medical decision-making.   EKG Interpretation None      MDM   Final diagnoses:  Numbness  Slurred speech      Patient is a 74 year old female with a history of hypertension, hyperlipidemia, diabetes, lymphoma that presents from outside hospital for concern of stroke. At outside hospital patient had a CT which did not show any lesions however the patient had sudden onset slurred speech and left-sided weakness and numbness that has improved. Patient was sent here for neuro evaluation and admission. Upon arrival to the ED the patient is afebrile with stable vital signs. Patient has slowed speech that is not slurred however the patient's weakness in her left side appears to a resolved but the patient still states that she has significant numbness. The neurologist was at bedside upon initial evaluation and after evaluating the patient it was determined the patient needs to be admitted to the hospitalist  for a further neuro workup to include an MRI.    [name], MD 02/17/15 [id number]  Varney Biles, MD 02/17/15 [phone]

## 2015-02-16 NOTE — H&P (Addendum)
PCP:  [name], MD  Ne[name]  Referring provider resident [name]   Chief Complaint: Aphasia slurred speech and left-sided weakness  HPI: [name] is a 74 y.o. female   has a past medical history of Hypertension; Diabetes mellitus; Fibromyalgia; Urinary tract infection (09/19/11); Cancer; and Lymphoma malignant, large cell.   Presented with sudden onset of slurred speech and left-sided weakness when doing dishes around 7 PM soon after onset patient presented to emergency department. Altogether symptoms lasted 1 hour. NIH scale was 1 on arrival. She was seen in the emergency department by Dr. [name] neurology who recommended admission for TIA workup. No indication for tPA. Patient's risk factors include history of hypertension and diabetes.  Patient has no prior history of strokes or coronary artery disease. She is on daily aspirin 81 mg.   On arrival to the floor patient was found to be in A. Flutter. Discussed case with neurology who recommended starting Xarelto.  Patient has known hx of Neuropathy followed by Dr. [name] after receiving chemotherapy for her Lymphoma 8 years ago. Patient has ongoing history of numbness tingling and burning of the hands and feet.  On 17th of July patient undergone nerve conduction studies showing evidence of bilateral severe carpal tunnel syndrome without evidence of radiculopathy.  Patient has history of diabetes controlled on metformin.  Interval thousand 11 she was diagnosed with large cell lymphoma and was treated with chemotherapy by Dr. [name].     Hospitalist was called for admission for TIA-like symptoms  Review of Systems:    Pertinent positives include:  localizing neurological complaints. Tingling,  Weakness,  slurred speech,  Constitutional:  No weight loss, night sweats, Fevers, chills, fatigue, weight loss  HEENT:  No headaches, Difficulty swallowing,Tooth/dental problems,Sore throat,    No sneezing, itching, ear ache, nasal congestion, post nasal drip,  Cardio-vascular:  No chest pain, Orthopnea, PND, anasarca, dizziness, palpitations.no Bilateral lower extremity swelling  GI:  No heartburn, indigestion, abdominal pain, nausea, vomiting, diarrhea, change in bowel habits, loss of appetite, melena, blood in stool, hematemesis Resp:  no shortness of breath at rest. No dyspnea on exertion, No excess mucus, no productive cough, No non-productive cough, No coughing up of blood.No change in color of mucus.No wheezing. Skin:  no rash or lesions. No jaundice GU:  no dysuria, change in color of urine, no urgency or frequency. No straining to urinate.  No flank pain.  Musculoskeletal:  No joint pain or no joint swelling. No decreased range of motion. No back pain.  Psych:  No change in mood or affect. No depression or anxiety. No memory loss.  Neuro:  no double vision, no gait abnormality, no no confusion  Otherwise ROS are negative except for above, 10 systems were reviewed  Past Medical History: Past Medical History  Diagnosis Date  . Hypertension   . Diabetes mellitus   . Fibromyalgia   . Urinary tract infection 09/19/11    Being treated at this time Cipro  . Cancer   . Lymphoma malignant, large cell    Past Surgical History  Procedure Laterality Date  . Cholecystectomy    . Appendectomy    . Rotator cuff repair    . Joint replacement    . Joint replacement    . Cataract extraction  2015     Medications: Prior to Admission medications   Medication Sig Start Date End Date Taking? Authorizing Provider  aspirin EC 81 MG tablet Take 81 mg by  mouth daily.   Yes Historical Provider, MD  atenolol (TENORMIN) 50 MG tablet Take 50 mg by mouth daily.   Yes Historical Provider, MD  BENICAR HCT 40-12.5 MG per tablet Take 1 tablet by mouth daily. 11/05/14  Yes Historical Provider, MD  BIOTIN 5000 PO Take 1 tablet by mouth daily.   Yes Historical Provider, MD   Cholecalciferol (VITAMIN D PO) Take 1,000 Units by mouth daily.   Yes Historical Provider, MD  gabapentin (NEURONTIN) 300 MG capsule Take 1 capsule (300 mg total) by mouth 3 (three) times daily. 12/16/14  Yes Melvenia Beam, MD  metFORMIN (GLUCOPHAGE) 500 MG tablet Take 500 mg by mouth daily. 12/10/14  Yes Historical Provider, MD  Multiple Vitamins-Minerals (CENTRUM SILVER PO) Take 1 tablet by mouth daily.   Yes Historical Provider, MD  Omega-3 Fatty Acids (FISH OIL PO) Take 1,200 mg by mouth daily.   Yes Historical Provider, MD  traMADol (ULTRAM) 50 MG tablet Take 1 tablet (50 mg total) by mouth every 6 (six) hours as needed. Patient taking differently: Take 50 mg by mouth every 6 (six) hours as needed for moderate pain or severe pain.  01/07/15  Yes Melvenia Beam, MD    Allergies:  No Known Allergies  Social History:  Ambulatory   independently walker cane, wheelchair bound, bed bound Lives at home With family     reports that she has never smoked. She does not have any smokeless tobacco history on file. She reports that she does not drink alcohol or use illicit drugs.    Family History: family history includes Cancer in her sister; Heart Problems in her brother.    Physical Exam: Patient Vitals for the past 24 hrs:  BP Temp Temp src Pulse Resp SpO2 Height Weight  02/16/15 2141 137/72 mmHg 98.4 F (36.9 C) Oral 81 18 97 % - -  02/16/15 2107 - 97.8 F (36.6 C) - - - - - -  02/16/15 2100 136/67 mmHg - - 63 16 95 % - -  02/16/15 2045 139/63 mmHg - - (!) 59 16 97 % - -  02/16/15 2031 157/71 mmHg 98 F (36.7 C) Oral 62 16 98 % - -  02/16/15 1939 167/73 mmHg 98.2 F (36.8 C) Oral 69 - 94 % 4\' 11"  (1.499 m) 78.926 kg (174 lb)    1. General:  in No Acute distress 2. Psychological: Alert and   Oriented 3. Head/ENT:   Moist   Mucous Membranes                          Head Non traumatic, neck supple                          Normal  Dentition 4. SKIN: normal  Skin turgor,  Skin  clean Dry and intact no rash 5. Heart: Regular rate and rhythm no Murmur, Rub or gallop 6. Lungs: Clear to auscultation bilaterally, no wheezes or crackles   7. Abdomen: Soft, non-tender, Non distended 8. Lower extremities: no clubbing, cyanosis, or edema 9. Neurologically diminished strength on the left with minimal Right facial droop. Normal speech,.  10. MSK: Normal range of motion  body mass index is 35.13 kg/(m^2).   Labs on Admission:   Results for orders placed or performed during the hospital encounter of 02/16/15 (from the past 24 hour(s))  Protime-INR     Status: None   Collection Time:  02/16/15  7:58 PM  Result Value Ref Range   Prothrombin Time 14.0 11.6 - 15.2 seconds   INR 1.06 0.00 - 1.49  APTT     Status: None   Collection Time: 02/16/15  7:58 PM  Result Value Ref Range   aPTT 27 24 - 37 seconds  CBC     Status: None   Collection Time: 02/16/15  7:58 PM  Result Value Ref Range   WBC 8.1 4.0 - 10.5 K/uL   RBC 4.23 3.87 - 5.11 MIL/uL   Hemoglobin 13.0 12.0 - 15.0 g/dL   HCT 39.3 36.0 - 46.0 %   MCV 92.9 78.0 - 100.0 fL   MCH 30.7 26.0 - 34.0 pg   MCHC 33.1 30.0 - 36.0 g/dL   RDW 14.0 11.5 - 15.5 %   Platelets 237 150 - 400 K/uL  Differential     Status: None   Collection Time: 02/16/15  7:58 PM  Result Value Ref Range   Neutrophils Relative % 54 43 - 77 %   Neutro Abs 4.4 1.7 - 7.7 K/uL   Lymphocytes Relative 33 12 - 46 %   Lymphs Abs 2.7 0.7 - 4.0 K/uL   Monocytes Relative 8 3 - 12 %   Monocytes Absolute 0.7 0.1 - 1.0 K/uL   Eosinophils Relative 4 0 - 5 %   Eosinophils Absolute 0.3 0.0 - 0.7 K/uL   Basophils Relative 1 0 - 1 %   Basophils Absolute 0.0 0.0 - 0.1 K/uL  Comprehensive metabolic panel     Status: Abnormal   Collection Time: 02/16/15  7:58 PM  Result Value Ref Range   Sodium 136 135 - 145 mmol/L   Potassium 3.4 (L) 3.5 - 5.1 mmol/L   Chloride 102 101 - 111 mmol/L   CO2 24 22 - 32 mmol/L   Glucose, Bld 106 (H) 65 - 99 mg/dL   BUN 40 (H)  6 - 20 mg/dL   Creatinine, Ser 1.40 (H) 0.44 - 1.00 mg/dL   Calcium 9.1 8.9 - 10.3 mg/dL   Total Protein 7.0 6.5 - 8.1 g/dL   Albumin 4.0 3.5 - 5.0 g/dL   AST 27 15 - 41 U/L   ALT 31 14 - 54 U/L   Alkaline Phosphatase 93 38 - 126 U/L   Total Bilirubin 0.9 0.3 - 1.2 mg/dL   GFR calc non Af Amer 36 (L) >60 mL/min   GFR calc Af Amer 42 (L) >60 mL/min   Anion gap 10 5 - 15  I-stat troponin, ED (not at [facility], [facility])     Status: None   Collection Time: 02/16/15  8:11 PM  Result Value Ref Range   Troponin i, poc 0.01 0.00 - 0.08 ng/mL   Comment 3          I-Stat Chem 8, ED  (not at [facility], [facility])     Status: Abnormal   Collection Time: 02/16/15  8:12 PM  Result Value Ref Range   Sodium 138 135 - 145 mmol/L   Potassium 3.4 (L) 3.5 - 5.1 mmol/L   Chloride 101 101 - 111 mmol/L   BUN 38 (H) 6 - 20 mg/dL   Creatinine, Ser 1.50 (H) 0.44 - 1.00 mg/dL   Glucose, Bld 105 (H) 65 - 99 mg/dL   Calcium, Ion 1.14 1.13 - 1.30 mmol/L   TCO2 25 0 - 100 mmol/L   Hemoglobin 13.6 12.0 - 15.0 g/dL   HCT 40.0 36.0 - 46.0 %  CBG  monitoring, ED     Status: None   Collection Time: 02/16/15  9:28 PM  Result Value Ref Range   Glucose-Capillary 98 65 - 99 mg/dL    UA odered  Lab Results  Component Value Date   HGBA1C 6.4* 12/16/2014    Estimated Creatinine Clearance: 30.3 mL/min (by C-G formula based on Cr of 1.5).  BNP (last 3 results) No results for input(s): PROBNP in the last 8760 hours.  Other results:  I have pearsonaly reviewed this: ECG REPORT  Rate: 66   Rhythm: NSR ST&T Change: t wave flattening QTC 593    Filed Weights   02/16/15 1939  Weight: 78.926 kg (174 lb)     Cultures:    Component Value Date/Time   SDES BLOOD LEFT ARM 11/21/2009 1840   SPECREQUEST IMMUNE:NORM 11/21/2009 1840   CULT NO ACID FAST BACILLI ISOLATED IN 6 WEEKS 11/21/2009 1840   REPTSTATUS 01/04/2010 FINAL 11/21/2009 1840     Radiological Exams on Admission: Ct Head Wo Contrast  02/16/2015   CLINICAL  DATA:  Slurred speech, left-sided weakness, code stroke  EXAM: CT HEAD WITHOUT CONTRAST  TECHNIQUE: Contiguous axial images were obtained from the base of the skull through the vertex without intravenous contrast.  COMPARISON:  None.  FINDINGS: No acute hemorrhage, infarct, or mass lesion is identified. No midline shift. Ventricles are normal in size. Orbits and paranasal sinuses are unremarkable. No skull fracture.  IMPRESSION: No acute intracranial finding. Critical Value/emergent results were called by telephone at the time of interpretation on 02/16/2015 at 7:54 pm to Dr. Quintella Reichert, who verbally acknowledged these results.   Electronically Signed   By: Conchita Paris M.D.   On: 02/16/2015 19:56    Chart has been reviewed  Family  at  Bedside  plan of care was discussed with  Daughter, Catalina Lunger [phone]  Assessment/Plan  74 yo F with hx of DM HTN new diagnosed a.flutter presents with aphasia and Left sided weakness with possible TIA   Present on Admission:   . TIA (transient ischemic attack) -   will admit based on TIA/CVA protocol, await results of MRA/MRI, Carotid Doppler and Echo, obtain cardiac enzymes,  ECG,   Lipid panel, TSH. Order PT/OT evaluation. Will make sure patient is on antiplatelet agent.   Neurology consult.     . Atrial flutter - will order echo, start on anticoagulation given Chad2 Vasc score of 6.  Currently rate controlled but with occasional drops down to 50's the goes up to 100. Given renal insufficiency will DC atenolol and use lower dose of metoprolol with holding parameters.  Will contiue to monitor on tele if evidence of symptomatic Bradycardia or pauses will need cardiology consult.  . Acute renal failure - Will rehydrate, hold nephrotoxic meds, check urine elctrolytes DM 2 with renal complications - hod metformin order SSI   Prophylaxis: Xarelto  CODE STATUS:  FULL CODE  as per patient    Disposition:   To home once workup is complete and patient  is stable  Other plan as per orders.  I have spent a total of 75 min on this admission extra time spent discussing case with neurology and pharmacy  [facility] 02/16/2015, 10:53 PM  [facility]  Pager [phone]   after 2 AM please page floor coverage PA If 7AM-7PM, please contact the day team taking care of the patient  [email or web address]  Password [email or web address]

## 2015-02-16 NOTE — ED Notes (Signed)
Returned from CT.

## 2015-02-16 NOTE — ED Notes (Signed)
Pt was doing the dishes and suddenly had slurred speech and left sided weakness, symptoms have resolved some but not back to normal, last seen normal about 20 minutes ago

## 2015-02-16 NOTE — ED Provider Notes (Signed)
CSN: [id number]     Arrival date & time 02/16/15  1934 History   First MD Initiated Contact with Patient 02/16/15 1944     Chief Complaint  Patient presents with  . Aphasia    An emergency department physician performed an initial assessment on this suspected stroke patient at 33.  The history is provided by the patient. No language interpreter was used.   Pt here for evaluation of left sided weakness, speech difficulties.  Sxs started at 7pm this evening while doing dishes. Symptoms are persistent but improving overall. She has a history of hypertension, diabetes. No history of prior CVA. No recent injuries.  Past Medical History  Diagnosis Date  . Hypertension   . Diabetes mellitus   . Fibromyalgia   . Urinary tract infection 09/19/11    Being treated at this time Cipro  . Cancer   . Lymphoma malignant, large cell    Past Surgical History  Procedure Laterality Date  . Cholecystectomy    . Appendectomy    . Rotator cuff repair    . Joint replacement    . Joint replacement    . Cataract extraction  2015   Family History  Problem Relation Age of Onset  . Heart Problems Brother   . Cancer Sister    Social History  Substance Use Topics  . Smoking status: Never Smoker   . Smokeless tobacco: None  . Alcohol Use: No   OB History    No data available     Review of Systems  All other systems reviewed and are negative.     Allergies  Review of patient's allergies indicates no known allergies.  Home Medications   Prior to Admission medications   Medication Sig Start Date End Date Taking? Authorizing Provider  aspirin EC 81 MG tablet Take 81 mg by mouth daily.   Yes Historical Provider, MD  atenolol (TENORMIN) 50 MG tablet Take 50 mg by mouth daily.   Yes Historical Provider, MD  BENICAR HCT 40-12.5 MG per tablet Take 1 tablet by mouth daily. 11/05/14  Yes Historical Provider, MD  BIOTIN 5000 PO Take 1 tablet by mouth daily.   Yes Historical Provider, MD   Cholecalciferol (VITAMIN D PO) Take 1,000 Units by mouth daily.   Yes Historical Provider, MD  gabapentin (NEURONTIN) 300 MG capsule Take 1 capsule (300 mg total) by mouth 3 (three) times daily. 12/16/14  Yes Melvenia Beam, MD  Magnesium 500 MG CAPS Take 1 capsule by mouth daily.   Yes Historical Provider, MD  metFORMIN (GLUCOPHAGE) 500 MG tablet Take 500 mg by mouth daily. 12/10/14  Yes Historical Provider, MD  Multiple Vitamins-Minerals (CENTRUM SILVER PO) Take 1 tablet by mouth daily.   Yes Historical Provider, MD  Omega-3 Fatty Acids (FISH OIL PO) Take 1,200 mg by mouth daily.   Yes Historical Provider, MD  traMADol (ULTRAM) 50 MG tablet Take 1 tablet (50 mg total) by mouth every 6 (six) hours as needed. 01/07/15   Melvenia Beam, MD   BP 167/73 mmHg  Pulse 69  Temp(Src) 98.2 F (36.8 C) (Oral)  Ht 4\' 11"  (1.499 m)  Wt 174 lb (78.926 kg)  BMI 35.13 kg/m2  SpO2 94% Physical Exam  Constitutional: She is oriented to person, place, and time. She appears well-developed and well-nourished.  HENT:  Head: Normocephalic and atraumatic.  Eyes: EOM are normal. Pupils are equal, round, and reactive to light.  Cardiovascular: Normal rate and regular rhythm.   No  murmur heard. Pulmonary/Chest: Effort normal and breath sounds normal. No respiratory distress.  Abdominal: Soft. There is no tenderness. There is no rebound and no guarding.  Musculoskeletal: She exhibits no edema or tenderness.  Neurological: She is alert and oriented to person, place, and time.  4+ out of 5 strength in left upper extremity, very mild right facial weakness. No pronator drift.  Skin: Skin is warm and dry.  Psychiatric: She has a normal mood and affect. Her behavior is normal.  Nursing note and vitals reviewed.   ED Course  Procedures (including critical care time) Labs Review Labs Reviewed  PROTIME-INR  APTT  CBC  DIFFERENTIAL  COMPREHENSIVE METABOLIC PANEL  I-STAT TROPOININ, ED  CBG MONITORING, ED   I-STAT CHEM 8, ED    Imaging Review No results found. I have personally reviewed and evaluated these images and lab results as part of my medical decision-making.   EKG Interpretation None      MDM   Final diagnoses:  Numbness  Slurred speech   Pt here for evaluation of weakness, speech difficulties. Code stroke activated.  Discussed with Dr. Nicole Kindred with Neurology who recommends the patient be transferred to [facility]. Discussed with Dr. Rex Kras in the [facility] ED, accepts in transfer. Discussed the patient and husband plan to transfer to [facility] for further evaluation.  Quintella Reichert, MD 02/17/15 [phone]

## 2015-02-16 NOTE — Consult Note (Signed)
Admission H&P    Chief Complaint: Dizziness with left-sided weakness and numbness.  HPI: [name] is an 74 y.o. female with a history of hypertension and diabetes mellitus, brought to the emergency room and code stroke status following acute onset of dizziness along with weakness and numbness involving left side. Numbness was most pronounced in the left arm and hand. Speech reportedly was slurred and face was drawn to one side. Patient has no history of stroke nor TIA. She's been on aspirin 81 mg per day. CT scan of her head showed no acute intracranial abnormality. Symptoms began to improve prior to arriving in the emergency room. NIH stroke score at the time of this evaluation was 1.  LSN: 7:10 PM on 02/16/2015 tPA Given: No: Resolving deficits with minimal residua mRankin:  Past Medical History  Diagnosis Date  . Hypertension   . Diabetes mellitus   . Fibromyalgia   . Urinary tract infection 09/19/11    Being treated at this time Cipro  . Cancer   . Lymphoma malignant, large cell     Past Surgical History  Procedure Laterality Date  . Cholecystectomy    . Appendectomy    . Rotator cuff repair    . Joint replacement    . Joint replacement    . Cataract extraction  2015    Family History  Problem Relation Age of Onset  . Heart Problems Brother   . Cancer Sister   Family history is also positive for stroke.  Social History:  reports that she has never smoked. She does not have any smokeless tobacco history on file. She reports that she does not drink alcohol or use illicit drugs.  Allergies: No Known Allergies  Medications: Patient's preadmission medications were reviewed by me.  ROS: History obtained from the patient  General ROS: negative for - chills, fatigue, fever, night sweats, weight gain or weight loss Psychological ROS: negative for - behavioral disorder, hallucinations, memory difficulties, mood swings or suicidal ideation Ophthalmic ROS: negative  for - blurry vision, double vision, eye pain or loss of vision ENT ROS: negative for - epistaxis, nasal discharge, oral lesions, sore throat, tinnitus or vertigo Allergy and Immunology ROS: negative for - hives or itchy/watery eyes Hematological and Lymphatic ROS: negative for - bleeding problems, bruising or swollen lymph nodes Endocrine ROS: negative for - galactorrhea, hair pattern changes, polydipsia/polyuria or temperature intolerance Respiratory ROS: negative for - cough, hemoptysis, shortness of breath or wheezing Cardiovascular ROS: negative for - chest pain, dyspnea on exertion, edema or irregular heartbeat Gastrointestinal ROS: negative for - abdominal pain, diarrhea, hematemesis, nausea/vomiting or stool incontinence Genito-Urinary ROS: negative for - dysuria, hematuria, incontinence or urinary frequency/urgency Musculoskeletal ROS: negative for - joint swelling or muscular weakness Neurological ROS: as noted in HPI Dermatological ROS: negative for rash and skin lesion changes  Physical Examination: Blood pressure 157/71, pulse 62, temperature 98 F (36.7 C), temperature source Oral, resp. rate 16, height 4' 11" (1.499 m), weight 78.926 kg (174 lb), SpO2 98 %.  HEENT-  Normocephalic, no lesions, without obvious abnormality.  Normal external eye and conjunctiva.  Normal TM's bilaterally.  Normal auditory canals and external ears. Normal external nose, mucus membranes and septum.  Normal pharynx. Neck supple with no masses, nodes, nodules or enlargement. Cardiovascular - regular rate and rhythm, S1, S2 normal, no murmur, click, rub or gallop Lungs - chest clear, no wheezing, rales, normal symmetric air entry Abdomen - soft, non-tender; bowel sounds normal; no masses,  no  organomegaly Extremities - no edema and no skin discoloration  Neurologic Examination: Mental Status: Alert, oriented, thought content appropriate.  Speech fluent without evidence of aphasia. Able to follow  commands without difficulty. Cranial Nerves: II-Visual fields were normal. III/IV/VI-Pupils were equal and reacted. Extraocular movements were full and conjugate.    V/VII-no facial numbness and no facial weakness. VIII-normal. X-normal speech and symmetrical palatal movement. XI: trapezius strength/neck flexion strength normal bilaterally XII-midline tongue extension with normal strength. Motor: 5/5 bilaterally with normal tone and bulk Sensory: Reduced perception of tactile sensation over left side of the face and left upper extremity, as well as distally in both lower extremities. Deep Tendon Reflexes: 1+ and symmetric. Plantars: Flexor bilaterally Cerebellar: Normal finger-to-nose testing. Carotid auscultation: Normal  Results for orders placed or performed during the hospital encounter of 02/16/15 (from the past 48 hour(s))  Protime-INR     Status: None   Collection Time: 02/16/15  7:58 PM  Result Value Ref Range   Prothrombin Time 14.0 11.6 - 15.2 seconds   INR 1.06 0.00 - 1.49  APTT     Status: None   Collection Time: 02/16/15  7:58 PM  Result Value Ref Range   aPTT 27 24 - 37 seconds  CBC     Status: None   Collection Time: 02/16/15  7:58 PM  Result Value Ref Range   WBC 8.1 4.0 - 10.5 K/uL   RBC 4.23 3.87 - 5.11 MIL/uL   Hemoglobin 13.0 12.0 - 15.0 g/dL   HCT 39.3 36.0 - 46.0 %   MCV 92.9 78.0 - 100.0 fL   MCH 30.7 26.0 - 34.0 pg   MCHC 33.1 30.0 - 36.0 g/dL   RDW 14.0 11.5 - 15.5 %   Platelets 237 150 - 400 K/uL  Differential     Status: None   Collection Time: 02/16/15  7:58 PM  Result Value Ref Range   Neutrophils Relative % 54 43 - 77 %   Neutro Abs 4.4 1.7 - 7.7 K/uL   Lymphocytes Relative 33 12 - 46 %   Lymphs Abs 2.7 0.7 - 4.0 K/uL   Monocytes Relative 8 3 - 12 %   Monocytes Absolute 0.7 0.1 - 1.0 K/uL   Eosinophils Relative 4 0 - 5 %   Eosinophils Absolute 0.3 0.0 - 0.7 K/uL   Basophils Relative 1 0 - 1 %   Basophils Absolute 0.0 0.0 - 0.1 K/uL   Comprehensive metabolic panel     Status: Abnormal   Collection Time: 02/16/15  7:58 PM  Result Value Ref Range   Sodium 136 135 - 145 mmol/L   Potassium 3.4 (L) 3.5 - 5.1 mmol/L   Chloride 102 101 - 111 mmol/L   CO2 24 22 - 32 mmol/L   Glucose, Bld 106 (H) 65 - 99 mg/dL   BUN 40 (H) 6 - 20 mg/dL   Creatinine, Ser 1.40 (H) 0.44 - 1.00 mg/dL   Calcium 9.1 8.9 - 10.3 mg/dL   Total Protein 7.0 6.5 - 8.1 g/dL   Albumin 4.0 3.5 - 5.0 g/dL   AST 27 15 - 41 U/L   ALT 31 14 - 54 U/L   Alkaline Phosphatase 93 38 - 126 U/L   Total Bilirubin 0.9 0.3 - 1.2 mg/dL   GFR calc non Af Amer 36 (L) >60 mL/min   GFR calc Af Amer 42 (L) >60 mL/min    Comment: (NOTE) The eGFR has been calculated using the CKD EPI equation. This calculation  has not been validated in all clinical situations. eGFR's persistently <60 mL/min signify possible Chronic Kidney Disease.    Anion gap 10 5 - 15  I-stat troponin, ED (not at [facility], [facility])     Status: None   Collection Time: 02/16/15  8:11 PM  Result Value Ref Range   Troponin i, poc 0.01 0.00 - 0.08 ng/mL   Comment 3            Comment: Due to the release kinetics of cTnI, a negative result within the first hours of the onset of symptoms does not rule out myocardial infarction with certainty. If myocardial infarction is still suspected, repeat the test at appropriate intervals.   I-Stat Chem 8, ED  (not at [facility], [facility])     Status: Abnormal   Collection Time: 02/16/15  8:12 PM  Result Value Ref Range   Sodium 138 135 - 145 mmol/L   Potassium 3.4 (L) 3.5 - 5.1 mmol/L   Chloride 101 101 - 111 mmol/L   BUN 38 (H) 6 - 20 mg/dL   Creatinine, Ser 1.50 (H) 0.44 - 1.00 mg/dL   Glucose, Bld 105 (H) 65 - 99 mg/dL   Calcium, Ion 1.14 1.13 - 1.30 mmol/L   TCO2 25 0 - 100 mmol/L   Hemoglobin 13.6 12.0 - 15.0 g/dL   HCT 40.0 36.0 - 46.0 %   Ct Head Wo Contrast  02/16/2015   CLINICAL DATA:  Slurred speech, left-sided weakness, code stroke  EXAM: CT HEAD WITHOUT  CONTRAST  TECHNIQUE: Contiguous axial images were obtained from the base of the skull through the vertex without intravenous contrast.  COMPARISON:  None.  FINDINGS: No acute hemorrhage, infarct, or mass lesion is identified. No midline shift. Ventricles are normal in size. Orbits and paranasal sinuses are unremarkable. No skull fracture.  IMPRESSION: No acute intracranial finding. Critical Value/emergent results were called by telephone at the time of interpretation on 02/16/2015 at 7:54 pm to Dr. Quintella Reichert, who verbally acknowledged these results.   Electronically Signed   By: Conchita Paris M.D.   On: 02/16/2015 19:56    Assessment: 75 y.o. female with multiple risk factors for stroke presenting with TIA or possible small subcortical right MCA territory ischemic infarction.  Stroke Risk Factors - diabetes mellitus, family history and hypertension  Plan: 1. HgbA1c, fasting lipid panel 2. MRI, MRA  of the brain without contrast 3. PT consult, OT consult, Speech consult 4. Echocardiogram 5. Carotid dopplers 6. Prophylactic therapy-Antiplatelet med: Aspirin  7. Risk factor modification 8. Telemetry monitoring  C.R. Nicole Kindred, MD [facility] [phone]  02/16/2015, 8:50 PM

## 2015-02-16 NOTE — ED Notes (Signed)
Pt has left with [facility] for [facility] ED for Dr. Chiquita Loth (neurologist) to exam. Gave report to [facility].

## 2015-02-16 NOTE — Progress Notes (Addendum)
ANTICOAGULATION CONSULT NOTE - Follow-Up Consult  Pharmacy Consult for  >> Eliquis Indication: atrial fibrillation  No Known Allergies  Patient Measurements: Height: 4\' 11"  (149.9 cm) Weight: 179 lb 4.8 oz (81.33 kg) IBW/kg (Calculated) : 43.2  Vital Signs: Temp: 98.2 F (36.8 C) (08/29 2300) Temp Source: Oral (08/29 2300) BP: 134/95 mmHg (08/29 2300) Pulse Rate: 78 (08/29 2300)  Labs:  Recent Labs  02/16/15 1958 02/16/15 2012  HGB 13.0 13.6  HCT 39.3 40.0  PLT 237  --   APTT 27  --   LABPROT 14.0  --   INR 1.06  --   CREATININE 1.40* 1.50*    Estimated Creatinine Clearance: 30.8 mL/min (by C-G formula based on Cr of 1.5).   Medical History: Past Medical History  Diagnosis Date  . Hypertension   . Diabetes mellitus   . Fibromyalgia   . Urinary tract infection 09/19/11    Being treated at this time Cipro  . Cancer   . Lymphoma malignant, large cell     Medications:  Prescriptions prior to admission  Medication Sig Dispense Refill Last Dose  . aspirin EC 81 MG tablet Take 81 mg by mouth daily.   02/15/2015 at Unknown time  . atenolol (TENORMIN) 50 MG tablet Take 50 mg by mouth daily.   02/16/2015 at 800  . BENICAR HCT 40-12.5 MG per tablet Take 1 tablet by mouth daily.   02/16/2015 at Unknown time  . BIOTIN 5000 PO Take 1 tablet by mouth daily.   02/15/2015 at Unknown time  . Cholecalciferol (VITAMIN D PO) Take 1,000 Units by mouth daily.   02/16/2015 at Unknown time  . gabapentin (NEURONTIN) 300 MG capsule Take 1 capsule (300 mg total) by mouth 3 (three) times daily. 90 capsule 11 02/16/2015 at Unknown time  . metFORMIN (GLUCOPHAGE) 500 MG tablet Take 500 mg by mouth daily.   02/16/2015 at Unknown time  . Multiple Vitamins-Minerals (CENTRUM SILVER PO) Take 1 tablet by mouth daily.   02/15/2015 at Unknown time  . Omega-3 Fatty Acids (FISH OIL PO) Take 1,200 mg by mouth daily.   02/15/2015 at Unknown time  . traMADol (ULTRAM) 50 MG tablet Take 1 tablet (50 mg total) by  mouth every 6 (six) hours as needed. (Patient taking differently: Take 50 mg by mouth every 6 (six) hours as needed for moderate pain or severe pain. ) 50 tablet 4 1 week ago   Scheduled:  . potassium chloride  20 mEq Oral Once    Assessment: 74yo female presents w/ sudden onset of slurred speech and left-sided weakness, sx mostly resolved spontaneously within minutes though not to baseline, admitted for TIA vs small ischemic infarction, on admission to floor pt found to be in Aflutter/Afib, to begin anticoagulation as this may have been the cause of sx; spoke w/ Dr Roel Cluck who confirms that neuro is in agreement re: starting Xarelto.  Of note, pt's SCr is elevated to 1.4, was 1.09 39mo ago.  Plan:  Will begin Xarelto 15mg  po daily for now given renal function and begin Xarelto education.   Wynona Neat, PharmD, BCPS  02/16/2015,11:57 PM  _______________________________________________________ Jones Skene: Pt to switch to Eliquis given AKI.  Most recent SCr 1.5, CrCl ~ 30 mL/min.  Pt qualifies for full dose Eliquis (age < 9yo, wt > 60kg).  Note, pt received one dose of Xarelto early this AM.  H&H and PLT wnl, no bleeding reported.  Plan: -Discontinue Xarelto -Start Eliquis 5mg  BID starting tonight 8/30 -Will  provide education prior to discharge  Drucie Opitz, PharmD Clinical Pharmacist Pager: [phone] 02/17/2015 11:09 AM

## 2015-02-16 NOTE — Progress Notes (Signed)
Pt arrived to room 5M03 from ED. Pt is alert and oriented.  Family at bedside.  Safety measures in place.  Will continue to monitor.   Fredrich Romans, RN

## 2015-02-16 NOTE — ED Notes (Signed)
Attempted to call report x2

## 2015-02-17 ENCOUNTER — Inpatient Hospital Stay (HOSPITAL_COMMUNITY): Payer: Medicare Other

## 2015-02-17 DIAGNOSIS — I63411 Cerebral infarction due to embolism of right middle cerebral artery: Secondary | ICD-10-CM | POA: Insufficient documentation

## 2015-02-17 DIAGNOSIS — I639 Cerebral infarction, unspecified: Secondary | ICD-10-CM

## 2015-02-17 DIAGNOSIS — E785 Hyperlipidemia, unspecified: Secondary | ICD-10-CM | POA: Insufficient documentation

## 2015-02-17 DIAGNOSIS — E1129 Type 2 diabetes mellitus with other diabetic kidney complication: Secondary | ICD-10-CM

## 2015-02-17 DIAGNOSIS — I4891 Unspecified atrial fibrillation: Secondary | ICD-10-CM

## 2015-02-17 DIAGNOSIS — I4892 Unspecified atrial flutter: Secondary | ICD-10-CM

## 2015-02-17 LAB — GLUCOSE, CAPILLARY
GLUCOSE-CAPILLARY: 108 mg/dL — AB (ref 65–99)
Glucose-Capillary: 108 mg/dL — ABNORMAL HIGH (ref 65–99)
Glucose-Capillary: 113 mg/dL — ABNORMAL HIGH (ref 65–99)
Glucose-Capillary: 121 mg/dL — ABNORMAL HIGH (ref 65–99)
Glucose-Capillary: 85 mg/dL (ref 65–99)
Glucose-Capillary: 98 mg/dL (ref 65–99)

## 2015-02-17 LAB — LIPID PANEL
CHOLESTEROL: 182 mg/dL (ref 0–200)
HDL: 50 mg/dL (ref >40)
LDL Cholesterol: 120 mg/dL — ABNORMAL HIGH (ref 0–99)
TRIGLYCERIDES: 61 mg/dL (ref <150)
Total CHOL/HDL Ratio: 3.6 RATIO
VLDL: 12 mg/dL (ref 0–40)

## 2015-02-17 LAB — OSMOLALITY: OSMOLALITY: 303 mosm/kg — AB (ref 275–300)

## 2015-02-17 LAB — TROPONIN I: Troponin I: <0.03 ng/mL (ref <0.031)

## 2015-02-17 LAB — TSH: TSH: 1.925 u[IU]/mL (ref 0.350–4.500)

## 2015-02-17 LAB — SODIUM, URINE, RANDOM
SODIUM UR: 67 mmol/L
SODIUM UR: 55 mmol/L

## 2015-02-17 LAB — URINALYSIS, ROUTINE W REFLEX MICROSCOPIC
BILIRUBIN URINE: NEGATIVE
GLUCOSE, UA: NEGATIVE mg/dL
Hgb urine dipstick: NEGATIVE
KETONES UR: NEGATIVE mg/dL
Leukocytes, UA: NEGATIVE
Nitrite: NEGATIVE
PH: 6.5 (ref 5.0–8.0)
Protein, ur: NEGATIVE mg/dL
Specific Gravity, Urine: 1.009 (ref 1.005–1.030)
Urobilinogen, UA: 0.2 mg/dL (ref 0.0–1.0)

## 2015-02-17 LAB — OSMOLALITY, URINE: Osmolality, Ur: 361 mOsm/kg — ABNORMAL LOW (ref 390–1090)

## 2015-02-17 LAB — CREATININE, URINE, RANDOM
Creatinine, Urine: 38.56 mg/dL
Creatinine, Urine: 40.71 mg/dL

## 2015-02-17 MED ORDER — ATORVASTATIN CALCIUM 10 MG PO TABS
20.0000 mg | ORAL_TABLET | Freq: Every day | ORAL | Status: DC
  Administered 2015-02-17: 20 mg via ORAL
  Filled 2015-02-17: qty 2

## 2015-02-17 MED ORDER — METOPROLOL TARTRATE 12.5 MG HALF TABLET: 12.5000 mg | ORAL_TABLET | Freq: Two times a day (BID) | ORAL | Status: DC

## 2015-02-17 MED ORDER — TRAMADOL HCL 50 MG PO TABS: 50.0000 mg | ORAL_TABLET | Freq: Four times a day (QID) | ORAL | Status: DC | PRN

## 2015-02-17 MED ORDER — ACETAMINOPHEN 650 MG RE SUPP: 650.0000 mg | RECTAL | Status: DC | PRN

## 2015-02-17 MED ORDER — ALPRAZOLAM 0.5 MG PO TABS
0.5000 mg | ORAL_TABLET | Freq: Every evening | ORAL | Status: DC | PRN
  Administered 2015-02-17: 0.5 mg via ORAL
  Filled 2015-02-17: qty 1

## 2015-02-17 MED ORDER — ASPIRIN EC 81 MG PO TBEC
81.0000 mg | DELAYED_RELEASE_TABLET | Freq: Every day | ORAL | Status: DC
  Administered 2015-02-17: 81 mg via ORAL
  Filled 2015-02-17: qty 1

## 2015-02-17 MED ORDER — APIXABAN 5 MG PO TABS
5.0000 mg | ORAL_TABLET | Freq: Two times a day (BID) | ORAL | Status: DC
  Administered 2015-02-17 – 2015-02-18 (×2): 5 mg via ORAL
  Filled 2015-02-17 (×2): qty 1

## 2015-02-17 MED ORDER — POTASSIUM CHLORIDE CRYS ER 20 MEQ PO TBCR
20.0000 meq | EXTENDED_RELEASE_TABLET | Freq: Once | ORAL | Status: AC
  Administered 2015-02-17: 20 meq via ORAL
  Filled 2015-02-17: qty 1

## 2015-02-17 MED ORDER — RIVAROXABAN 15 MG PO TABS
15.0000 mg | ORAL_TABLET | Freq: Every day | ORAL | Status: DC
  Administered 2015-02-17: 15 mg via ORAL
  Filled 2015-02-17: qty 1

## 2015-02-17 MED ORDER — SODIUM CHLORIDE 0.9 % IV SOLN
INTRAVENOUS | Status: DC
  Administered 2015-02-17 (×2): via INTRAVENOUS

## 2015-02-17 MED ORDER — STROKE: EARLY STAGES OF RECOVERY BOOK
Freq: Once | Status: AC
  Administered 2015-02-17: 02:00:00

## 2015-02-17 MED ORDER — SENNOSIDES-DOCUSATE SODIUM 8.6-50 MG PO TABS: 1.0000 | ORAL_TABLET | Freq: Every evening | ORAL | Status: DC | PRN

## 2015-02-17 MED ORDER — GABAPENTIN 300 MG PO CAPS
300.0000 mg | ORAL_CAPSULE | Freq: Three times a day (TID) | ORAL | Status: DC
  Administered 2015-02-17 – 2015-02-18 (×5): 300 mg via ORAL
  Filled 2015-02-17 (×5): qty 1

## 2015-02-17 MED ORDER — ACETAMINOPHEN 325 MG PO TABS: 650.0000 mg | ORAL_TABLET | ORAL | Status: DC | PRN

## 2015-02-17 MED ORDER — INSULIN ASPART 100 UNIT/ML ~~LOC~~ SOLN
0.0000 [IU] | SUBCUTANEOUS | Status: DC
  Administered 2015-02-17: 1 [IU] via SUBCUTANEOUS

## 2015-02-17 NOTE — Progress Notes (Signed)
OT Cancellation Note  Patient Details Name: [name] MRN: [id number] DOB: February 15, 1941   Cancelled Treatment:    Reason Eval/Treat Not Completed: Patient at procedure or test/ unavailable. Pt is at MRI. Will reattempt OT evaluation later today as schedule permits.   Hortencia Pilar 02/17/2015, 9:04 AM

## 2015-02-17 NOTE — Progress Notes (Signed)
STROKE TEAM PROGRESS NOTE   SUBJECTIVE (INTERVAL HISTORY) Her husband and daughter are at the bedside.  Overall she feels her condition is stable. She has new diagnosed aflutter, put on Xarelto 15mg , due to Cre 1.5, will change to Eliquis 5mg  bid. Will d/c ASA 81mg .    OBJECTIVE Temp:  [97.7 F (36.5 C)-98.4 F (36.9 C)] 97.7 F (36.5 C) (08/30 1014) Pulse Rate:  [54-81] 57 (08/30 1014) Cardiac Rhythm:  [-] Sinus bradycardia (08/30 0700) Resp:  [16-18] 18 (08/30 1014) BP: (124-167)/(59-95) 125/59 mmHg (08/30 1014) SpO2:  [94 %-100 %] 95 % (08/30 1014) Weight:  [78.926 kg (174 lb)-81.33 kg (179 lb 4.8 oz)] 81.33 kg (179 lb 4.8 oz) (08/29 2200)  CBC:  Recent Labs Lab 02/16/15 1958 02/16/15 2012  WBC 8.1  --   NEUTROABS 4.4  --   HGB 13.0 13.6  HCT 39.3 40.0  MCV 92.9  --   PLT 237  --    Basic Metabolic Panel:  Recent Labs Lab 02/16/15 1958 02/16/15 2012  NA 136 138  K 3.4* 3.4*  CL 102 101  CO2 24  --   GLUCOSE 106* 105*  BUN 40* 38*  CREATININE 1.40* 1.50*  CALCIUM 9.1  --    Lipid Panel:    Component Value Date/Time   CHOL 182 02/17/2015 0535   TRIG 61 02/17/2015 0535   HDL 50 02/17/2015 0535   CHOLHDL 3.6 02/17/2015 0535   VLDL 12 02/17/2015 0535   LDLCALC 120* 02/17/2015 0535   HgbA1c:  Lab Results  Component Value Date   HGBA1C 6.4* 12/16/2014   Urine Drug Screen: No results found for: LABOPIA, COCAINSCRNUR, LABBENZ, AMPHETMU, THCU, LABBARB    IMAGING  Dg Chest 2 View 02/17/2015    No active cardiopulmonary disease.     Ct Head Wo Contrast 02/16/2015   No acute intracranial finding. Critical   Mr Brain Wo Contrast 02/17/2015   1. Subcentimeter acute embolic infarct in the posterior right frontal lobe. 2. Mild chronic small vessel ischemic disease.   Mr Jodene Nam Head/brain Wo Cm 02/17/2015    Unremarkable head MRA.    Carotid Doppler  There is 1-39% bilateral ICA stenosis. Vertebral artery flow is antegrade.    2D echo - - Left ventricle: The  cavity size was normal. Systolic function was normal. The estimated ejection fraction was in the range of 50% to 55%. Wall motion was normal; there were no regional wall motion abnormalities. There was a reduced contribution of atrial contraction to ventricular filling, due to increased ventricular diastolic pressure or atrial contractile dysfunction. Doppler parameters are consistent with a reversible restrictive pattern, indicative of decreased left ventricular diastolic compliance and/or increased left atrial pressure (grade 3 diastolic dysfunction). - Aortic valve: Moderately calcified annulus. Trileaflet. Thickening. Mild focal thickening and calcification. - Mitral valve: There was trivial regurgitation. - Tricuspid valve: There was mild regurgitation. - Pulmonary arteries: PA peak pressure: 32 mm Hg (S).   PHYSICAL EXAM  Temp:  [97.7 F (36.5 C)-98.4 F (36.9 C)] 97.8 F (36.6 C) (08/30 2154) Pulse Rate:  [54-78] 62 (08/30 2154) Resp:  [16-20] 20 (08/30 2154) BP: (114-138)/(59-95) 125/63 mmHg (08/30 2154) SpO2:  [95 %-100 %] 98 % (08/30 2154)  General - Well nourished, well developed, in no apparent distress.  Ophthalmologic - Fundi not visualized due to eye movment.  Cardiovascular - Regular rate and rhythm, not in afib rhythm.  Mental Status -  Level of arousal and orientation to place, and person were  intact, but not to month or year. Language including expression, naming, repetition, comprehension was assessed and found intact. Fund of Knowledge was assessed and was intact impaired.  Cranial Nerves II - XII - II - Visual field intact OU. III, IV, VI - Extraocular movements intact. V - Facial sensation intact bilaterally. VII - Facial movement intact bilaterally. VIII - Hearing & vestibular intact bilaterally, no nystagmus. X - Palate elevates symmetrically. XI - Chin turning & shoulder shrug intact bilaterally. XII - Tongue protrusion  intact.  Motor Strength - The patient's strength was normal in all extremities and pronator drift was absent.  Bulk was normal and fasciculations were absent.   Motor Tone - Muscle tone was assessed at the neck and appendages and was normal.  Reflexes - The patient's reflexes were 1+ in all extremities and she had no pathological reflexes.  Sensory - Light touch, temperature/pinprick were assessed and were symmetrical.    Coordination - The patient had normal movements in the hands and feet with no ataxia or dysmetria.  Tremor was absent.  Gait and Station - deferred due to safety concerns   ASSESSMENT/PLAN [name] is a 74 y.o. female with history of hypertension, diabetes, fibromyalgia, large cell lymphona presenting with dizziness with left-sided weakness and numbness. She did not receive IV t-PA due to resolving deficits w/ minimal residual.   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 Discontinue aspirin.  Patient counseled to be compliant with her antithrombotic medications  Ongoing aggressive stroke risk factor management  Therapy recommendations:  pending   Disposition:  pending   Hypertension  Stable   gradually normalize in 5-7 days  Hyperlipidemia  Home meds:  Omega 3  Has been on simvastatin in the past, unable to tolerate due to leg pain  LDL 120, goal < 70  Recommend trying another statin for tolerance  Continue statin at discharge  Diabetes  HgbA1c 6.4, goal <  7.0  Controlled  Other Stroke Risk Factors  Advanced age  Obesity, Body mass index is 36.19 kg/(m^2).   Family hx stroke  Other Active Problems  Elevated creatinine 1.5  Hypokalemia 3.4  Hospital day # 1  Neurology will sign off. Please call with questions. Pt will follow up with Dr. Erlinda Olson at [facility] in about 2 months. Thanks for the consult.  Rosalin Hawking, MD PhD Stroke Neurology 02/17/2015 10:16 PM   To contact Stroke Continuity provider, please refer to [email or web address] After hours, contact General Neurology

## 2015-02-17 NOTE — Progress Notes (Signed)
Occupational Therapy Evaluation Patient Details Name: [name] MRN: [id number] DOB: [birth year] Today's Date: 02/17/2015    History of Present Illness Pt 74 year old female history of diabetes, hypertension, large cell lymphoma,  Atrial flutter presented to the ER with slurred speech and left-sided weakness. MRI brain: subcentimeter acute embolic infarct in post. Right frontal lobe   Clinical Impression   Pt admitted with the above diagnoses and presents with below problem list. Pt will benefit from continued acute OT to address the below listed deficits and maximize independence with BADLs prior to d/c home with family. PTA pt was independent with ADLs. Pt is currently supervision to min guard with ADLs. OT to continue to follow acutely.     Follow Up Recommendations  Supervision/Assistance - 24 hour;No OT follow up    Equipment Recommendations  None recommended by OT    Recommendations for Other Services       Precautions / Restrictions Restrictions Weight Bearing Restrictions: No Other Position/Activity Restrictions: carpal tunnel syndrome BUE, was planning to have surgical repair      Mobility Bed Mobility               General bed mobility comments: in recliner  Transfers Overall transfer level: Needs assistance Equipment used: None Transfers: Sit to/from Stand Sit to Stand: Supervision         General transfer comment: from recliner, regular height toilet; grab bars used for toilet transfer    Balance Overall balance assessment: Needs assistance Sitting-balance support: No upper extremity supported;Feet supported Sitting balance-Leahy Scale: Good     Standing balance support: No upper extremity supported;During functional activity Standing balance-Leahy Scale: Fair Standing balance comment: stood to complete oral care with external support from sink                            ADL Overall ADL's : Needs  assistance/impaired Eating/Feeding: Set up;Sitting   Grooming: Oral care;Wash/dry hands;Standing;Min guard   Upper Body Bathing: Set up;Sitting   Lower Body Bathing: Min guard;Sit to/from stand   Upper Body Dressing : Set up;Sitting   Lower Body Dressing: Min guard;Sit to/from stand   Toilet Transfer: Min guard;Ambulation;Regular Toilet;Grab bars   Toileting- Clothing Manipulation and Hygiene: Min guard;Sit to/from stand   Tub/ Shower Transfer: Walk-in shower;Min guard;Ambulation;3 in 1   Functional mobility during ADLs: Min guard General ADL Comments: Pt ambulated in room, completed grooming and toilet transfers as detailed above. Educated on using shower seat at home and having someone nearby for showering.     Vision Vision Assessment?: No apparent visual deficits   Perception     Praxis      Pertinent Vitals/Pain Pain Assessment: No/denies pain     Hand Dominance Right   Extremity/Trunk Assessment Upper Extremity Assessment Upper Extremity Assessment: Overall WFL for tasks assessed   Lower Extremity Assessment Lower Extremity Assessment: Defer to PT evaluation       Communication Communication Communication: No difficulties   Cognition Arousal/Alertness: Awake/alert Behavior During Therapy: WFL for tasks assessed/performed Overall Cognitive Status: Within Functional Limits for tasks assessed                     General Comments       Exercises       Shoulder Instructions      Home Living Family/patient expects to be discharged to:: Private residence Living Arrangements: Spouse/significant other Available Help at Discharge: Family Type of Home: House  Home Access: Stairs to enter Entrance Stairs-Number of Steps: 1   Home Layout: One level     Bathroom Shower/Tub: Occupational psychologist: Handicapped height     Home Equipment: Shower seat - built in;Toilet riser;Grab bars - tub/shower          Prior  Functioning/Environment Level of Independence: Independent             OT Diagnosis: Other (comment) (decreased dynamic balance)   OT Problem List: Impaired balance (sitting and/or standing);Decreased knowledge of use of DME or AE;Decreased knowledge of precautions   OT Treatment/Interventions: Self-care/ADL training;DME and/or AE instruction;Therapeutic activities;Patient/family education;Balance training    OT Goals(Current goals can be found in the care plan section) Acute Rehab OT Goals Patient Stated Goal: not stated OT Goal Formulation: With patient Time For Goal Achievement: 02/24/15 Potential to Achieve Goals: Good ADL Goals Pt Will Perform Grooming: Independently;standing Pt Will Transfer to Toilet: Independently;ambulating;regular height toilet;grab bars Pt Will Perform Toileting - Clothing Manipulation and hygiene: Independently;sit to/from stand;sitting/lateral leans Pt Will Perform Tub/Shower Transfer: Shower transfer;with modified independence;shower seat;grab bars  OT Frequency: Min 2X/week   Barriers to D/C:            Co-evaluation              End of Session    Activity Tolerance: Patient tolerated treatment well Patient left: in chair;with call bell/phone within reach;with family/visitor present   Time: 2549-8264 OT Time Calculation (min): 17 min Charges:  OT General Charges $OT Visit: 1 Procedure OT Evaluation $Initial OT Evaluation Tier I: 1 Procedure G-Codes:    Hortencia Pilar 02-18-15, 2:39 PM

## 2015-02-17 NOTE — Evaluation (Signed)
Physical Therapy Evaluation Patient Details Name: [name] MRN: [id number] DOB: 09/19/1940 Today's Date: 02/17/2015   History of Present Illness  Pt 74 year old female history of diabetes, hypertension, large cell lymphoma,  Atrial flutter presented to the ER with slurred speech and left-sided weakness. MRI brain: subcentimeter acute embolic infarct in post. Right frontal lobe  Clinical Impression  Pt functioning near baseline. Pt with mild balance impairment as indicated by score of 16 on DGI. Pt with good home set up and support. Pt to benefit from Acute PT to address higher level balance.    Follow Up Recommendations No PT follow up;Supervision - Intermittent    Equipment Recommendations  None recommended by PT    Recommendations for Other Services       Precautions / Restrictions Precautions Precautions: Fall Restrictions Weight Bearing Restrictions: No Other Position/Activity Restrictions: carpal tunnel syndrome BUE, was planning to have surgical repair      Mobility  Bed Mobility Overal bed mobility: Modified Independent             General bed mobility comments: used bed rail  Transfers Overall transfer level: Needs assistance Equipment used: None Transfers: Sit to/from Stand Sit to Stand: Supervision         General transfer comment: v/cs to push up from bed  Ambulation/Gait Ambulation/Gait assistance: Min guard Ambulation Distance (Feet): 200 Feet Assistive device: None Gait Pattern/deviations: Step-through pattern;Decreased stride length Gait velocity: wfl   General Gait Details: pt with 2 episodes of staggering but able to maintain balance. pt reports she occasionally becomes unsteady due to her bilat LE neuropathy.  Stairs Stairs: Yes Stairs assistance: Min guard Stair Management: Two rails Number of Stairs: 4    Wheelchair Mobility    Modified Rankin (Stroke Patients Only) Modified Rankin (Stroke Patients Only) Pre-Morbid  Rankin Score: No symptoms Modified Rankin: Slight disability     Balance Overall balance assessment: Needs assistance Sitting-balance support: No upper extremity supported;Feet supported Sitting balance-Leahy Scale: Good     Standing balance support: No upper extremity supported;During functional activity Standing balance-Leahy Scale: Fair Standing balance comment: stood to complete oral care with external support from sink                 Standardized Balance Assessment Standardized Balance Assessment : Dynamic Gait Index   Dynamic Gait Index Level Surface: Mild Impairment Change in Gait Speed: Mild Impairment Gait with Horizontal Head Turns: Mild Impairment Gait with Vertical Head Turns: Mild Impairment Gait and Pivot Turn: Mild Impairment Step Over Obstacle: Mild Impairment Step Around Obstacles: Mild Impairment Steps: Mild Impairment Total Score: 16       Pertinent Vitals/Pain Pain Assessment: No/denies pain    Home Living Family/patient expects to be discharged to:: Private residence Living Arrangements: Spouse/significant other Available Help at Discharge: Family Type of Home: House Home Access: Stairs to enter   Technical brewer of Steps: 1 Home Layout: One level Home Equipment: Shower seat - built in;Toilet riser;Grab bars - tub/shower      Prior Function Level of Independence: Independent               Hand Dominance   Dominant Hand: Right    Extremity/Trunk Assessment   Upper Extremity Assessment: Overall WFL for tasks assessed           Lower Extremity Assessment: Overall WFL for tasks assessed      Cervical / Trunk Assessment: Normal  Communication   Communication: No difficulties  Cognition Arousal/Alertness: Awake/alert Behavior During  Therapy: WFL for tasks assessed/performed Overall Cognitive Status: Within Functional Limits for tasks assessed                      General Comments General comments  (skin integrity, edema, etc.): pt assisted to bathroom. pt able to perform hygiene with supervision    Exercises        Assessment/Plan    PT Assessment Patient needs continued PT services  PT Diagnosis Difficulty walking   PT Problem List Decreased strength;Decreased range of motion;Decreased activity tolerance;Decreased balance  PT Treatment Interventions Gait training;Stair training;Functional mobility training;Therapeutic activities;Therapeutic exercise;Balance training;Neuromuscular re-education   PT Goals (Current goals can be found in the Care Plan section) Acute Rehab PT Goals Patient Stated Goal: not stated PT Goal Formulation: With patient Time For Goal Achievement: 02/24/15 Potential to Achieve Goals: Good Additional Goals Additional Goal #1: Pt to score >19 on DGI to indicate minimal falls risk.    Frequency Min 3X/week   Barriers to discharge        Co-evaluation               End of Session Equipment Utilized During Treatment: Gait belt Activity Tolerance: Patient tolerated treatment well Patient left: in chair;with call bell/phone within reach;with family/visitor present Nurse Communication: Mobility status         Time: 1335-1356 PT Time Calculation (min) (ACUTE ONLY): 21 min   Charges:   PT Evaluation $Initial PT Evaluation Tier I: 1 Procedure     PT G CodesKingsley Callander 02/17/2015, 2:48 PM   Kittie Plater, PT, DPT Pager #: [phone] Office #: [phone]

## 2015-02-17 NOTE — Progress Notes (Signed)
*  PRELIMINARY RESULTS* Vascular Ultrasound Carotid Duplex (Doppler) has been completed.  Preliminary findings: Bilateral:  1-39% ICA stenosis.  Vertebral artery flow is antegrade.      Landry Mellow, RDMS, RVT  02/17/2015, 11:11 AM

## 2015-02-17 NOTE — Progress Notes (Signed)
[facility]                                                                              Patient Demographics  [name], is a 74 y.o. female, DOB - April 04, 1941, ATF:[id number]  Admit date - 02/16/2015   Admitting Physician Toy Baker, MD  Outpatient Primary MD for the patient is [name], MD  LOS - 1   Chief Complaint  Patient presents with  . Aphasia      Interim history  74 year old female history of diabetes, hypertension, large cell lymphoma,  Atrial flutter presenting to the emergency room with slurred speech and left-sided weakness. Neurology was consulted.  Patient was found to have atrial flutter, neurology recommended starting Xarelto. Pending workup.  Assessment & Plan   Acute CVA -Slurred speech and Left sided weakness resolved -CT head:  No acute intracranial findings -MRI brain: subcentimeter acute embolic infarct in post. Right frontal lobe -Echocardiogram: pending -Carotid doppler: pending -LDL 120 -hemoglobin A1c  Pending (In June 2016 was 6.4) -PT/OT consulted -Neurology consulted and appreciated -patient was placed on Xarelto overnight as well as aspirin, will discontinue both -Will start Eliquis given AKI  Atrial flutter/fib -Seen on telemetry strips -EKG shows SR -Will obtain repeat EKG today -Will obtain echocardiogram and TSH -CHADSVASC 6 (age, gender, h/o TIA, HTN, DM) -Will start patient on Eliquis  Acute renal failure -baseline creatinine appears to be less than 1 -currently 1.5 -Pending Renal US, and urine electrolytes -ARB and metformin held  Hypokalemia -will replace and continue to monitor BMP  Diabetes mellitus type 2 with neuropathy -hemoglobin A1c pending -metformin held -Continue gabapentin  Hypertension -atenolol and Benicar held -allow for permissive hypertension -patient was started on low-dose metoprolol, but will discontinue for now given bradycardia  Bradycardia -Will hold BB and continue to  monitor tele  History of lymphoma -Continue to follow up with oncology  Code Status: Full  Family Communication: Husband and daugther at bedside  Disposition Plan: Admitted, pending workup  Time Spent in minutes   30 minutes  Procedures  None  Consults   Neurology  DVT Prophylaxis  Eliquis  Lab Results  Component Value Date   PLT 237 02/16/2015    Medications  Scheduled Meds: . aspirin EC  81 mg Oral Daily  . gabapentin  300 mg Oral TID  . insulin aspart  0-9 Units Subcutaneous 6 times per day  . [START ON 02/18/2015] metoprolol tartrate  12.5 mg Oral BID  . rivaroxaban  15 mg Oral Q supper   Continuous Infusions: . sodium chloride 75 mL/hr at 02/17/15 0135   PRN Meds:.acetaminophen **OR** acetaminophen, ALPRAZolam, senna-docusate, traMADol  Antibiotics    Anti-infectives    None      Subjective:   [name] seen and examined today.  Patient states she is feeling better.  She denies further weakness.  Denies chest pain, shortness of breath, abdominal pain, headache, dizziness.    Objective:   Filed Vitals:   02/17/15 0300 02/17/15 0500 02/17/15 0700 02/17/15 1014  BP: 124/69 133/65 137/69 125/59  Pulse: 61 54 54 57  Temp: 98.4 F (36.9 C) 98.1 F (36.7 C) 97.9 F (36.6 C) 97.7 F (  36.5 C)  TempSrc: Oral Oral Oral Oral  Resp: 16 16 18 18   Height:      Weight:      SpO2: 98% 97% 97% 95%    Wt Readings from Last 3 Encounters:  02/16/15 81.33 kg (179 lb 4.8 oz)  12/16/14 80.287 kg (177 lb)  09/26/11 74.844 kg (165 lb)    No intake or output data in the 24 hours ending 02/17/15 1036  Exam  General: Well developed, well nourished, NAD, appears stated age  30: NCAT, mucous membranes moist.   Cardiovascular: S1 S2 auscultated, no rubs, murmurs or gallops. bradycardic  Respiratory: Clear to auscultation   Abdomen: Soft, nontender, nondistended, + bowel sounds  Extremities: warm dry without cyanosis clubbing or edema  Neuro:  AAOx3, cranial nerves grossly intact. Strength 5/5 in patient's upper and lower extremities bilaterally  Psych: Normal affect and demeanor with intact judgement and insight  Data Review   Micro Results Recent Results (from the past 240 hour(s))  Urine culture     Status: None (Preliminary result)   Collection Time: 02/17/15  4:20 AM  Result Value Ref Range Status   Specimen Description URINE, CLEAN CATCH  Final   Special Requests NONE  Final   Culture PENDING  Incomplete   Report Status PENDING  Incomplete    Radiology Reports Dg Chest 2 View  02/17/2015   CLINICAL DATA:  Atrial fibrillation this morning.  EXAM: CHEST  2 VIEW  COMPARISON:  11/20/2009  FINDINGS: Lungs are adequately inflated and otherwise clear. Cardiomediastinal silhouette is within normal. There are mild degenerative changes of the spine.  IMPRESSION: No active cardiopulmonary disease.   Electronically Signed   By: Marin Olp M.D.   On: 02/17/2015 09:29   Ct Head Wo Contrast  02/16/2015   CLINICAL DATA:  Slurred speech, left-sided weakness, code stroke  EXAM: CT HEAD WITHOUT CONTRAST  TECHNIQUE: Contiguous axial images were obtained from the base of the skull through the vertex without intravenous contrast.  COMPARISON:  None.  FINDINGS: No acute hemorrhage, infarct, or mass lesion is identified. No midline shift. Ventricles are normal in size. Orbits and paranasal sinuses are unremarkable. No skull fracture.  IMPRESSION: No acute intracranial finding. Critical Value/emergent results were called by telephone at the time of interpretation on 02/16/2015 at 7:54 pm to Dr. Quintella Reichert, who verbally acknowledged these results.   Electronically Signed   By: Conchita Paris M.D.   On: 02/16/2015 19:56    CBC  Recent Labs Lab 02/16/15 1958 02/16/15 2012  WBC 8.1  --   HGB 13.0 13.6  HCT 39.3 40.0  PLT 237  --   MCV 92.9  --   MCH 30.7  --   MCHC 33.1  --   RDW 14.0  --   LYMPHSABS 2.7  --   MONOABS 0.7  --     EOSABS 0.3  --   BASOSABS 0.0  --     Chemistries   Recent Labs Lab 02/16/15 1958 02/16/15 2012  NA 136 138  K 3.4* 3.4*  CL 102 101  CO2 24  --   GLUCOSE 106* 105*  BUN 40* 38*  CREATININE 1.40* 1.50*  CALCIUM 9.1  --   AST 27  --   ALT 31  --   ALKPHOS 93  --   BILITOT 0.9  --    ------------------------------------------------------------------------------------------------------------------ estimated creatinine clearance is 30.8 mL/min (by C-G formula based on Cr of 1.5). ------------------------------------------------------------------------------------------------------------------ No results for input(s):  HGBA1C in the last 72 hours. ------------------------------------------------------------------------------------------------------------------  Recent Labs  02/17/15 0535  CHOL 182  HDL 50  LDLCALC 120*  TRIG 61  CHOLHDL 3.6   ------------------------------------------------------------------------------------------------------------------ No results for input(s): TSH, T4TOTAL, T3FREE, THYROIDAB in the last 72 hours.  Invalid input(s): FREET3 ------------------------------------------------------------------------------------------------------------------ No results for input(s): VITAMINB12, FOLATE, FERRITIN, TIBC, IRON, RETICCTPCT in the last 72 hours.  Coagulation profile  Recent Labs Lab 02/16/15 1958  INR 1.06    No results for input(s): DDIMER in the last 72 hours.  Cardiac Enzymes  Recent Labs Lab 02/17/15 0050 02/17/15 0535  TROPONINI <0.03 <0.03   ------------------------------------------------------------------------------------------------------------------ Invalid input(s): POCBNP    [name] D.O. on 02/17/2015 at 10:36 AM  Between 7am to 7pm - Pager - [phone]  After 7pm go to [email or web address] - password [email or web address]  And look for the night coverage person covering for me after hours  [facility] Office   [phone]

## 2015-02-17 NOTE — Care Management Note (Signed)
Case Management Note  Patient Details  Name: CHIFFON KITTLESON MRN: [id number] Date of Birth: 1940-09-10  Subjective/Objective:                    Action/Plan: Patient was admitted with CVA. Lives at home with spouse.  Patient will be discharging home on Eliquis.  CM attempted to run a benefits check to determine whether medication is affordable prior to discharge.  No prescription drug coverage is on file at this time.  Patient will have husband bring prescription drug card and CM will reattempt benefits check.  Expected Discharge Date:                  Expected Discharge Plan:  [place]  In-House Referral:     Discharge planning Services     Post Acute Care Choice:    Choice offered to:     DME Arranged:    DME Agency:     HH Arranged:    [place] Agency:     Status of Service:  In process, will continue to follow  Medicare Important Message Given:    Date Medicare IM Given:    Medicare IM give by:    Date Additional Medicare IM Given:    Additional Medicare Important Message give by:     If discussed at [place] of Stay Meetings, dates discussed:    Additional Comments:  Rolm Baptise, RN 02/17/2015, 2:29 PM

## 2015-02-18 DIAGNOSIS — E119 Type 2 diabetes mellitus without complications: Secondary | ICD-10-CM

## 2015-02-18 DIAGNOSIS — I63411 Cerebral infarction due to embolism of right middle cerebral artery: Principal | ICD-10-CM

## 2015-02-18 DIAGNOSIS — I4892 Unspecified atrial flutter: Secondary | ICD-10-CM

## 2015-02-18 DIAGNOSIS — N179 Acute kidney failure, unspecified: Secondary | ICD-10-CM

## 2015-02-18 LAB — URINE CULTURE: Culture: 50000

## 2015-02-18 LAB — BASIC METABOLIC PANEL
Anion gap: 8 (ref 5–15)
BUN: 25 mg/dL — AB (ref 6–20)
CALCIUM: 8.3 mg/dL — AB (ref 8.9–10.3)
CO2: 25 mmol/L (ref 22–32)
CREATININE: 1.01 mg/dL — AB (ref 0.44–1.00)
Chloride: 108 mmol/L (ref 101–111)
GFR calc Af Amer: >60 mL/min (ref >60)
GFR, EST NON AFRICAN AMERICAN: 54 mL/min — AB (ref >60)
GLUCOSE: 110 mg/dL — AB (ref 65–99)
POTASSIUM: 4 mmol/L (ref 3.5–5.1)
SODIUM: 141 mmol/L (ref 135–145)

## 2015-02-18 LAB — GLUCOSE, CAPILLARY
GLUCOSE-CAPILLARY: 118 mg/dL — AB (ref 65–99)
GLUCOSE-CAPILLARY: 113 mg/dL — AB (ref 65–99)
GLUCOSE-CAPILLARY: 117 mg/dL — AB (ref 65–99)
GLUCOSE-CAPILLARY: 87 mg/dL (ref 65–99)

## 2015-02-18 LAB — HEMOGLOBIN A1C
HEMOGLOBIN A1C: 6.2 % — AB (ref 4.8–5.6)
Mean Plasma Glucose: 131 mg/dL

## 2015-02-18 LAB — CBC
HCT: 38 % (ref 36.0–46.0)
Hemoglobin: 12.1 g/dL (ref 12.0–15.0)
MCH: 30 pg (ref 26.0–34.0)
MCHC: 31.8 g/dL (ref 30.0–36.0)
MCV: 94.1 fL (ref 78.0–100.0)
PLATELETS: 212 10*3/uL (ref 150–400)
RBC: 4.04 MIL/uL (ref 3.87–5.11)
RDW: 14.1 % (ref 11.5–15.5)
WBC: 5.6 10*3/uL (ref 4.0–10.5)

## 2015-02-18 MED ORDER — ISOSORBIDE MONONITRATE ER 30 MG PO TB24: 30.0000 mg | ORAL_TABLET | Freq: Every day | ORAL | Status: DC

## 2015-02-18 MED ORDER — FUROSEMIDE 20 MG PO TABS: 20.0000 mg | ORAL_TABLET | ORAL | Status: DC | PRN

## 2015-02-18 MED ORDER — PRAVASTATIN SODIUM 20 MG PO TABS: 20.0000 mg | ORAL_TABLET | Freq: Every day | ORAL | Status: DC

## 2015-02-18 MED ORDER — APIXABAN 5 MG PO TABS: 5.0000 mg | ORAL_TABLET | Freq: Two times a day (BID) | ORAL | Status: AC

## 2015-02-18 MED ORDER — EZETIMIBE 10 MG PO TABS: 10.0000 mg | ORAL_TABLET | Freq: Every day | ORAL | Status: DC

## 2015-02-18 MED ORDER — OLMESARTAN MEDOXOMIL 40 MG PO TABS: 40.0000 mg | ORAL_TABLET | Freq: Every day | ORAL | Status: DC

## 2015-02-18 NOTE — Progress Notes (Signed)
Physical Therapy Treatment Patient Details Name: [name] MRN: [id number] DOB: 02/06/1941 Today's Date: 02/18/2015    History of Present Illness Pt 74 year old female history of diabetes, hypertension, large cell lymphoma,  Atrial flutter presented to the ER with slurred speech and left-sided weakness. MRI brain: subcentimeter acute embolic infarct in post. Right frontal lobe    PT Comments    Patient back to baseline for mobility. No physical assist required. Patient ambulating and performing stair negotiation without need for assist. Patient and family educated regarding BEFAST signs of stroke. At this time, no further acute PT needs. Will sign off.  Follow Up Recommendations  No PT follow up;Supervision - Intermittent     Equipment Recommendations  None recommended by PT    Recommendations for Other Services       Precautions / Restrictions Precautions Precautions: Fall Restrictions Weight Bearing Restrictions: No Other Position/Activity Restrictions: carpal tunnel syndrome BUE, was planning to have surgical repair    Mobility  Bed Mobility Overal bed mobility: Modified Independent             General bed mobility comments: used bed rail  Transfers Overall transfer level: Independent Equipment used: None                Ambulation/Gait Ambulation/Gait assistance: Independent Ambulation Distance (Feet): 180 Feet Assistive device: None Gait Pattern/deviations: Step-through pattern;Decreased stride length Gait velocity: wfl   General Gait Details: improved stability noted this session. No LOB, able to perform functional tasks and higher level activities without LOB or difficulty   Stairs Stairs: Yes Stairs assistance: Modified independent (Device/Increase time) Stair Management: One rail Right Number of Stairs: 5 General stair comments: no difficulty with steps today  Wheelchair Mobility    Modified Rankin (Stroke Patients  Only) Modified Rankin (Stroke Patients Only) Pre-Morbid Rankin Score: No symptoms Modified Rankin: Slight disability     Balance Overall balance assessment: Needs assistance Sitting-balance support: No upper extremity supported Sitting balance-Leahy Scale: Good       Standing balance-Leahy Scale: Fair                      Cognition Arousal/Alertness: Awake/alert Behavior During Therapy: WFL for tasks assessed/performed Overall Cognitive Status: Within Functional Limits for tasks assessed                      Exercises      General Comments General comments (skin integrity, edema, etc.): educated patient and family regarding BEFAST signs of stroke      Pertinent Vitals/Pain Pain Assessment: No/denies pain    Home Living                      Prior Function            PT Goals (current goals can now be found in the care plan section) Acute Rehab PT Goals Patient Stated Goal: not stated PT Goal Formulation: With patient Time For Goal Achievement: 02/24/15 Potential to Achieve Goals: Good Progress towards PT goals: Goals met/education completed, patient discharged from PT    Frequency  Min 3X/week    PT Plan Current plan remains appropriate    Co-evaluation             End of Session Equipment Utilized During Treatment: Gait belt Activity Tolerance: Patient tolerated treatment well Patient left: in bed;with call bell/phone within reach;with family/visitor present     Time: 0910-0927 PT Time Calculation (min) (ACUTE ONLY):  17 min  Charges:  $Self Care/Home Management: 8-22                    G CodesDuncan Dull 02/19/15, 12:59 PM Alben Deeds, [place] DPT  [phone]

## 2015-02-18 NOTE — Evaluation (Signed)
SLP Cancellation Note  Patient Details Name: [name] MRN: [id number] DOB: 03-26-1941   Cancelled treatment:       Reason Eval/Treat Not Completed: SLP screened, no needs identified, will sign off   Luanna Salk, [facility] SLP [phone]

## 2015-02-18 NOTE — Discharge Instructions (Signed)

## 2015-02-18 NOTE — Discharge Summary (Signed)
Discharge Summary  [name] OEV:[id number] DOB: 12/13/1940  PCP: [name], MD  Admit date: 02/16/2015 Discharge date: 02/18/2015  Time spent: >68mins  Recommendations for Outpatient Follow-up:  1. F/u with PMD within a week, repeat bmp at follow up to monitor renal function 2. F/u with cardiology for new diagnose of afib, patient also has bradycardia in the low 50's in the hospital, consider outpatient holter monitor to r/o tachy/bardy syndrome, to be determined by cardiology. Explained this to Patient's daughter who is cardiac nurse who expressed understanding  Discharge Diagnoses:  Active Hospital Problems   Diagnosis Date Noted  . Cerebral infarction due to embolism of right middle cerebral artery   . HLD (hyperlipidemia)   . Slurred speech 02/16/2015  . TIA (transient ischemic attack) 02/16/2015  . Diabetes 02/16/2015  . Atrial flutter 02/16/2015  . Acute renal failure 02/16/2015    Resolved Hospital Problems   Diagnosis Date Noted Date Resolved  No resolved problems to display.    Discharge Condition: stable  Diet recommendation: heart healthy/carb modified  Filed Weights   02/16/15 1939 02/16/15 2200  Weight: 174 lb (78.926 kg) 179 lb 4.8 oz (81.33 kg)    History of present illness:  [name] is a 74 y.o. female   has a past medical history of Hypertension; Diabetes mellitus; Fibromyalgia; Urinary tract infection (09/19/11); Cancer; and Lymphoma malignant, large cell.   Presented with sudden onset of slurred speech and left-sided weakness when doing dishes around 7 PM soon after onset patient presented to emergency department. Altogether symptoms lasted 1 hour. NIH scale was 1 on arrival. She was seen in the emergency department by Dr. [name] neurology who recommended admission for TIA workup. No indication for tPA. Patient's risk factors include history of hypertension and diabetes.  Patient has no prior history of strokes or coronary artery  disease. She is on daily aspirin 81 mg.   On arrival to the floor patient was found to be in A. Flutter. Discussed case with neurology who recommended starting Xarelto.  Patient has known hx of Neuropathy followed by Dr. [name] after receiving chemotherapy for her Lymphoma 8 years ago. Patient has ongoing history of numbness tingling and burning of the hands and feet.  On 17th of July patient undergone nerve conduction studies showing evidence of bilateral severe carpal tunnel syndrome without evidence of radiculopathy.  Patient has history of diabetes controlled on metformin.  Interval thousand 11 she was diagnosed with large cell lymphoma and was treated with chemotherapy by Dr. [name].    Hospitalist was called for admission for TIA-like symptoms  Hospital Course:  Active Problems:   Slurred speech   TIA (transient ischemic attack)   Diabetes   Atrial flutter   Acute renal failure   Cerebral infarction due to embolism of right middle cerebral artery   HLD (hyperlipidemia)  Acute CVA -Slurred speech and Left sided weakness resolved -CT head: No acute intracranial findings -MRI brain: subcentimeter acute embolic infarct in post. Right frontal lobe -Echocardiogram: diastolic dysfunction, otherwise unremarkable -Carotid doppler: Findings consistent with 1- 39 percent stenosis involving the right internal carotid artery and the left internal carotid artery. -LDL 120, reported statin intolerance in the past, agree with pravachol and zetia at discharge. -hemoglobin A1c 6.4 (In June 2016 was 6.4) -PT/OT consulted, patient neurologic symptom has resolved, cleared by pt/ot. -Neurology consulted and appreciated -patient was placed on Xarelto overnight as well as aspirin, neurology recommended eliquis, d/c asa. -neurology signed off, patient is to follow  up with neurology outpatient.  Atrial flutter/fib, paroxysmal  -Seen on telemetry strips -EKG shows SR -echocardiogram  normal wall motion, lvef wnl, grade III diastolic dysfunction,  TSH wnl; -CHADSVASC 6 (age, gender, h/o TIA, HTN, DM) -start patient on Eliquis -outpatient cardiology follow up  Acute renal failure -baseline creatinine appears to be less than 1, 1.5 on admission,  -Renal US medical renal disease, ua no infection , no blood. -ARB and metformin held, received hydration, cr back to baseline -arb/metfromin restarted at discharge, hctz stopped, changed to lasix prn for edema, pmd to repeat bmp at follow up.  Hypokalemia -replaced  Diabetes mellitus type 2 with neuropathy -hemoglobin A1c 6.2 -metformin held in the hospital, resumed at discharge, continue monitor renal function and blood sugar by pmd. -Continue gabapentin  Hypertension -atenolol and Benicar held -allow for permissive hypertension -patient was started on low-dose metoprolol, but will discontinue for now given bradycardia -benicar restarted at discharge, added imdur for blood pressure control. Stopped hcta and atenolol. Advised patient to monitor blood pressure at home, bp meds to be further adjusted outpatient.  Bradycardia -d/c atenolol -cardiology outpatient follow up consider holter monitor to r/o tachy-brady syndrome.  History of lymphoma -Continue to follow up with oncology  Code Status: Full  Family Communication: patient and two daugthers at bedside  Disposition Plan: home   Procedures  None  Consults  Neurology   Discharge Exam: BP 146/97 mmHg  Pulse 58  Temp(Src) 98.2 F (36.8 C) (Oral)  Resp 18  Ht 4\' 11"  (1.499 m)  Wt 179 lb 4.8 oz (81.33 kg)  BMI 36.19 kg/m2  SpO2 99%   General: Well developed, well nourished, NAD, appears stated age  HEENT: NCAT, mucous membranes moist.   Cardiovascular: S1 S2 auscultated, no rubs, murmurs or gallops. bradycardic  Respiratory: Clear to auscultation  Abdomen: Soft, nontender, nondistended, + bowel sounds  Extremities: warm dry without  cyanosis clubbing or edema  Neuro: AAOx3, cranial nerves grossly intact. Strength 5/5 in patient's upper and lower extremities bilaterally  Psych: Normal affect and demeanor with intact judgement and insight    Discharge Instructions You were cared for by a hospitalist during your hospital stay. If you have any questions about your discharge medications or the care you received while you were in the hospital after you are discharged, you can call the unit and asked to speak with the hospitalist on call if the hospitalist that took care of you is not available. Once you are discharged, your primary care physician will handle any further medical issues. Please note that NO REFILLS for any discharge medications will be authorized once you are discharged, as it is imperative that you return to your primary care physician (or establish a relationship with a primary care physician if you do not have one) for your aftercare needs so that they can reassess your need for medications and monitor your lab values.  Discharge Instructions    Ambulatory referral to Neurology    Complete by:  As directed   Pt will follow up with Dr. Erlinda Hong at [facility] in about 2 months. Thanks.            Medication List    STOP taking these medications        aspirin EC 81 MG tablet     atenolol 50 MG tablet  Commonly known as:  TENORMIN     BENICAR HCT 40-12.5 MG per tablet  Generic drug:  olmesartan-hydrochlorothiazide      TAKE these medications  apixaban 5 MG Tabs tablet  Commonly known as:  ELIQUIS  Take 1 tablet (5 mg total) by mouth 2 (two) times daily.     BIOTIN 5000 PO  Take 1 tablet by mouth daily.     CENTRUM SILVER PO  Take 1 tablet by mouth daily.     ezetimibe 10 MG tablet  Commonly known as:  ZETIA  Take 1 tablet (10 mg total) by mouth daily.     FISH OIL PO  Take 1,200 mg by mouth daily.     furosemide 20 MG tablet  Commonly known as:  LASIX  Take 1 tablet (20 mg total) by mouth  every other day as needed for fluid or edema.     gabapentin 300 MG capsule  Commonly known as:  NEURONTIN  Take 1 capsule (300 mg total) by mouth 3 (three) times daily.     isosorbide mononitrate 30 MG 24 hr tablet  Commonly known as:  IMDUR  Take 1 tablet (30 mg total) by mouth daily.     metFORMIN 500 MG tablet  Commonly known as:  GLUCOPHAGE  Take 500 mg by mouth daily.     olmesartan 40 MG tablet  Commonly known as:  BENICAR  Take 1 tablet (40 mg total) by mouth daily.     pravastatin 20 MG tablet  Commonly known as:  PRAVACHOL  Take 1 tablet (20 mg total) by mouth daily.     traMADol 50 MG tablet  Commonly known as:  ULTRAM  Take 1 tablet (50 mg total) by mouth every 6 (six) hours as needed.     VITAMIN D PO  Take 1,000 Units by mouth daily.       No Known Allergies     Follow-up Information    Follow up with Zalen Sequeira,Jindong, MD. Schedule an appointment as soon as possible for a visit in 2 months.   Specialty:  Neurology   Why:  stroke clinic   Contact information:   [address] [place] [address] [phone]       Follow up with Nahser, Wonda Cheng, MD In 1 week.   Specialty:  Cardiology   Why:  new diagnosis of afib/cva, has bradycardia in the hospital, consider holter monitoring   Contact information:   [address] [place] [address] [phone]       Follow up with [name], MD In 1 week.   Specialty:  Internal Medicine   Why:  repeat bmp at follow up   Contact information:   [address] [place] [address] [phone]        The results of significant diagnostics from this hospitalization (including imaging, microbiology, ancillary and laboratory) are listed below for reference.    Significant Diagnostic Studies: Dg Chest 2 View  02/17/2015   CLINICAL DATA:  Atrial fibrillation this morning.  EXAM: CHEST  2 VIEW  COMPARISON:  11/20/2009  FINDINGS: Lungs are adequately inflated and  otherwise clear. Cardiomediastinal silhouette is within normal. There are mild degenerative changes of the spine.  IMPRESSION: No active cardiopulmonary disease.   Electronically Signed   By: Marin Olp M.D.   On: 02/17/2015 09:29   Ct Head Wo Contrast  02/16/2015   CLINICAL DATA:  Slurred speech, left-sided weakness, code stroke  EXAM: CT HEAD WITHOUT CONTRAST  TECHNIQUE: Contiguous axial images were obtained from the base of the skull through the vertex without intravenous contrast.  COMPARISON:  None.  FINDINGS: No acute hemorrhage, infarct, or mass lesion is identified. No midline shift. Ventricles are normal in size. Orbits and paranasal sinuses are unremarkable. No skull fracture.  IMPRESSION: No acute intracranial finding. Critical Value/emergent results were called by telephone at the time of interpretation on 02/16/2015 at 7:54 pm to Dr. Quintella Reichert, who verbally acknowledged these results.   Electronically Signed   By: Conchita Paris M.D.   On: 02/16/2015 19:56   Mr Brain Wo Contrast  02/17/2015   CLINICAL DATA:  Sudden onset of slurred speech and left-sided weakness. Symptoms lasted for 1 hour. Atrial fibrillation.  EXAM: MRI HEAD WITHOUT CONTRAST  MRA HEAD WITHOUT CONTRAST  TECHNIQUE: Multiplanar, multiecho pulse sequences of the brain and surrounding structures were obtained without intravenous contrast. Angiographic images of the head were obtained using MRA technique without contrast.  COMPARISON:  Head CT 02/16/2015  FINDINGS: MRI HEAD FINDINGS  There is an approximately 9 mm linear focus of subcortical acute infarction in the posterior right frontal lobe. There is no evidence of intracranial hemorrhage, mass, midline shift, or extra-axial fluid collection. There is mild generalized cerebral atrophy, within normal limits for age. Small foci of T2 hyperintensity in the periventricular white matter are nonspecific but compatible with mild chronic small vessel ischemic disease.  Prior  bilateral cataract extraction is noted. Paranasal sinuses and mastoid air cells are clear. Major intracranial vascular flow voids are preserved. Limited visualization of the upper cervical spine demonstrates grade 1 anterolisthesis of C3 on C4 as well as C4-5 and C5-6 disc degeneration with likely mild impression on the spinal cord at these levels.  MRA HEAD FINDINGS  Study is mildly motion degraded. The visualized distal vertebral arteries are patent and codominant without stenosis. Right PICA origin is patent. AICA and SCA origins are patent. Basilar artery is patent without stenosis. PCAs are unremarkable. Posterior communicating arteries are not clearly identified.  Internal carotid arteries are patent from skullbase to carotid termini without stenosis. Anterior communicating artery is patent. ACAs and MCAs are unremarkable. No intracranial aneurysm is identified.  IMPRESSION: 1. Subcentimeter acute embolic infarct in the posterior right frontal lobe. 2. Mild chronic small vessel ischemic disease. 3. Unremarkable head MRA.   Electronically Signed   By: Logan Bores M.D.   On: 02/17/2015 11:17   US Renal  02/17/2015   CLINICAL DATA:  Acute renal insufficiency. Hypertension and diabetes.  EXAM: RENAL / URINARY TRACT ULTRASOUND COMPLETE  COMPARISON:  CT 05/29/2010 and ultrasound 04/23/2009  FINDINGS: Right Kidney:  Length: 9.2 cm. Echogenicity within normal limits. Mild renal cortical thinning. No mass or hydronephrosis visualized.  Left Kidney:  Length: 12.4 cm. Echogenicity within normal limits. No mass or hydronephrosis visualized.  Bladder:  Appears normal for degree of bladder distention.  IMPRESSION: Right kidney at the lower limits of normal in size with mild renal cortical thinning. No hydronephrosis.   Electronically Signed   By: Marin Olp M.D.   On: 02/17/2015 14:54   Mr Jodene Nam Head/brain Wo Cm  02/17/2015   CLINICAL DATA:  Sudden onset of slurred speech and left-sided weakness. Symptoms lasted for  1 hour. Atrial fibrillation.  EXAM: MRI HEAD WITHOUT CONTRAST  MRA HEAD WITHOUT CONTRAST  TECHNIQUE: Multiplanar, multiecho pulse sequences of the brain and surrounding structures were obtained without intravenous contrast. Angiographic images of the head were obtained using MRA technique without contrast.  COMPARISON:  Head CT 02/16/2015  FINDINGS: MRI HEAD FINDINGS  There is an approximately 9 mm linear focus of subcortical acute  infarction in the posterior right frontal lobe. There is no evidence of intracranial hemorrhage, mass, midline shift, or extra-axial fluid collection. There is mild generalized cerebral atrophy, within normal limits for age. Small foci of T2 hyperintensity in the periventricular white matter are nonspecific but compatible with mild chronic small vessel ischemic disease.  Prior bilateral cataract extraction is noted. Paranasal sinuses and mastoid air cells are clear. Major intracranial vascular flow voids are preserved. Limited visualization of the upper cervical spine demonstrates grade 1 anterolisthesis of C3 on C4 as well as C4-5 and C5-6 disc degeneration with likely mild impression on the spinal cord at these levels.  MRA HEAD FINDINGS  Study is mildly motion degraded. The visualized distal vertebral arteries are patent and codominant without stenosis. Right PICA origin is patent. AICA and SCA origins are patent. Basilar artery is patent without stenosis. PCAs are unremarkable. Posterior communicating arteries are not clearly identified.  Internal carotid arteries are patent from skullbase to carotid termini without stenosis. Anterior communicating artery is patent. ACAs and MCAs are unremarkable. No intracranial aneurysm is identified.  IMPRESSION: 1. Subcentimeter acute embolic infarct in the posterior right frontal lobe. 2. Mild chronic small vessel ischemic disease. 3. Unremarkable head MRA.   Electronically Signed   By: Logan Bores M.D.   On: 02/17/2015 11:17     Microbiology: Recent Results (from the past 240 hour(s))  Urine culture     Status: None   Collection Time: 02/17/15  4:20 AM  Result Value Ref Range Status   Specimen Description URINE, CLEAN CATCH  Final   Special Requests NONE  Final   Culture 50,000 COLONIES/mL LACTOBACILLUS SPECIES  Final   Report Status 02/18/2015 FINAL  Final     Labs: Basic Metabolic Panel:  Recent Labs Lab 02/16/15 1958 02/16/15 2012 02/18/15 0612  NA 136 138 141  K 3.4* 3.4* 4.0  CL 102 101 108  CO2 24  --  25  GLUCOSE 106* 105* 110*  BUN 40* 38* 25*  CREATININE 1.40* 1.50* 1.01*  CALCIUM 9.1  --  8.3*   Liver Function Tests:  Recent Labs Lab 02/16/15 1958  AST 27  ALT 31  ALKPHOS 93  BILITOT 0.9  PROT 7.0  ALBUMIN 4.0   No results for input(s): LIPASE, AMYLASE in the last 168 hours. No results for input(s): AMMONIA in the last 168 hours. CBC:  Recent Labs Lab 02/16/15 1958 02/16/15 2012 02/18/15 0612  WBC 8.1  --  5.6  NEUTROABS 4.4  --   --   HGB 13.0 13.6 12.1  HCT 39.3 40.0 38.0  MCV 92.9  --  94.1  PLT 237  --  212   Cardiac Enzymes:  Recent Labs Lab 02/17/15 0050 02/17/15 0535 02/17/15 1159  TROPONINI <0.03 <0.03 <0.03   BNP: BNP (last 3 results) No results for input(s): BNP in the last 8760 hours.  ProBNP (last 3 results) No results for input(s): PROBNP in the last 8760 hours.  CBG:  Recent Labs Lab 02/17/15 2011 02/18/15 0154 02/18/15 0404 02/18/15 0726 02/18/15 1134  GLUCAP 85 118* 113* 117* 87       Signed:  Neita Landrigan MD, PhD  [facility] 02/18/2015, 3:01 PM

## 2015-02-20 ENCOUNTER — Ambulatory Visit (INDEPENDENT_AMBULATORY_CARE_PROVIDER_SITE_OTHER): Payer: Medicare Other | Admitting: Cardiology

## 2015-02-20 ENCOUNTER — Encounter: Payer: Self-pay | Admitting: Cardiology

## 2015-02-20 VITALS — BP 138/82 | HR 68 | Ht 59.0 in | Wt 182.9 lb

## 2015-02-20 DIAGNOSIS — I4891 Unspecified atrial fibrillation: Secondary | ICD-10-CM | POA: Diagnosis not present

## 2015-02-20 DIAGNOSIS — I480 Paroxysmal atrial fibrillation: Secondary | ICD-10-CM | POA: Diagnosis not present

## 2015-02-20 DIAGNOSIS — E785 Hyperlipidemia, unspecified: Secondary | ICD-10-CM | POA: Diagnosis not present

## 2015-02-20 DIAGNOSIS — I10 Essential (primary) hypertension: Secondary | ICD-10-CM | POA: Diagnosis not present

## 2015-02-20 MED ORDER — METOPROLOL SUCCINATE ER 25 MG PO TB24: 12.5000 mg | ORAL_TABLET | Freq: Every day | ORAL | Status: DC

## 2015-02-20 NOTE — Assessment & Plan Note (Signed)
Patient presented recently with a TIA and paroxysmal atrial fibrillation. TSH normal. Echocardiogram showed preserved LV function. CHADS vasc 6. Continue apixaban. In 4 weeks check hemoglobin and renal function. She was noted to be bradycardic on atenolol 50 mg daily. This was discontinued in the hospital. I will add Toprol 12.5 mg daily to assist with rate control if atrial fibrillation recurs. Note she is asymptomatic with her atrial fibrillation. We will plan rate control and anticoagulation in the future.

## 2015-02-20 NOTE — Assessment & Plan Note (Signed)
Continue statin. 

## 2015-02-20 NOTE — Assessment & Plan Note (Signed)
Blood pressure controlled. Discontinue isosorbide. Add Toprol 12.5 mg daily for paroxysmal atrial fibrillation. Follow blood pressure and adjust as needed. Watch for bradycardia as she had decreased heart rate with atenolol 50 mg daily.

## 2015-02-20 NOTE — Progress Notes (Signed)
HPI: 74 year old female for evaluation of atrial fibrillation. Patient admitted to [facility] in August 2016 with a TIA. Patient noted to be in atrial fib. She was treated with anticoagulation. Echocardiogram August 2016 showed normal LV function, restrictive filling, mild tricuspid regurgitation and trace mitral regurgitation. Carotid Dopplers August 2016 showed 1-39% bilateral stenosis. Since discharge the patient denies chest pain, palpitations, syncope or bleeding. Some dyspnea on exertion but no orthopnea or PND.  Current Outpatient Prescriptions  Medication Sig Dispense Refill  . apixaban (ELIQUIS) 5 MG TABS tablet Take 1 tablet (5 mg total) by mouth 2 (two) times daily. 60 tablet 3  . BIOTIN 5000 PO Take 1 tablet by mouth daily.    . Cholecalciferol (VITAMIN D PO) Take 1,000 Units by mouth daily.    Marland Kitchen ezetimibe (ZETIA) 10 MG tablet Take 1 tablet (10 mg total) by mouth daily. 30 tablet 3  . furosemide (LASIX) 20 MG tablet Take 1 tablet (20 mg total) by mouth every other day as needed for fluid or edema. 30 tablet 0  . gabapentin (NEURONTIN) 300 MG capsule Take 1 capsule (300 mg total) by mouth 3 (three) times daily. 90 capsule 11  . isosorbide mononitrate (IMDUR) 30 MG 24 hr tablet Take 1 tablet (30 mg total) by mouth daily. 30 tablet 0  . metFORMIN (GLUCOPHAGE) 500 MG tablet Take 500 mg by mouth daily.    . Multiple Vitamins-Minerals (CENTRUM SILVER PO) Take 1 tablet by mouth daily.    Marland Kitchen olmesartan (BENICAR) 40 MG tablet Take 1 tablet (40 mg total) by mouth daily. 30 tablet 3  . Omega-3 Fatty Acids (FISH OIL PO) Take 1,200 mg by mouth daily.    . pravastatin (PRAVACHOL) 20 MG tablet Take 1 tablet (20 mg total) by mouth daily. 30 tablet 0  . traMADol (ULTRAM) 50 MG tablet Take 1 tablet (50 mg total) by mouth every 6 (six) hours as needed. (Patient taking differently: Take 50 mg by mouth every 6 (six) hours as needed for moderate pain or severe pain. ) 50 tablet 4   No  current facility-administered medications for this visit.     Past Medical History  Diagnosis Date  . Hypertension   . Diabetes mellitus   . Fibromyalgia   . Urinary tract infection 09/19/11    Being treated at this time Cipro  . Cancer   . Lymphoma malignant, large cell   . Hyperlipidemia     Past Surgical History  Procedure Laterality Date  . Cholecystectomy    . Appendectomy    . Rotator cuff repair    . Joint replacement    . Joint replacement    . Cataract extraction  2015    Social History   Social History  . Marital Status: Married    Spouse Name: [name]   . Number of Children: 6  . Years of Education: 12   Occupational History  . Retired     Social History Main Topics  . Smoking status: Never Smoker   . Smokeless tobacco: Not on file  . Alcohol Use: 0.0 oz/week    0 Standard drinks or equivalent per week     Comment: Rare  . Drug Use: No  . Sexual Activity: Not on file   Other Topics Concern  . Not on file   Social History Narrative   Lives at home with husband, [name].   Caffeine use: 2-3 cups coffee per day    ROS: no fevers  or chills, productive cough, hemoptysis, dysphasia, odynophagia, melena, hematochezia, dysuria, hematuria, rash, seizure activity, orthopnea, PND, pedal edema, claudication. Remaining systems are negative.  Physical Exam: Well-developed obese in no acute distress.  Skin is warm and dry.  HEENT is normal.  Neck is supple.  Chest is clear to auscultation with normal expansion.  Cardiovascular exam is regular rate and rhythm.  Abdominal exam nontender or distended. No masses palpated. Extremities show no edema. neuro grossly intact  Rhythm strips from the hospital reviewed and on August 29 there is documentation of atrial fibrillation.

## 2015-02-20 NOTE — Patient Instructions (Signed)
Your physician wants you to follow-up in: [place] will receive a reminder letter in the mail two months in advance. If you don't receive a letter, please call our office to schedule the follow-up appointment.   STOP ISOSORBIDE  START METOPROLOL SUCC ER 12.5 MG ONCE DAILY=1/2 OF 25 MG TABLET ONCE DAILY  Your physician recommends that you return for lab work in: [place]

## 2015-03-19 LAB — CBC
HEMATOCRIT: 42.3 % (ref 36.0–46.0)
HEMOGLOBIN: 13.9 g/dL (ref 12.0–15.0)
MCH: 30.7 pg (ref 26.0–34.0)
MCHC: 32.9 g/dL (ref 30.0–36.0)
MCV: 93.4 fL (ref 78.0–100.0)
MPV: 10.5 fL (ref 8.6–12.4)
Platelets: 260 10*3/uL (ref 150–400)
RBC: 4.53 MIL/uL (ref 3.87–5.11)
RDW: 14.4 % (ref 11.5–15.5)
WBC: 6.7 10*3/uL (ref 4.0–10.5)

## 2015-03-19 LAB — BASIC METABOLIC PANEL
BUN: 22 mg/dL (ref 7–25)
CALCIUM: 9.1 mg/dL (ref 8.6–10.4)
CO2: 26 mmol/L (ref 20–31)
Chloride: 106 mmol/L (ref 98–110)
Creat: 0.87 mg/dL (ref 0.60–0.93)
GLUCOSE: 114 mg/dL — AB (ref 65–99)
Potassium: 4.7 mmol/L (ref 3.5–5.3)
SODIUM: 140 mmol/L (ref 135–146)

## 2015-04-21 ENCOUNTER — Encounter: Payer: Self-pay | Admitting: Neurology

## 2015-04-21 ENCOUNTER — Ambulatory Visit (INDEPENDENT_AMBULATORY_CARE_PROVIDER_SITE_OTHER): Payer: Medicare Other | Admitting: Neurology

## 2015-04-21 VITALS — BP 148/93 | HR 73 | Ht 59.0 in | Wt 177.4 lb

## 2015-04-21 DIAGNOSIS — Z7901 Long term (current) use of anticoagulants: Secondary | ICD-10-CM | POA: Insufficient documentation

## 2015-04-21 DIAGNOSIS — E785 Hyperlipidemia, unspecified: Secondary | ICD-10-CM | POA: Diagnosis not present

## 2015-04-21 DIAGNOSIS — I63411 Cerebral infarction due to embolism of right middle cerebral artery: Secondary | ICD-10-CM

## 2015-04-21 DIAGNOSIS — I480 Paroxysmal atrial fibrillation: Secondary | ICD-10-CM

## 2015-04-21 DIAGNOSIS — G609 Hereditary and idiopathic neuropathy, unspecified: Secondary | ICD-10-CM | POA: Diagnosis not present

## 2015-04-21 DIAGNOSIS — I10 Essential (primary) hypertension: Secondary | ICD-10-CM | POA: Diagnosis not present

## 2015-04-21 MED ORDER — CYCLOBENZAPRINE HCL 5 MG PO TABS: 5.0000 mg | ORAL_TABLET | Freq: Three times a day (TID) | ORAL | Status: DC | PRN

## 2015-04-21 NOTE — Progress Notes (Signed)
STROKE NEUROLOGY FOLLOW UP NOTE  NAME: [name] DOB: August 01, 1940  REASON FOR VISIT: stroke follow up HISTORY FROM: pt and chart  Today we had the pleasure of seeing [name] in follow-up at our Neurology Clinic. Pt was accompanied by no one.   History Summary [name] is a 74 y.o. female with history of hypertension, diabetes, fibromyalgia, large cell lymphona was admitted on 02/16/15 for dizziness with left-sided weakness and numbness. [name] did not receive IV t-PA due to resolving deficits w/ minimal residual. MRI showed small right frontal infarct. In ED, [name] was also found to have afib on EKG, which confirmed by cardiology later. Her symptoms resolved fully and stroke work up including MRA, CUS, TTE negative. LDL 120 and A1C 6.4. [name] was initially put on Xarelto, but later switched to eliquis due to Cre 1.5. [name] was discharged with eliquis and pravastatin.  Interval History During the interval time, the patient has been doing well. Continued on eliquis 5mg  bid and pravastatin without side effects. Her Cre recheck was WNL one month ago. [name] followed up with Dr. Stanford Breed in clinic and add metoprolol for rate control. [name] has been following with Dr. 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 Today [name] came in with neck muscle pain and spasm due to not sleep in good position. Will prescribe flexeril to help. Her BP still high 148/93 and [name] said at home her BP was 150s/100. Dr. Maudie Mercury is actively working on that with her. Her metoprolol now increased to 25mg  bid.  REVIEW OF SYSTEMS: Full 14 system review of systems performed and notable only for those listed below and in HPI above, all others are negative:  Constitutional:  Fatigue, excessive sweating Cardiovascular:  Ear/Nose/Throat:   Skin:  Eyes:  Light sensitivity Respiratory:   Gastroitestinal:  diarrhea Genitourinary:  Hematology/Lymphatic:   Endocrine:    Musculoskeletal:  Aching muscle, muscle cramp, neck pain, neck stiffness Allergy/Immunology:   Neurological:  Dizziness, HA Psychiatric: nervous/anxious Sleep: snoring  The following represents the patient's updated allergies and side effects list: No Known Allergies  The neurologically relevant items on the patient's problem list were reviewed on today's visit.  Neurologic Examination  A problem focused neurological exam (12 or more points of the single system neurologic examination, vital signs counts as 1 point, cranial nerves count for 8 points) was performed.  Blood pressure 148/93, pulse 73, height 4\' 11"  (1.499 m), weight 177 lb 6.4 oz (80.468 kg).  General - Well nourished, well developed, in no apparent distress.  Ophthalmologic - Fundi not visualized due to light sensitivity.  Cardiovascular - Regular rate and rhythm with no murmur.  Neck - limited ROM, with neck pain on movement, muscle tenderness on deep palpation, kept in forward position.  Mental Status -  Level of arousal and orientation to time, place, and person were intact. Language including expression, naming, repetition, comprehension was assessed and found intact. Fund of Knowledge was assessed and was intact.  Cranial Nerves II - XII - II - Visual field intact OU. III, IV, VI - Extraocular movements intact. V - Facial sensation intact bilaterally. VII - Facial movement intact bilaterally. VIII - Hearing & vestibular intact bilaterally. X - Palate elevates symmetrically. XI - Chin turning & shoulder shrug intact bilaterally. XII - Tongue protrusion intact.  Motor Strength - The patient's strength was normal in all extremities and pronator drift was absent.  Bulk was normal and  fasciculations were absent.   Motor Tone - Muscle tone was assessed at the neck and appendages and was normal.  Reflexes - The patient's reflexes were 1+ in all extremities and [name] had no pathological reflexes.  Sensory -  Light touch, temperature/pinprick, vibration and proprioception, and Romberg testing were assessed and were normal.    Coordination - The patient had normal movements in the hands and feet with no ataxia or dysmetria.  Tremor was absent.  Gait and Station - The patient's transfers, posture, gait, station, and turns were observed as normal.  Data reviewed: I personally reviewed the images and agree with the radiology interpretations.   Dg Chest 2 View 02/17/2015 No active cardiopulmonary disease.   Ct Head Wo Contrast 02/16/2015 No acute intracranial finding. Critical   Mr Brain Wo Contrast 02/17/2015 1. Subcentimeter acute embolic infarct in the posterior right frontal lobe. 2. Mild chronic small vessel ischemic disease.   Mr [name] Head/brain Wo Cm 02/17/2015 Unremarkable head MRA.   Carotid Doppler There is 1-39% bilateral ICA stenosis. Vertebral artery flow is antegrade.   2D echo - - Left ventricle: The cavity size was normal. Systolic function was normal. The estimated ejection fraction was in the range of 50% to 55%. Wall motion was normal; there were no regional wall motion abnormalities. There was a reduced contribution of atrial contraction to ventricular filling, due to increased ventricular diastolic pressure or atrial contractile dysfunction. Doppler parameters are consistent with a reversible restrictive pattern, indicative of decreased left ventricular diastolic compliance and/or increased left atrial pressure (grade 3 diastolic dysfunction). - Aortic valve: Moderately calcified annulus. Trileaflet. Thickening. Mild focal thickening and calcification. - Mitral valve: There was trivial regurgitation. - Tricuspid valve: There was mild regurgitation. - Pulmonary arteries: PA peak pressure: 32 mm Hg (S).  Component     Latest Ref Rng 02/17/2015  Cholesterol     0 - 200 mg/dL 182  Triglycerides     <150 mg/dL 61  HDL Cholesterol      >40 mg/dL 50  Total CHOL/HDL Ratio      3.6  VLDL     0 - 40 mg/dL 12  LDL (calc)     0 - 99 mg/dL 120 (H)  Hemoglobin A1C     4.8 - 5.6 % 6.2 (H)  Mean Plasma Glucose      131  TSH     0.350 - 4.500 uIU/mL 1.925    Assessment: As you may recall, [name] is a 74 y.o. Caucasian female with PMH of hypertension, diabetes, fibromyalgia, large cell lymphona was admitted on 02/16/15 for small right frontal infarct. [name] was also found to have afib on EKG, which confirmed by cardiology later. Her symptoms resolved fully and stroke work up including MRA, CUS, TTE negative. LDL 120 and A1C 6.4. [name] was put on eliquis and pravastatin. During the interval time, [name] is doing well without recurrent symptoms. Followed with cardiology, on metoprolol. No side effect from eliquis. BP still not in good control.  Plan:  - continue eliquis and pravastatin for stroke prevention - check BP at home and record and bring over to Dr. Maudie Mercury for medication adjustment - check glucose at home - take flexeril for neck pain relieve - Follow up with your primary care physician for stroke risk factor modification. Recommend maintain blood pressure goal <130/80, diabetes with hemoglobin A1c goal below 6.5% and lipids with LDL cholesterol goal below 70 mg/dL.  - follow up in 4 months.  I spent more  than 25 minutes of face to face time with the patient. Greater than 50% of time was spent in counseling and coordination of care. We have discussed about stroke prevention, afib management and neck pain with muscle spasm.    No orders of the defined types were placed in this encounter.    Meds ordered this encounter  Medications  . PREVNAR 13 SUSP injection    Sig: TO BE ADMINISTERED BY PHARMACIST FOR IMMUNIZATION    Refill:  0  . FLUZONE HIGH-DOSE 0.5 ML SUSY    Sig: INJECT 1 DOSE AS DIRECTED.    Refill:  0  . cyclobenzaprine (FLEXERIL) 5 MG tablet    Sig: Take 1 tablet (5 mg total) by mouth 3 (three) times daily as needed  for muscle spasms.    Dispense:  30 tablet    Refill:  0    Patient Instructions  - continue eliquis and pravastatin for stroke prevention - check BP at home and record and bring over to Dr. Maudie Mercury for medication adjustment - check glucose at home - take flexeril for neck pain - Follow up with your primary care physician for stroke risk factor modification. Recommend maintain blood pressure goal <130/80, diabetes with hemoglobin A1c goal below 6.5% and lipids with LDL cholesterol goal below 70 mg/dL.  - regular exercise - follow up in 4 months.   Rosalin Hawking, MD PhD [facility] [address] [place], [place] [address] [phone]

## 2015-04-21 NOTE — Patient Instructions (Addendum)
-   continue eliquis and pravastatin for stroke prevention - check BP at home and record and bring over to Dr. Maudie Mercury for medication adjustment - check glucose at home - take flexeril for neck pain - Follow up with your primary care physician for stroke risk factor modification. Recommend maintain blood pressure goal <130/80, diabetes with hemoglobin A1c goal below 6.5% and lipids with LDL cholesterol goal below 70 mg/dL.  - regular exercise - follow up in 4 months.

## 2015-07-07 ENCOUNTER — Other Ambulatory Visit: Payer: Self-pay | Admitting: Internal Medicine

## 2015-07-07 DIAGNOSIS — R42 Dizziness and giddiness: Secondary | ICD-10-CM

## 2015-07-09 ENCOUNTER — Ambulatory Visit
Admission: RE | Admit: 2015-07-09 | Discharge: 2015-07-09 | Disposition: A | Discharge status: Home / Self Care | Payer: Medicare Other | Source: Ambulatory Visit | Attending: Internal Medicine | Admitting: Internal Medicine

## 2015-07-09 DIAGNOSIS — R42 Dizziness and giddiness: Secondary | ICD-10-CM

## 2015-08-25 ENCOUNTER — Ambulatory Visit: Payer: Medicare Other | Admitting: Neurology

## 2015-08-26 ENCOUNTER — Encounter: Payer: Self-pay | Admitting: Neurology

## 2015-09-21 ENCOUNTER — Encounter: Payer: Self-pay | Admitting: Neurology

## 2015-09-21 ENCOUNTER — Ambulatory Visit (INDEPENDENT_AMBULATORY_CARE_PROVIDER_SITE_OTHER): Payer: Medicare Other | Admitting: Neurology

## 2015-09-21 VITALS — BP 147/88 | HR 74 | Ht 59.0 in | Wt 178.4 lb

## 2015-09-21 DIAGNOSIS — I480 Paroxysmal atrial fibrillation: Secondary | ICD-10-CM

## 2015-09-21 DIAGNOSIS — Z7901 Long term (current) use of anticoagulants: Secondary | ICD-10-CM | POA: Diagnosis not present

## 2015-09-21 DIAGNOSIS — I10 Essential (primary) hypertension: Secondary | ICD-10-CM

## 2015-09-21 DIAGNOSIS — E785 Hyperlipidemia, unspecified: Secondary | ICD-10-CM

## 2015-09-21 DIAGNOSIS — I63411 Cerebral infarction due to embolism of right middle cerebral artery: Secondary | ICD-10-CM | POA: Diagnosis not present

## 2015-09-21 NOTE — Progress Notes (Signed)
STROKE NEUROLOGY FOLLOW UP NOTE  NAME: [name] DOB: December 10, 1940  REASON FOR VISIT: stroke follow up HISTORY FROM: pt and chart  Today we had the pleasure of seeing [name] in follow-up at our Neurology Clinic. Pt was accompanied by no one.   History Summary [name] is a 74 y.o. female with history of hypertension, diabetes, fibromyalgia, large cell lymphona was admitted on 02/16/15 for dizziness with left-sided weakness and numbness. She did not receive IV t-PA due to resolving deficits w/ minimal residual. MRI showed small right frontal infarct. In ED, she was also found to have afib on EKG, which confirmed by cardiology later. Her symptoms resolved fully and stroke work up including MRA, CUS, TTE negative. LDL 120 and A1C 6.4. She was initially put on Xarelto, but later switched to eliquis due to Cre 1.5. She was discharged with eliquis and pravastatin.  04/21/15 follow up - the patient has been doing well. Continued on eliquis 5mg  bid and pravastatin without side effects. Her Cre recheck was WNL one month ago. She followed up with Dr. Stanford Breed in clinic and add metoprolol for rate control. She has been following with Dr. 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 Today she came in with neck muscle pain and spasm due to not sleep in good position. Will prescribe flexeril to help. Her BP still high 148/93 and she said at home her BP was 150s/100. Dr. Maudie Mercury is actively working on that with her. Her metoprolol now increased to 25mg  bid.  Interval History During the interval time, the pt has been doing well. No recurrent stroke like symptoms. She had HA in 06/2015 and had MRI brain done showed no acute abnormalities. She is taking eliquis without side effects. BP fluctuating at home and Dr. Maudie Mercury added HCTZ on her rigemen. Glucose controlled well. Today in clinic BP 147/88.   REVIEW OF SYSTEMS: Full 14 system review  of systems performed and notable only for those listed below and in HPI above, all others are negative:  Constitutional:   Cardiovascular:  Ear/Nose/Throat:   Skin:  Eyes:   Respiratory:   Gastroitestinal:  Genitourinary:  Hematology/Lymphatic:   Endocrine:  Musculoskeletal:   Allergy/Immunology:   Neurological:  Dizziness, numbness, memory loss Psychiatric:  Sleep:   The following represents the patient's updated allergies and side effects list: No Known Allergies  The neurologically relevant items on the patient's problem list were reviewed on today's visit.  Neurologic Examination  A problem focused neurological exam (12 or more points of the single system neurologic examination, vital signs counts as 1 point, cranial nerves count for 8 points) was performed.  Blood pressure 147/88, pulse 74, height 4\' 11"  (1.499 m), weight 178 lb 6.4 oz (80.922 kg).  General - Well nourished, well developed, in no apparent distress.  Ophthalmologic - Fundi not visualized due to light sensitivity.  Cardiovascular - Regular rate and rhythm with no murmur.  Mental Status -  Level of arousal and orientation to time, place, and person were intact. Language including expression, naming, repetition, comprehension was assessed and found intact. Fund of Knowledge was assessed and was intact.  Cranial Nerves II - XII - II - Visual field intact OU. III, IV, VI - Extraocular movements intact. V - Facial sensation intact bilaterally. VII - Facial movement intact bilaterally. VIII - Hearing & vestibular intact bilaterally. X - Palate elevates symmetrically. XI - Chin turning & shoulder shrug  intact bilaterally. XII - Tongue protrusion intact.  Motor Strength - The patient's strength was normal in all extremities and pronator drift was absent.  Bulk was normal and fasciculations were absent.   Motor Tone - Muscle tone was assessed at the neck and appendages and was normal.  Reflexes - The  patient's reflexes were 1+ in all extremities and she had no pathological reflexes.  Sensory - Light touch, temperature/pinprick, vibration and proprioception, and Romberg testing were assessed and were normal.    Coordination - The patient had normal movements in the hands and feet with no ataxia or dysmetria.  Tremor was absent.  Gait and Station - The patient's transfers, posture, gait, station, and turns were observed as normal.  Data reviewed: I personally reviewed the images and agree with the radiology interpretations.   Dg Chest 2 View 02/17/2015 No active cardiopulmonary disease.   Ct Head Wo Contrast 02/16/2015 No acute intracranial finding. Critical   Mr Brain Wo Contrast 02/17/2015 1. Subcentimeter acute embolic infarct in the posterior right frontal lobe. 2. Mild chronic small vessel ischemic disease.   Mr Jodene Nam Head/brain Wo Cm 02/17/2015 Unremarkable head MRA.   Carotid Doppler There is 1-39% bilateral ICA stenosis. Vertebral artery flow is antegrade.   2D echo - - Left ventricle: The cavity size was normal. Systolic function was normal. The estimated ejection fraction was in the range of 50% to 55%. Wall motion was normal; there were no regional wall motion abnormalities. There was a reduced contribution of atrial contraction to ventricular filling, due to increased ventricular diastolic pressure or atrial contractile dysfunction. Doppler parameters are consistent with a reversible restrictive pattern, indicative of decreased left ventricular diastolic compliance and/or increased left atrial pressure (grade 3 diastolic dysfunction). - Aortic valve: Moderately calcified annulus. Trileaflet. Thickening. Mild focal thickening and calcification. - Mitral valve: There was trivial regurgitation. - Tricuspid valve: There was mild regurgitation. - Pulmonary arteries: PA peak pressure: 32 mm Hg (S).  Component     Latest Ref Rng 02/17/2015   Cholesterol     0 - 200 mg/dL 182  Triglycerides     <150 mg/dL 61  HDL Cholesterol     >40 mg/dL 50  Total CHOL/HDL Ratio      3.6  VLDL     0 - 40 mg/dL 12  LDL (calc)     0 - 99 mg/dL 120 (H)  Hemoglobin A1C     4.8 - 5.6 % 6.2 (H)  Mean Plasma Glucose      131  TSH     0.350 - 4.500 uIU/mL 1.925    Assessment: As you may recall, she is a 75 y.o. Caucasian female with PMH of hypertension, diabetes, fibromyalgia, large cell lymphona was admitted on 02/16/15 for small right frontal infarct. She was also found to have afib on EKG, which confirmed by cardiology later. Her symptoms resolved fully and stroke work up including MRA, CUS, TTE negative. LDL 120 and A1C 6.4. She was put on eliquis and pravastatin. During the interval time, she is doing well without recurrent symptoms. Followed with cardiology, on metoprolol. No side effect from eliquis. BP still not in good control. MRI in 06/2015 without acute changes.  Plan:  - continue eliquis and pravastatin for stroke prevention - check BP and glucose at home and record and bring over to Dr. Maudie Mercury for medication adjustment - follow up with cardiology for afib - Follow up with your primary care physician for stroke risk factor modification. Recommend  maintain blood pressure goal <130/80, diabetes with hemoglobin A1c goal below 6.5% and lipids with LDL cholesterol goal below 70 mg/dL.  - follow up as needed.   No orders of the defined types were placed in this encounter.    Meds ordered this encounter  Medications  . hydrochlorothiazide (HYDRODIURIL) 12.5 MG tablet    Sig: Take 12.5 mg by mouth daily.    Patient Instructions  - continue eliquis and pravastatin for stroke prevention - check BP and glucose at home and record and bring over to Dr. Maudie Mercury for medication adjustment - follow up with cardiology for afib - Follow up with your primary care physician for stroke risk factor modification. Recommend maintain blood pressure goal  <130/80, diabetes with hemoglobin A1c goal below 6.5% and lipids with LDL cholesterol goal below 70 mg/dL.  - follow up as needed.    Rosalin Hawking, MD PhD [facility] [address] [place], [place] [address] [phone]

## 2015-09-21 NOTE — Patient Instructions (Signed)
-   continue eliquis and pravastatin for stroke prevention - check BP and glucose at home and record and bring over to Dr. Maudie Mercury for medication adjustment - follow up with cardiology for afib - Follow up with your primary care physician for stroke risk factor modification. Recommend maintain blood pressure goal <130/80, diabetes with hemoglobin A1c goal below 6.5% and lipids with LDL cholesterol goal below 70 mg/dL.  - follow up as needed.

## 2016-01-01 ENCOUNTER — Other Ambulatory Visit: Payer: Self-pay | Admitting: Neurology

## 2016-02-08 ENCOUNTER — Other Ambulatory Visit: Payer: Self-pay | Admitting: Neurology

## 2016-02-08 ENCOUNTER — Other Ambulatory Visit: Payer: Self-pay | Admitting: *Deleted

## 2016-02-08 MED ORDER — GABAPENTIN 300 MG PO CAPS: 300.0000 mg | ORAL_CAPSULE | Freq: Three times a day (TID) | ORAL | 3 refills | Status: DC

## 2016-02-08 NOTE — Progress Notes (Signed)
Rx sent to [facility] first by accident. Called and spoke to Joe at pharmacy. He cx. [facility] to [facility]. Receipt confirmed by pharmacy.   **Future refills must come from PCP. Dr [name] okay with refilling this once time since pt has seen Dr Erlinda Hong in office recently.

## 2016-02-16 ENCOUNTER — Telehealth: Payer: Self-pay | Admitting: *Deleted

## 2016-02-16 NOTE — Telephone Encounter (Signed)
Tramadol 50mg  [facility] [phone]  , [phone].   (new prescription request). Have seen for PN and carpal tunnel.   Did you want to refill?  Has seen Dr. Erlinda Hong for stroke. Please advise.

## 2016-02-17 NOTE — Telephone Encounter (Signed)
PCP to refill if still needed. Thanks.   Rosalin Hawking, MD PhD Stroke Neurology 02/17/2016 10:46 PM

## 2016-02-17 NOTE — Telephone Encounter (Signed)
Dr. Erlinda Hong in last note you told patient to follow up as needed. She was last seen in April 2017. She wants tramadol but you saw her the last two times. Do I tell patient to seek PCP. Advise me thanks.

## 2016-02-17 NOTE — Telephone Encounter (Signed)
[name] thanks

## 2016-02-18 NOTE — Telephone Encounter (Signed)
Rn call patient and she was busy, .LFt vm for patient that her PCP has to do refills. Pt was to follow up with PCP ongoing.

## 2016-07-28 ENCOUNTER — Other Ambulatory Visit: Payer: Self-pay | Admitting: Internal Medicine

## 2016-07-28 DIAGNOSIS — R945 Abnormal results of liver function studies: Secondary | ICD-10-CM

## 2016-08-03 ENCOUNTER — Ambulatory Visit
Admission: RE | Admit: 2016-08-03 | Discharge: 2016-08-03 | Disposition: A | Discharge status: Home / Self Care | Payer: Medicare Other | Source: Ambulatory Visit | Attending: Internal Medicine | Admitting: Internal Medicine

## 2016-08-03 DIAGNOSIS — R945 Abnormal results of liver function studies: Secondary | ICD-10-CM

## 2016-10-24 ENCOUNTER — Other Ambulatory Visit: Payer: Self-pay | Admitting: Internal Medicine

## 2016-10-24 DIAGNOSIS — Z1231 Encounter for screening mammogram for malignant neoplasm of breast: Secondary | ICD-10-CM

## 2016-11-01 ENCOUNTER — Other Ambulatory Visit (HOSPITAL_BASED_OUTPATIENT_CLINIC_OR_DEPARTMENT_OTHER): Payer: Self-pay | Admitting: Internal Medicine

## 2016-11-01 DIAGNOSIS — Z1231 Encounter for screening mammogram for malignant neoplasm of breast: Secondary | ICD-10-CM

## 2016-11-10 ENCOUNTER — Ambulatory Visit: Payer: Medicare Other

## 2016-11-10 ENCOUNTER — Ambulatory Visit (HOSPITAL_BASED_OUTPATIENT_CLINIC_OR_DEPARTMENT_OTHER)
Admission: RE | Admit: 2016-11-10 | Discharge: 2016-11-10 | Disposition: A | Discharge status: Home / Self Care | Payer: Medicare Other | Source: Ambulatory Visit | Attending: Internal Medicine | Admitting: Internal Medicine

## 2016-11-10 DIAGNOSIS — Z1231 Encounter for screening mammogram for malignant neoplasm of breast: Secondary | ICD-10-CM

## 2017-03-02 ENCOUNTER — Encounter: Payer: Self-pay | Admitting: Physician Assistant

## 2017-03-02 ENCOUNTER — Ambulatory Visit (INDEPENDENT_AMBULATORY_CARE_PROVIDER_SITE_OTHER): Payer: Medicare Other | Admitting: Physician Assistant

## 2017-03-02 VITALS — BP 154/88 | HR 63 | Ht 59.0 in | Wt 173.0 lb

## 2017-03-02 DIAGNOSIS — M545 Low back pain: Secondary | ICD-10-CM | POA: Diagnosis not present

## 2017-03-02 DIAGNOSIS — I480 Paroxysmal atrial fibrillation: Secondary | ICD-10-CM | POA: Diagnosis not present

## 2017-03-02 DIAGNOSIS — I10 Essential (primary) hypertension: Secondary | ICD-10-CM | POA: Diagnosis not present

## 2017-03-02 DIAGNOSIS — E785 Hyperlipidemia, unspecified: Secondary | ICD-10-CM

## 2017-03-02 DIAGNOSIS — E119 Type 2 diabetes mellitus without complications: Secondary | ICD-10-CM

## 2017-03-02 NOTE — Progress Notes (Signed)
Cardiology Office Note    Date:  03/03/2017   ID:  [name], DOB 1940/07/27, MRN [id number]  PCP:  [name], MD  Cardiologist:  Dr. Stanford Breed   Chief Complaint  Patient presents with  . Follow-up    needs clearance for back injection    Cardiology visit requested by Dr. Nelva Bush from [place]  History of Present Illness:  [name] is a 76 y.o. female with PMH of DM II, HTN, HLD, Large cell lymphoma, TIA and history of atrial fibrillation. Patient was admitted in August 2016 with TIA and the noted to be in atrial fibrillation at the time. She was treated with anticoagulation therapy. Echocardiogram obtained in August 2016 showed normal LV function, mild tricuspid regurg, trace mitral regurg. Carotid Doppler in August 2016 showed mild disease bilaterally. He was last seen by Dr. Stanford Breed in September 2016, at which time he was doing well. His isosorbide was discontinued, Toprol XL was added for better rate control. She was supposed to return in 6 months for follow-up, he has been lost to follow-up since.  Patient presents today for cardiology office visit. Her blood pressure is elevated today, however she says she just took her blood pressure medication right prior to coming to the office. Normally her blood pressure is very well-controlled. I rechecked the blood pressure myself manually, it came back 148/80. Given the prior history of grade 3 diastolic dysfunction on echocardiogram, she will need to aggressively control her blood pressure. Orthopedic surgery service request her to come off the eliquis for 3 days prior to lumbar steroid injection. She does water aerobic for 40 minutes 3 times a week, she denies any exertional chest discomfort or shortness breath. She has no lower extremity edema, orthopnea or PND. She can follow-up in 6 months.   Past Medical History:  Diagnosis Date  . Cancer ([place])   . Diabetes mellitus   . Fibromyalgia   . Hyperlipidemia    . Hypertension   . Lymphoma malignant, large cell ([place])   . Neuropathy   . Stroke ([place])   . Urinary tract infection 09/19/11   Being treated at this time Cipro    Past Surgical History:  Procedure Laterality Date  . APPENDECTOMY    . CATARACT EXTRACTION  2015  . CHOLECYSTECTOMY    . JOINT REPLACEMENT    . JOINT REPLACEMENT    . ROTATOR CUFF REPAIR      Current Medications: Outpatient Medications Prior to Visit  Medication Sig Dispense Refill  . apixaban (ELIQUIS) 5 MG TABS tablet Take 1 tablet (5 mg total) by mouth 2 (two) times daily. 60 tablet 3  . BIOTIN 5000 PO Take 1 tablet by mouth daily.    . Cholecalciferol (VITAMIN D PO) Take 1,000 Units by mouth daily.    . cyclobenzaprine (FLEXERIL) 5 MG tablet Take 1 tablet (5 mg total) by mouth 3 (three) times daily as needed for muscle spasms. 30 tablet 0  . ezetimibe (ZETIA) 10 MG tablet Take 1 tablet (10 mg total) by mouth daily. (Patient taking differently: Take 5 mg by mouth daily. For cholesterol) 30 tablet 3  . FLUZONE HIGH-DOSE 0.5 ML SUSY INJECT 1 DOSE AS DIRECTED.  0  . furosemide (LASIX) 20 MG tablet Take 1 tablet (20 mg total) by mouth every other day as needed for fluid or edema. 30 tablet 0  . gabapentin (NEURONTIN) 300 MG capsule Take 1 capsule (300 mg total) by mouth 3 (three) times daily. [place]aker  capsule 3  . hydrochlorothiazide (HYDRODIURIL) 12.5 MG tablet Take 12.5 mg by mouth daily.    . metFORMIN (GLUCOPHAGE) 500 MG tablet Take 500 mg by mouth daily.    . metoprolol succinate (TOPROL XL) 25 MG 24 hr tablet Take 0.5 tablets (12.5 mg total) by mouth daily. (Patient taking differently: Take 25 mg by mouth 2 (two) times daily. ) 90 tablet 3  . Multiple Vitamins-Minerals (CENTRUM SILVER PO) Take 1 tablet by mouth daily.    Marland Kitchen olmesartan (BENICAR) 40 MG tablet Take 1 tablet (40 mg total) by mouth daily. 30 tablet 3  . Omega-3 Fatty Acids (FISH OIL PO) Take 1,200 mg by mouth daily.    . pravastatin (PRAVACHOL) 20 MG tablet Take  1 tablet (20 mg total) by mouth daily. 30 tablet 0  . PREVNAR 13 SUSP injection TO BE ADMINISTERED BY PHARMACIST FOR IMMUNIZATION  0  . traMADol (ULTRAM) 50 MG tablet Take 1 tablet (50 mg total) by mouth every 6 (six) hours as needed. (Patient taking differently: Take 50 mg by mouth every 6 (six) hours as needed for moderate pain or severe pain. ) 50 tablet 4   No facility-administered medications prior to visit.      Allergies:   Patient has no known allergies.   Social History   Social History  . Marital status: Married    Spouse name: [name]   . Number of children: 6  . Years of education: 55   Occupational History  . Retired     Social History Main Topics  . Smoking status: Never Smoker  . Smokeless tobacco: Never Used  . Alcohol use 0.6 oz/week    1 Glasses of wine per week     Comment: Rare  . Drug use: No  . Sexual activity: Not Asked   Other Topics Concern  . None   Social History Narrative   Lives at home with husband, [name].   Caffeine use: 2-3 cups coffee per day     Family History:  The patient's family history includes Cancer in her sister; Heart Problems in her brother.   ROS:   Please see the history of present illness.    ROS All other systems reviewed and are negative.   PHYSICAL EXAM:   VS:  BP (!) 154/88   Pulse 63   Ht 4\' 11"  (1.499 m)   Wt 173 lb (78.5 kg)   BMI 34.94 kg/m    GEN: Well nourished, well developed, in no acute distress  HEENT: normal  Neck: no JVD, carotid bruits, or masses Cardiac: RRR; no murmurs, rubs, or gallops,no edema  Respiratory:  clear to auscultation bilaterally, normal work of breathing GI: soft, nontender, nondistended, + BS MS: no deformity or atrophy  Skin: warm and dry, no rash Neuro:  Alert and Oriented x 3, Strength and sensation are intact Psych: euthymic mood, full affect  Wt Readings from Last 3 Encounters:  03/02/17 173 lb (78.5 kg)  09/21/15 178 lb 6.4 oz (80.9 kg)  04/21/15 177 lb 6.4 oz (80.5 kg)       Studies/Labs Reviewed:   EKG:  EKG is ordered today.  The ekg ordered today demonstrates Normal sinus rhythm, T-wave inversion in inferolateral leads, unchanged compared to 2016.  Recent Labs: No results found for requested labs within last 8760 hours.   Lipid Panel    Component Value Date/Time   CHOL 182 02/17/2015 0535   TRIG 61 02/17/2015 0535   HDL 50 02/17/2015 0535  CHOLHDL 3.6 02/17/2015 0535   VLDL 12 02/17/2015 0535   LDLCALC 120 (H) 02/17/2015 0535    Additional studies/ records that were reviewed today include:   Echo 02/17/2015 LV EF: 50% -   55%  Study Conclusions  - Left ventricle: The cavity size was normal. Systolic function was   normal. The estimated ejection fraction was in the range of 50%   to 55%. Wall motion was normal; there were no regional wall   motion abnormalities. There was a reduced contribution of atrial   contraction to ventricular filling, due to increased ventricular   diastolic pressure or atrial contractile dysfunction. Doppler   parameters are consistent with a reversible restrictive pattern,   indicative of decreased left ventricular diastolic compliance   and/or increased left atrial pressure (grade 3 diastolic   dysfunction). - Aortic valve: Moderately calcified annulus. Trileaflet.   Thickening. Mild focal thickening and calcification. - Mitral valve: There was trivial regurgitation. - Tricuspid valve: There was mild regurgitation. - Pulmonary arteries: PA peak pressure: 32 mm Hg (S).   Carotid US 02/17/2015 - The vertebral arteries appear patent with antegrade flow. - Findings consistent with 1- 39 percent stenosis involving the   right internal carotid artery and the left internal carotid   artery.    MRI brain 07/09/2015 IMPRESSION: 1.  No acute intracranial abnormality. 2. Stable noncontrast MRI appearance of the brain since August, with mild for age nonspecific cerebral white matter signal changes  most commonly due to chronic small vessel disease.   ASSESSMENT:    1. Low back pain, unspecified back pain laterality, unspecified chronicity, with sciatica presence unspecified   2. PAF (paroxysmal atrial fibrillation) ([place])   3. Essential hypertension   4. Hyperlipidemia, unspecified hyperlipidemia type   5. Controlled type 2 diabetes mellitus without complication, without long-term current use of insulin ([facility])      PLAN:  In order of problems listed above:  1. Back pain pending lumbar steroid injection: Requested by Dr. Nelva Bush of orthopedic surgery. She will stop her eliquis for 3 days prior to injection. No additional workup is needed. She will need to restart eliquis as soon as possible once deemed low bleeding risk by orthopedic physician.  2. PAF: Currently maintaining sinus rhythm, continue Toprol-XL.  3. Hypertension: Blood pressure elevated today, however she says she just took her morning blood pressure medication hour prior to arrival  4. Hyperlipidemia: Continue pravastatin 20 mg daily, will require annual fasting lipid panel primary care provider.  5. DM 2: On metformin, managed by primary care provider    Medication Adjustments/Labs and Tests Ordered: Current medicines are reviewed at length with the patient today.  Concerns regarding medicines are outlined above.  Medication changes, Labs and Tests ordered today are listed in the Patient Instructions below. Patient Instructions  Medication Instructions:   No changes  Labwork:   none  Testing/Procedures:  none  Follow-Up:  6 months with Dr. Stanford Breed  We will send your letter of clearance to [facility].  If you need a refill on your cardiac medications before your next appointment, please call your pharmacy.      Hilbert Corrigan, [place]  03/03/2017 11:13 PM    [facility] [address], [place], [place]  [address] Phone: [phone]; Fax: [phone]

## 2017-03-02 NOTE — Patient Instructions (Signed)
Medication Instructions:   No changes  Labwork:   none  Testing/Procedures:  none  Follow-Up:  6 months with Dr. Stanford Breed  We will send your letter of clearance to [facility].  If you need a refill on your cardiac medications before your next appointment, please call your pharmacy.

## 2017-03-03 ENCOUNTER — Encounter: Payer: Self-pay | Admitting: Physician Assistant

## 2017-07-13 ENCOUNTER — Telehealth: Payer: Self-pay | Admitting: Cardiology

## 2017-07-13 NOTE — Telephone Encounter (Signed)
Spoke with pt, she is having 2 implants and a new bridge, she is wondering if she will need to hold the eliquis. She reports the dentist is aware she is taking eliquis and did not say anything about her needing to hold it. Explained to the patient we usually let the dentist guide [place] as to whether or not it needs to be held. She will ask the dentist and let me know.

## 2017-07-13 NOTE — Telephone Encounter (Signed)
Please call,pt says she is going to have some dental work and need to ask you some questions.

## 2017-07-26 ENCOUNTER — Telehealth: Payer: Self-pay | Admitting: Cardiology

## 2017-07-26 NOTE — Telephone Encounter (Signed)
Discontinue apixaban 2 days prior to procedure and resume the day after. Kirk Ruths

## 2017-07-26 NOTE — Telephone Encounter (Signed)
Spoke with Dr Shana Chute office, this note will be faxed to [phone].

## 2017-07-26 NOTE — Telephone Encounter (Signed)
Incoming call from the dentist office for the patient. He stated that the patient needs to have the remaining top teeth extracted and would like for the patient to be able to hold eliquis for at least 48 hours (if not more). He is concerned about extensive bleeding. Message routed to the provider.

## 2017-09-15 ENCOUNTER — Ambulatory Visit (INDEPENDENT_AMBULATORY_CARE_PROVIDER_SITE_OTHER): Payer: Medicare Other | Admitting: Cardiology

## 2017-09-15 ENCOUNTER — Encounter: Payer: Self-pay | Admitting: Cardiology

## 2017-09-15 VITALS — BP 132/74 | HR 79 | Ht 59.0 in | Wt 174.8 lb

## 2017-09-15 DIAGNOSIS — Z8673 Personal history of transient ischemic attack (TIA), and cerebral infarction without residual deficits: Secondary | ICD-10-CM

## 2017-09-15 DIAGNOSIS — I119 Hypertensive heart disease without heart failure: Secondary | ICD-10-CM | POA: Diagnosis not present

## 2017-09-15 DIAGNOSIS — I480 Paroxysmal atrial fibrillation: Secondary | ICD-10-CM

## 2017-09-15 DIAGNOSIS — C8590 Non-Hodgkin lymphoma, unspecified, unspecified site: Secondary | ICD-10-CM | POA: Diagnosis not present

## 2017-09-15 NOTE — Assessment & Plan Note (Addendum)
Pt with a history of PAF- NSR in Sept 2018, now back in AF with CVR. She is asymptomatic and on chronic anticoagulation. CAHDs VASC=8 (mild vascular disease on carotid doppler)

## 2017-09-15 NOTE — Patient Instructions (Signed)
Medication Instructions: Your physician recommends that you continue on your current medications as directed. Please refer to the Current Medication list given to you today.  If you need a refill on your cardiac medications before your next appointment, please call your pharmacy.    Follow-Up: Your physician wants you to follow-up in 6 months with Dr. Hochrein. You will receive a reminder letter in the mail two months in advance. If you don't receive a letter, please call our office at [phone] to schedule this follow-up appointment.   Thank you for choosing [facility]!!       

## 2017-09-15 NOTE — Assessment & Plan Note (Signed)
Followed by PCP- her LDL was 48 on a lipid panel this month

## 2017-09-15 NOTE — Assessment & Plan Note (Signed)
I believe this was in 2016- no recurrance

## 2017-09-15 NOTE — Assessment & Plan Note (Signed)
Echo in Aug 2016 showed her EF to be 50-55% with grade 3 DD

## 2017-09-15 NOTE — Progress Notes (Addendum)
09/15/2017 CHANNEL PAPANDREA   01-27-1941  [id number]  Primary Physician [name], MD Primary Cardiologist: Dr Stanford Breed  HPI:  [name] 77 y/o female with a history of PAF, TIA, HTN with HCVD, NIDDM, and treated HLD. She is on chronic anticoagulation with Eliquis. She was seen in Sept 2018 as a pre op (back injection) clearance. She was in NSR then. Since we saw her last she had dental extraction and under Dr Jacalyn Lefevre guidance held her Eliquis for two days pre op. Despite this she has quit a bit of facial ecchymosis but otherwise is recovering well. She denies any palpitations, near syncope, or unusual dyspnea. She has spinal stenosis and that is her most limiting medical issue.    Current Outpatient Medications  Medication Sig Dispense Refill  . apixaban (ELIQUIS) 5 MG TABS tablet Take 1 tablet (5 mg total) by mouth 2 (two) times daily. 60 tablet 3  . BIOTIN 5000 PO Take 1 tablet by mouth daily.    . Cholecalciferol (VITAMIN D PO) Take 1,000 Units by mouth daily.    . cyclobenzaprine (FLEXERIL) 5 MG tablet Take 1 tablet (5 mg total) by mouth 3 (three) times daily as needed for muscle spasms. 30 tablet 0  . ezetimibe (ZETIA) 10 MG tablet Take 1 tablet (10 mg total) by mouth daily. (Patient taking differently: Take 5 mg by mouth daily. For cholesterol) 30 tablet 3  . FLUZONE HIGH-DOSE 0.5 ML SUSY INJECT 1 DOSE AS DIRECTED.  0  . furosemide (LASIX) 20 MG tablet Take 1 tablet (20 mg total) by mouth every other day as needed for fluid or edema. 30 tablet 0  . gabapentin (NEURONTIN) 300 MG capsule Take 1 capsule (300 mg total) by mouth 3 (three) times daily. 90 capsule 3  . hydrochlorothiazide (HYDRODIURIL) 12.5 MG tablet Take 12.5 mg by mouth daily.    . metFORMIN (GLUCOPHAGE) 500 MG tablet Take 500 mg by mouth daily.    . metoprolol succinate (TOPROL XL) 25 MG 24 hr tablet Take 0.5 tablets (12.5 mg total) by mouth daily. (Patient taking differently: Take 25 mg by mouth 2 (two) times  daily. ) 90 tablet 3  . Multiple Vitamins-Minerals (CENTRUM SILVER PO) Take 1 tablet by mouth daily.    Marland Kitchen olmesartan (BENICAR) 40 MG tablet Take 1 tablet (40 mg total) by mouth daily. 30 tablet 3  . Omega-3 Fatty Acids (FISH OIL PO) Take 1,200 mg by mouth daily.    . pravastatin (PRAVACHOL) 20 MG tablet Take 1 tablet (20 mg total) by mouth daily. 30 tablet 0  . PREVNAR 13 SUSP injection TO BE ADMINISTERED BY PHARMACIST FOR IMMUNIZATION  0  . traMADol (ULTRAM) 50 MG tablet Take 1 tablet (50 mg total) by mouth every 6 (six) hours as needed. (Patient taking differently: Take 50 mg by mouth every 6 (six) hours as needed for moderate pain or severe pain. ) 50 tablet 4   No current facility-administered medications for this visit.     No Known Allergies  Past Medical History:  Diagnosis Date  . Cancer ([place])   . Diabetes mellitus   . Fibromyalgia   . Hyperlipidemia   . Hypertension   . Lymphoma malignant, large cell ([place])   . Neuropathy   . Stroke ([place])   . Urinary tract infection 09/19/11   Being treated at this time Cipro    Social History   Socioeconomic History  . Marital status: Married    Spouse name: [name]   .  Number of children: 6  . Years of education: 48  . Highest education level: Not on file  Occupational History  . Occupation: Retired   Scientific laboratory technician  . Financial resource strain: Not on file  . Food insecurity:    Worry: Not on file    Inability: Not on file  . Transportation needs:    Medical: Not on file    Non-medical: Not on file  Tobacco Use  . Smoking status: Never Smoker  . Smokeless tobacco: Never Used  Substance and Sexual Activity  . Alcohol use: Yes    Alcohol/week: 0.6 oz    Types: 1 Glasses of wine per week    Comment: Rare  . Drug use: No  . Sexual activity: Not on file  Lifestyle  . Physical activity:    Days per week: Not on file    Minutes per session: Not on file  . Stress: Not on file  Relationships  . Social connections:    Talks on  phone: Not on file    Gets together: Not on file    Attends religious service: Not on file    Active member of club or organization: Not on file    Attends meetings of clubs or organizations: Not on file    Relationship status: Not on file  . Intimate partner violence:    Fear of current or ex partner: Not on file    Emotionally abused: Not on file    Physically abused: Not on file    Forced sexual activity: Not on file  Other Topics Concern  . Not on file  Social History Narrative   Lives at home with husband, [name].   Caffeine use: 2-3 cups coffee per day     Family History  Problem Relation Age of Onset  . Heart Problems Brother   . Cancer Sister      Review of Systems: General: negative for chills, fever, night sweats or weight changes.  Cardiovascular: negative for chest pain, dyspnea on exertion, edema, orthopnea, palpitations, paroxysmal nocturnal dyspnea or shortness of breath Dermatological: negative for rash Respiratory: negative for cough or wheezing Urologic: negative for hematuria Abdominal: negative for nausea, vomiting, diarrhea, bright red blood per rectum, melena, or hematemesis Neurologic: negative for visual changes, syncope, or dizziness All other systems reviewed and are otherwise negative except as noted above.    Blood pressure 132/74, pulse 79, height 4\' 11"  (1.499 m), weight 174 lb 12.8 oz (79.3 kg).  General appearance: alert, cooperative, no distress and mildly obese Neck: no carotid bruit and no JVD Lungs: clear to auscultation bilaterally Heart: irregularly irregular rhythm Extremities: extremities normal, atraumatic, no cyanosis or edema Skin: peri orbital and peri oral ecchymosis Neurologic: Grossly normal  EKG AF with VR 70  ASSESSMENT AND PLAN:   PAF (paroxysmal atrial fibrillation) ([name]) Pt with a history of PAF- NSR in Sept 2018, now back in AF with CVR. She is asymptomatic and on chronic anticoagulation. CAHDs VASC=8 (mild vascular  disease on carotid doppler)  History of TIA (transient ischemic attack) I believe this was in 2016- no recurrance  Hypertensive cardiovascular disease Echo in Aug 2016 showed her EF to be 50-55% with grade 3 DD  Dyslipidemia Followed by PCP- her LDL was 48 on a lipid panel this month  Non Hodgkin's lymphoma ([place]) Stable per her history, followed at the cancer center.    PLAN  Ms [name] is back in AF with CVR and asymptomatic. I'm not sure anything needs  to be done about this at this time, I'll review with Dr Stanford Breed. She knows to contact us if she develops fatigue or palpitations, otherwise f/u in 6 months. She brought labs with her from Dr Maudie Mercury done 09/05/17- CMET, lipids, CBC/diff- all reviewed and looked good.   Kerin Ransom PA-C 09/15/2017 10:04 AM

## 2017-09-15 NOTE — Assessment & Plan Note (Signed)
Stable per her history, followed at the cancer center.

## 2017-09-21 ENCOUNTER — Encounter (HOSPITAL_COMMUNITY): Payer: Self-pay | Admitting: Emergency Medicine

## 2017-09-21 DIAGNOSIS — I119 Hypertensive heart disease without heart failure: Secondary | ICD-10-CM | POA: Insufficient documentation

## 2017-09-21 DIAGNOSIS — Z8673 Personal history of transient ischemic attack (TIA), and cerebral infarction without residual deficits: Secondary | ICD-10-CM | POA: Insufficient documentation

## 2017-09-21 DIAGNOSIS — R42 Dizziness and giddiness: Secondary | ICD-10-CM | POA: Diagnosis not present

## 2017-09-21 DIAGNOSIS — Z79899 Other long term (current) drug therapy: Secondary | ICD-10-CM | POA: Insufficient documentation

## 2017-09-21 DIAGNOSIS — Z7984 Long term (current) use of oral hypoglycemic drugs: Secondary | ICD-10-CM | POA: Diagnosis not present

## 2017-09-21 DIAGNOSIS — Z859 Personal history of malignant neoplasm, unspecified: Secondary | ICD-10-CM | POA: Insufficient documentation

## 2017-09-21 DIAGNOSIS — K1379 Other lesions of oral mucosa: Secondary | ICD-10-CM | POA: Diagnosis not present

## 2017-09-21 DIAGNOSIS — Z7901 Long term (current) use of anticoagulants: Secondary | ICD-10-CM | POA: Insufficient documentation

## 2017-09-21 DIAGNOSIS — E119 Type 2 diabetes mellitus without complications: Secondary | ICD-10-CM | POA: Insufficient documentation

## 2017-09-21 NOTE — ED Triage Notes (Signed)
Pt reports having oral surgery on Wednesday, earlier today pt began having bleeding from the incision that has not stopped. Pt noted to have large blood clot on R upper gum area. Pt takes Eliquis

## 2017-09-22 ENCOUNTER — Emergency Department (HOSPITAL_COMMUNITY)
Admission: EM | Admit: 2017-09-22 | Discharge: 2017-09-22 | Disposition: A | Discharge status: Home / Self Care | Payer: Medicare Other | Attending: Emergency Medicine | Admitting: Emergency Medicine

## 2017-09-22 DIAGNOSIS — K1379 Other lesions of oral mucosa: Secondary | ICD-10-CM

## 2017-09-22 LAB — BASIC METABOLIC PANEL
ANION GAP: 10 (ref 5–15)
BUN: 22 mg/dL — ABNORMAL HIGH (ref 6–20)
CALCIUM: 9.4 mg/dL (ref 8.9–10.3)
CO2: 24 mmol/L (ref 22–32)
Chloride: 104 mmol/L (ref 101–111)
Creatinine, Ser: 1.16 mg/dL — ABNORMAL HIGH (ref 0.44–1.00)
GFR, EST AFRICAN AMERICAN: 52 mL/min — AB (ref >60)
GFR, EST NON AFRICAN AMERICAN: 45 mL/min — AB (ref >60)
Glucose, Bld: 113 mg/dL — ABNORMAL HIGH (ref 65–99)
Potassium: 4.2 mmol/L (ref 3.5–5.1)
Sodium: 138 mmol/L (ref 135–145)

## 2017-09-22 LAB — CBC
HEMATOCRIT: 42.9 % (ref 36.0–46.0)
Hemoglobin: 14.1 g/dL (ref 12.0–15.0)
MCH: 31.2 pg (ref 26.0–34.0)
MCHC: 32.9 g/dL (ref 30.0–36.0)
MCV: 94.9 fL (ref 78.0–100.0)
Platelets: 281 10*3/uL (ref 150–400)
RBC: 4.52 MIL/uL (ref 3.87–5.11)
RDW: 14.5 % (ref 11.5–15.5)
WBC: 6.7 10*3/uL (ref 4.0–10.5)

## 2017-09-22 MED ORDER — TRANEXAMIC ACID 1000 MG/10ML IV SOLN
500.0000 mg | Freq: Once | INTRAVENOUS | Status: AC
  Administered 2017-09-22: 500 mg via TOPICAL
  Filled 2017-09-22: qty 10

## 2017-09-22 MED ORDER — ONDANSETRON HCL 4 MG/2ML IJ SOLN
8.0000 mg | Freq: Once | INTRAMUSCULAR | Status: AC
  Administered 2017-09-22: 8 mg via INTRAMUSCULAR
  Filled 2017-09-22: qty 4

## 2017-09-22 MED ORDER — TRANEXAMIC ACID 1000 MG/10ML IV SOLN
500.0000 mg | Freq: Once | INTRAVENOUS | Status: DC
  Filled 2017-09-22: qty 10

## 2017-09-22 NOTE — Discharge Instructions (Addendum)
Do not take your eliquis for two days. Call your surgeon this morning and let him know what happened. Follow a soft diet. No chewing. If bleeding occurs again, bite on several tea bags for 20-30 minutes. If bleeding persists or you have any other problems, return to the ED.

## 2017-09-22 NOTE — ED Provider Notes (Signed)
[facility] EMERGENCY DEPARTMENT Provider Note   CSN: [id number] Arrival date & time: 09/21/17  2348     History   Chief Complaint Chief Complaint  Patient presents with  . Oral Bleeding    HPI [name] is a 77 y.o. female.  Patient reports she had oral surgery 10 days ago including dental extraction with partial denture placement.  Patient states she was wearing her partial denture today on her upper jaw when she began to have bleeding started around 7 PM.  She does take Eliquis for a history of atrial fibrillation.  This was stopped for her surgery but was restarted about a week ago.  Bleeding is coming from her upper gingiva at extraction site.  She believes it might have been cut by the denture.  She denies any chest pain or shortness of breath.  She endorses mild lightheadedness.  The history is provided by the patient and the spouse.    Past Medical History:  Diagnosis Date  . Cancer ([place])   . Diabetes mellitus   . Fibromyalgia   . Hyperlipidemia   . Hypertension   . 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 Neuropathy   . Stroke ([place])   . Urinary tract infection 09/19/11   Being treated at this time Cipro    Patient Active Problem List   Diagnosis Date Noted  . 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 Chronic anticoagulation 04/21/2015  . PAF (paroxysmal atrial fibrillation) ([place]) 02/20/2015  . Essential hypertension 02/20/2015  . 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 Dyslipidemia   . Slurred speech 02/16/2015  . History of TIA (transient ischemic attack) 02/16/2015  . Diabetes ([place]) 02/16/2015  . Atrial flutter ([place]) 02/16/2015  . Acute renal failure ([place]) 02/16/2015  . Hereditary and idiopathic peripheral neuropathy 12/16/2014  . FLANK PAIN, RIGHT 01/19/2010  . Non Hodgkin's lymphoma ([place]) 11/30/2009  . UNSPECIFIED DISORDER OF KIDNEY AND URETER 11/30/2009  . DIARRHEA 11/30/2009    . FEVER, HX OF 11/30/2009    Past Surgical History:  Procedure Laterality Date  . APPENDECTOMY    . CATARACT EXTRACTION  2015  . CHOLECYSTECTOMY    . JOINT REPLACEMENT    . JOINT REPLACEMENT    . ROTATOR CUFF REPAIR       OB History   None      Home Medications    Prior to Admission medications   Medication Sig Start Date End Date Taking? Authorizing Provider  apixaban (ELIQUIS) 5 MG TABS tablet Take 1 tablet (5 mg total) by mouth 2 (two) times daily. 02/18/15   Florencia Reasons, MD  BIOTIN 5000 PO Take 1 tablet by mouth daily.    [provider]  Cholecalciferol (VITAMIN D PO) Take 1,000 Units by mouth daily.    [provider]  cyclobenzaprine (FLEXERIL) 5 MG tablet Take 1 tablet (5 mg total) by mouth 3 (three) times daily as needed for muscle spasms. 04/21/15   Rosalin Hawking, MD  ezetimibe (ZETIA) 10 MG tablet Take 1 tablet (10 mg total) by mouth daily. Patient taking differently: Take 5 mg by mouth daily. For cholesterol 02/18/15   Florencia Reasons, MD  FLUZONE HIGH-DOSE 0.5 ML SUSY INJECT 1 DOSE AS DIRECTED. 03/14/15   [provider]  furosemide (LASIX) 20 MG tablet Take 1 tablet (20 mg total) by mouth every other day as needed for fluid or edema. 02/18/15   Florencia Reasons, MD  gabapentin (  NEURONTIN) 300 MG capsule Take 1 capsule (300 mg total) by mouth 3 (three) times daily. 02/08/16   Melvenia Beam, MD  hydrochlorothiazide (HYDRODIURIL) 12.5 MG tablet Take 12.5 mg by mouth daily. 09/17/15   [provider]  metFORMIN (GLUCOPHAGE) 500 MG tablet Take 500 mg by mouth daily. 12/10/14   [provider]  metoprolol succinate (TOPROL XL) 25 MG 24 hr tablet Take 0.5 tablets (12.5 mg total) by mouth daily. Patient taking differently: Take 25 mg by mouth 2 (two) times daily.  02/20/15   Lelon Perla, MD  Multiple Vitamins-Minerals (CENTRUM SILVER PO) Take 1 tablet by mouth daily.    [provider]  olmesartan (BENICAR) 40 MG tablet Take 1 tablet (40  mg total) by mouth daily. 02/18/15   Florencia Reasons, MD  Omega-3 Fatty Acids (FISH OIL PO) Take 1,200 mg by mouth daily.    [provider]  pravastatin (PRAVACHOL) 20 MG tablet Take 1 tablet (20 mg total) by mouth daily. 02/18/15   Florencia Reasons, MD  PREVNAR 13 SUSP injection TO BE ADMINISTERED BY PHARMACIST FOR IMMUNIZATION 03/14/15   [provider]  traMADol (ULTRAM) 50 MG tablet Take 1 tablet (50 mg total) by mouth every 6 (six) hours as needed. Patient taking differently: Take 50 mg by mouth every 6 (six) hours as needed for moderate pain or severe pain.  01/07/15   Melvenia Beam, MD    Family History Family History  Problem Relation Age of Onset  . Heart Problems Brother   . Cancer Sister     Social History Social History   Tobacco Use  . Smoking status: Never Smoker  . Smokeless tobacco: Never Used  Substance Use Topics  . Alcohol use: Yes    Alcohol/week: 0.6 oz    Types: 1 Glasses of wine per week    Comment: Rare  . Drug use: No     Allergies   Patient has no known allergies.   Review of Systems Review of Systems  Constitutional: Negative for activity change, appetite change and fever.  HENT: Positive for dental problem.   Eyes: Negative for visual disturbance.  Respiratory: Negative for chest tightness and shortness of breath.   Cardiovascular: Negative for chest pain.  Gastrointestinal: Negative for abdominal pain, nausea and vomiting.  Genitourinary: Negative for dysuria and hematuria.  Musculoskeletal: Negative for back pain and myalgias.  Neurological: Positive for light-headedness. Negative for dizziness, weakness and headaches.   all other systems are negative except as noted in the HPI and PMH.     Physical Exam Updated Vital Signs BP (!) 167/116   Pulse 90   Temp 98.7 F (37.1 C) (Oral)   Resp 16   Ht 4\' 11"  (1.499 m)   Wt 79.4 kg (175 lb)   SpO2 99%   BMI 35.35 kg/m   Physical Exam  Constitutional: She is oriented to person,  place, and time. She appears well-developed and well-nourished. No distress.  HENT:  Head: Normocephalic and atraumatic.  Mouth/Throat: Oropharynx is clear and moist. No oropharyngeal exudate.  Large blood clot along tongue and roof of mouth.  This was removed.  There is oozing bleeding from upper gingiva at site of tooth extraction.  Eyes: Pupils are equal, round, and reactive to light. Conjunctivae and EOM are normal.  Neck: Normal range of motion. Neck supple.  No meningismus.  Cardiovascular: Normal rate, normal heart sounds and intact distal pulses.  No murmur heard. Irregular rhythm  Pulmonary/Chest: Effort normal  and breath sounds normal. No respiratory distress.  Abdominal: Soft. There is no tenderness. There is no rebound and no guarding.  Musculoskeletal: Normal range of motion. She exhibits no edema or tenderness.  Neurological: She is alert and oriented to person, place, and time. No cranial nerve deficit. She exhibits normal muscle tone. Coordination normal.   5/5 strength throughout. CN 2-12 intact.Equal grip strength.   Skin: Skin is warm.  Psychiatric: She has a normal mood and affect. Her behavior is normal.  Nursing note and vitals reviewed.    ED Treatments / Results  Labs (all labs ordered are listed, but only abnormal results are displayed) Labs Reviewed  BASIC METABOLIC PANEL - Abnormal; Notable for the following components:      Result Value   Glucose, Bld 113 (*)    BUN 22 (*)    Creatinine, Ser 1.16 (*)    GFR calc non Af Amer 45 (*)    GFR calc Af Amer 52 (*)    All other components within normal limits  CBC    EKG None  Radiology No results found.  Procedures Procedures (including critical care time)  Medications Ordered in ED Medications  tranexamic acid (CYKLOKAPRON) injection 500 mg (has no administration in time range)     Initial Impression / Assessment and Plan / ED Course  I have reviewed the triage vital signs and the nursing  notes.  Pertinent labs & imaging results that were available during my care of the patient were reviewed by me and considered in my medical decision making (see chart for details).    Patient with persistent oozing at oral surgery site after she states that her appliance damaged surgical area.  She is on Eliquis.  Her vitals are stable.  She is hypertensive.  She denies chest pain or shortness of breath.  Bleeding controlled with teabags, topical TXA.  Patient checked multiple times throughout ED stay.  She did have a separate area that began bleeding after initial area was controlled.  This was also treated with TXA.  Patient had no further bleeding on reassessment.  She amatory to the bathroom without problem.  Advised to call her surgeon today to let him know what is going on.  Follow soft diet.  Hold Eliquis for 2 days.  Return precautions discussed.    CRITICAL CARE Performed by: Ezequiel Essex Total critical care time: 31 minutes Critical care time was exclusive of separately billable procedures and treating other patients. Critical care was necessary to treat or prevent imminent or life-threatening deterioration. Critical care was time spent personally by me on the following activities: development of treatment plan with patient and/or surrogate as well as nursing, discussions with consultants, evaluation of patient's response to treatment, examination of patient, obtaining history from patient or surrogate, ordering and performing treatments and interventions, ordering and review of laboratory studies, ordering and review of radiographic studies, pulse oximetry and re-evaluation of patient's condition.   Final Clinical Impressions(s) / ED Diagnoses   Final diagnoses:  Oral bleeding    ED Discharge Orders    None       Nancy Arvin, Annie Main, MD 09/22/17 [phone]

## 2017-10-27 DIAGNOSIS — M47816 Spondylosis without myelopathy or radiculopathy, lumbar region: Secondary | ICD-10-CM | POA: Insufficient documentation

## 2017-12-15 ENCOUNTER — Other Ambulatory Visit (HOSPITAL_BASED_OUTPATIENT_CLINIC_OR_DEPARTMENT_OTHER): Payer: Self-pay | Admitting: Internal Medicine

## 2017-12-15 DIAGNOSIS — Z1231 Encounter for screening mammogram for malignant neoplasm of breast: Secondary | ICD-10-CM

## 2017-12-19 ENCOUNTER — Ambulatory Visit (HOSPITAL_BASED_OUTPATIENT_CLINIC_OR_DEPARTMENT_OTHER)
Admission: RE | Admit: 2017-12-19 | Discharge: 2017-12-19 | Disposition: A | Discharge status: Home / Self Care | Payer: Medicare Other | Source: Ambulatory Visit | Attending: Internal Medicine | Admitting: Internal Medicine

## 2017-12-19 DIAGNOSIS — Z1231 Encounter for screening mammogram for malignant neoplasm of breast: Secondary | ICD-10-CM | POA: Insufficient documentation

## 2018-02-08 DIAGNOSIS — G5603 Carpal tunnel syndrome, bilateral upper limbs: Secondary | ICD-10-CM | POA: Insufficient documentation

## 2018-02-13 ENCOUNTER — Encounter: Payer: Self-pay | Admitting: Cardiology

## 2018-02-14 ENCOUNTER — Telehealth: Payer: Self-pay

## 2018-02-14 NOTE — Telephone Encounter (Signed)
   [facility] Pre-operative Risk Assessment    Request for surgical clearance:  1. What type of surgery is being performed? BILATERAL CARPAL TUNNEL RELEASE   2. When is this surgery scheduled? 04-18-18 _0    3. What type of clearance is required (medical clearance vs. Pharmacy clearance to hold med vs. Both)? BOTH  4. Are there any medications that need to be held prior to surgery and how long? ELIQUIS   5. Practice name and name of physician performing surgery?   [facility]  6. What is your office phone number [phone]    7.   What is your office fax number [phone]  8.   Anesthesia type (None, local, MAC, general) ? GENERAL W/SEDATION   [name] 02/14/2018, 5:03 PM  _________________________________________________________________   (provider comments below)

## 2018-02-15 NOTE — Telephone Encounter (Signed)
   Patient is to have bilateral carpal tunnel surgery performed on 04/18/2018.  She has an appointment with Dr. Percival Spanish in September.  I spoke with her on the phone, she will discuss cardiac clearance for the procedure and holding Eliquis at that time.  Rosaria Ferries, PA-C 02/15/2018 4:35 PM [place] [phone]

## 2018-02-20 NOTE — Telephone Encounter (Signed)
   I will route this recommendation to the requesting party via Epic fax function and remove from pre-op pool.  Please call with questions.  [place], [place] 02/20/2018, 1:39 PM

## 2018-03-05 ENCOUNTER — Encounter: Payer: Self-pay | Admitting: Cardiology

## 2018-03-05 NOTE — Progress Notes (Signed)
Cardiology Office Note   Date:  03/06/2018   ID:  [name], DOB 12-15-1940, MRN [id number]  PCP:  [name], MD  Cardiologist:   No primary care provider on file. Referring:    Chief Complaint  Patient presents with  . Pre-op Exam      History of Present Illness: [name] is a 77 y.o. female who presents for preop evaluation prior to carpal tunnel surgery.  She sees Dr. Stanford Breed.  She has a history of paroxysmal atrial fibrillation but no cardiac disease.  She is quite active.  She does water aerobics.  With this she denies any cardiovascular symptoms. The patient denies any new symptoms such as chest discomfort, neck or arm discomfort. There has been no new shortness of breath, PND or orthopnea. There have been no reported palpitations, presyncope or syncope.  She does occasionally feel palpitations but she does not think they are particularly symptomatic.  Of note she had an episode of syncope 1 week ago.  She said she was having trouble sleeping which happens.  She went into the kitchen.  She took some Ambien prior to this and then got up to read.  She remembers closing the kitchen blind but then she had apparently a frank syncopal episode and found herself on the floor the kitchen with some trauma to her hand.  She had no speech motor or other deficits.  She felt somewhat foggy after this but was well by the next day.  She does have occasional mild orthostatic symptoms but she does not have other episodes of presyncope or syncope.  She was not feeling any palpitations.   Past Medical History:  Diagnosis Date  . Cancer ([place])   . Diabetes mellitus   . Fibromyalgia   . Hyperlipidemia   . Hypertension   . 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 Neuropathy   . PAF (paroxysmal atrial fibrillation) ([place])   . Stroke ([place])   . Urinary tract infection 09/19/11   Being treated at this time Cipro    Past Surgical History:  Procedure Laterality Date  .  APPENDECTOMY    . CATARACT EXTRACTION  2015  . CHOLECYSTECTOMY    . JOINT REPLACEMENT    . JOINT REPLACEMENT    . ROTATOR CUFF REPAIR       Current Outpatient Medications  Medication Sig Dispense Refill  . apixaban (ELIQUIS) 5 MG TABS tablet Take 1 tablet (5 mg total) by mouth 2 (two) times daily. 60 tablet 3  . BIOTIN 5000 PO Take 1 tablet by mouth daily.    . Cholecalciferol (VITAMIN D PO) Take 1,000 Units by mouth daily.    Marland Kitchen ezetimibe (ZETIA) 10 MG tablet Take 1 tablet (10 mg total) by mouth daily. (Patient taking differently: Take 5 mg by mouth daily. For cholesterol) 30 tablet 3  . furosemide (LASIX) 20 MG tablet Take 1 tablet (20 mg total) by mouth every other day as needed for fluid or edema. 30 tablet 0  . gabapentin (NEURONTIN) 300 MG capsule Take 1 capsule (300 mg total) by mouth 3 (three) times daily. 90 capsule 3  . hydrochlorothiazide (HYDRODIURIL) 12.5 MG tablet Take 12.5 mg by mouth daily.    . metFORMIN (GLUCOPHAGE) 500 MG tablet Take 500 mg by mouth daily.    . metoprolol succinate (TOPROL XL) 25 MG 24 hr tablet Take 0.5 tablets (12.5 mg total) by mouth daily. (Patient taking differently: Take 25 mg by mouth 2 (two)  times daily. ) 90 tablet 3  . Multiple Vitamins-Minerals (CENTRUM SILVER PO) Take 1 tablet by mouth daily.    Marland Kitchen olmesartan (BENICAR) 40 MG tablet Take 1 tablet (40 mg total) by mouth daily. 30 tablet 3  . Omega-3 Fatty Acids (FISH OIL PO) Take 1,200 mg by mouth daily.    . pravastatin (PRAVACHOL) 20 MG tablet Take 1 tablet (20 mg total) by mouth daily. 30 tablet 0  . traMADol (ULTRAM) 50 MG tablet Take 1 tablet (50 mg total) by mouth every 6 (six) hours as needed. (Patient taking differently: Take 50 mg by mouth every 6 (six) hours as needed for moderate pain or severe pain. ) 50 tablet 4   No current facility-administered medications for this visit.     Allergies:   Patient has no known allergies.    ROS:  Please see the history of present illness.    Otherwise, review of systems are positive for nighttime leg cramping.   All other systems are reviewed and negative.    PHYSICAL EXAM: VS:  BP 128/81   Pulse 66   Ht 4\' 11"  (1.499 m)   Wt 168 lb 6.4 oz (76.4 kg)   BMI 34.01 kg/m  , BMI Body mass index is 34.01 kg/m. GENERAL:  Well appearing NECK:  No jugular venous distention, waveform within normal limits, carotid upstroke brisk and symmetric, no bruits, no thyromegaly LUNGS:  Clear to auscultation bilaterally CHEST:  Unremarkable HEART:  PMI not displaced or sustained,S1 and S2 within normal limits, no S3, no S4, no clicks, no rubs, no murmurs ABD:  Flat, positive bowel sounds normal in frequency in pitch, no bruits, no rebound, no guarding, no midline pulsatile mass, no hepatomegaly, no splenomegaly EXT:  2 plus pulses throughout, no edema, no cyanosis no clubbing   EKG:  EKG is ordered today. The ekg ordered today demonstrates sinus rhythm, inferolateral T wave inversions present on previous EKGs, first-degree AV block.   Recent Labs: 09/22/2017: BUN 22; Creatinine, Ser 1.16; Hemoglobin 14.1; Platelets 281; Potassium 4.2; Sodium 138    Lipid Panel    Component Value Date/Time   CHOL 182 02/17/2015 0535   TRIG 61 02/17/2015 0535   HDL 50 02/17/2015 0535   CHOLHDL 3.6 02/17/2015 0535   VLDL 12 02/17/2015 0535   LDLCALC 120 (H) 02/17/2015 0535      Wt Readings from Last 3 Encounters:  03/06/18 168 lb 6.4 oz (76.4 kg)  09/21/17 175 lb (79.4 kg)  09/15/17 174 lb 12.8 oz (79.3 kg)      Other studies Reviewed: Additional studies/ records that were reviewed today include: None. Review of the above records demonstrates:  Please see elsewhere in the note.     ASSESSMENT AND PLAN:  PREOP:   The patient has a high functional level.  She is not going for high risk cardiovascular study.  She has no untreated signs or symptoms.  Therefore, according to ACC/AHA guidelines patient is at acceptable risk for the planned surgery.   She would hold her Eliquis 2 days prior to the procedure and resume as soon as felt safe according to her surgeon.  PAF: She seems to be in sinus rhythm.  She has no symptomatic significant paroxysms.  Ms. NOMA QUIJAS has a CHA2DS2 - VASc score of 8.   She will hold her med as above.   HTN:  The blood pressure is at target. No change in medications is indicated. We will continue with therapeutic lifestyle changes (  TLC).  ABNORMAL EKG: The patient does have inferolateral T wave inversions.  However, I reviewed multiple previous EKGs.  This has been long-standing and seen on previous EKGs and there is no active evidence of ischemia.  No further work-up is suggested.   SYNCOPE: She was not orthostatic in the office.  I suspect this was related to her Ambien and since it was an isolated episode following her taking a medicine that she is not used to I do not think that further monitoring would be indicated.    Current medicines are reviewed at length with the patient today.  The patient does not have concerns regarding medicines.  The following changes have been made:  no change  Labs/ tests ordered today include: None No orders of the defined types were placed in this encounter.    Disposition:   FU with Dr. Stanford Breed     Signed, Minus Breeding, MD  03/06/2018 2:23 PM    [place]

## 2018-03-06 ENCOUNTER — Encounter: Payer: Self-pay | Admitting: Cardiology

## 2018-03-06 ENCOUNTER — Ambulatory Visit (INDEPENDENT_AMBULATORY_CARE_PROVIDER_SITE_OTHER): Payer: Medicare Other | Admitting: Cardiology

## 2018-03-06 VITALS — BP 128/81 | HR 66 | Ht 59.0 in | Wt 168.4 lb

## 2018-03-06 DIAGNOSIS — R55 Syncope and collapse: Secondary | ICD-10-CM

## 2018-03-06 DIAGNOSIS — Z01810 Encounter for preprocedural cardiovascular examination: Secondary | ICD-10-CM | POA: Insufficient documentation

## 2018-03-06 DIAGNOSIS — R9431 Abnormal electrocardiogram [ECG] [EKG]: Secondary | ICD-10-CM

## 2018-03-06 DIAGNOSIS — I10 Essential (primary) hypertension: Secondary | ICD-10-CM | POA: Diagnosis not present

## 2018-03-06 DIAGNOSIS — I480 Paroxysmal atrial fibrillation: Secondary | ICD-10-CM

## 2018-03-06 HISTORY — DX: Syncope and collapse: R55

## 2018-03-06 NOTE — Patient Instructions (Signed)
Medication Instructions:  Continue current medications  If you need a refill on your cardiac medications before your next appointment, please call your pharmacy.  Labwork: None Ordered   Testing/Procedures: None Ordered   Follow-Up: Your physician wants you to follow-up in: 6 Months with Dr Stanford Breed. You should receive a reminder letter in the mail two months in advance. If you do not receive a letter, please call our office in [phone] to schedule your follow-up appointment.     Thank you for choosing [facility] at [facility]!!

## 2018-03-23 NOTE — Addendum Note (Signed)
Addended by: Leland Johns A on: 03/23/2018 04:32 PM   Modules accepted: Orders

## 2018-07-16 ENCOUNTER — Encounter (HOSPITAL_BASED_OUTPATIENT_CLINIC_OR_DEPARTMENT_OTHER): Payer: Self-pay | Admitting: *Deleted

## 2018-07-16 ENCOUNTER — Other Ambulatory Visit: Payer: Self-pay

## 2018-07-16 ENCOUNTER — Emergency Department (HOSPITAL_BASED_OUTPATIENT_CLINIC_OR_DEPARTMENT_OTHER)
Admission: EM | Admit: 2018-07-16 | Discharge: 2018-07-16 | Disposition: A | Discharge status: Home / Self Care | Payer: Medicare Other | Attending: Emergency Medicine | Admitting: Emergency Medicine

## 2018-07-16 DIAGNOSIS — E119 Type 2 diabetes mellitus without complications: Secondary | ICD-10-CM | POA: Diagnosis not present

## 2018-07-16 DIAGNOSIS — Z7901 Long term (current) use of anticoagulants: Secondary | ICD-10-CM | POA: Diagnosis not present

## 2018-07-16 DIAGNOSIS — L600 Ingrowing nail: Secondary | ICD-10-CM | POA: Diagnosis not present

## 2018-07-16 DIAGNOSIS — Z7902 Long term (current) use of antithrombotics/antiplatelets: Secondary | ICD-10-CM | POA: Diagnosis not present

## 2018-07-16 DIAGNOSIS — Z7984 Long term (current) use of oral hypoglycemic drugs: Secondary | ICD-10-CM | POA: Diagnosis not present

## 2018-07-16 DIAGNOSIS — C8590 Non-Hodgkin lymphoma, unspecified, unspecified site: Secondary | ICD-10-CM | POA: Insufficient documentation

## 2018-07-16 DIAGNOSIS — I10 Essential (primary) hypertension: Secondary | ICD-10-CM | POA: Insufficient documentation

## 2018-07-16 DIAGNOSIS — Z79899 Other long term (current) drug therapy: Secondary | ICD-10-CM | POA: Diagnosis not present

## 2018-07-16 DIAGNOSIS — M79674 Pain in right toe(s): Secondary | ICD-10-CM | POA: Diagnosis present

## 2018-07-16 MED ORDER — CEPHALEXIN 500 MG PO CAPS: 500.0000 mg | ORAL_CAPSULE | Freq: Three times a day (TID) | ORAL | 0 refills | Status: DC

## 2018-07-16 MED FILL — CEPHALEXIN 500 MG CAPSULE: 500 | 9 days supply | Qty: 28 | Fill #0

## 2018-07-16 NOTE — ED Triage Notes (Signed)
Pain in her right great toe. She feels it is infected. She is diabetic.

## 2018-07-16 NOTE — ED Provider Notes (Signed)
[facility] EMERGENCY DEPARTMENT Provider Note   CSN: [id number] Arrival date & time: 07/16/18  1116     History   Chief Complaint Chief Complaint  Patient presents with  . Toe Pain    HPI [name] is a 78 y.o. female.  Patient is a 78 year old female who presents with an toe pain.  She states that her toe is been bothering her for the last 2 to 3 days.  She denies any drainage.  She is noticed some redness and swelling around the toe.  She does have a podiatrist but was not able to get into see the podiatrist today.  She denies any known fevers.     Past Medical History:  Diagnosis Date  . Cancer ([place])   . Diabetes mellitus   . Fibromyalgia   . Hyperlipidemia   . Hypertension   . 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 Neuropathy   . PAF (paroxysmal atrial fibrillation) ([place])   . Stroke ([place])   . Urinary tract infection 09/19/11   Being treated at this time Cipro    Patient Active Problem List   Diagnosis Date Noted  . Abnormal EKG 03/06/2018  . Syncope 03/06/2018  . Preop cardiovascular exam 03/06/2018  . 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 Chronic anticoagulation 04/21/2015  . PAF (paroxysmal atrial fibrillation) ([place]) 02/20/2015  . Essential hypertension 02/20/2015  . 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 Dyslipidemia   . Slurred speech 02/16/2015  . History of TIA (transient ischemic attack) 02/16/2015  . Diabetes ([place]) 02/16/2015  . Atrial flutter ([place]) 02/16/2015  . Acute renal failure ([place]) 02/16/2015  . Hereditary and idiopathic peripheral neuropathy 12/16/2014  . FLANK PAIN, RIGHT 01/19/2010  . Non Hodgkin's lymphoma ([place]) 11/30/2009  . UNSPECIFIED DISORDER OF KIDNEY AND URETER 11/30/2009  . DIARRHEA 11/30/2009  . FEVER, HX OF 11/30/2009    Past Surgical History:  Procedure Laterality Date  . APPENDECTOMY    . CATARACT EXTRACTION  2015  . CHOLECYSTECTOMY    . JOINT  REPLACEMENT    . JOINT REPLACEMENT    . ROTATOR CUFF REPAIR       OB History   No obstetric history on file.      Home Medications    Prior to Admission medications   Medication Sig Start Date End Date Taking? Authorizing Provider  apixaban (ELIQUIS) 5 MG TABS tablet Take 1 tablet (5 mg total) by mouth 2 (two) times daily. 02/18/15   Florencia Reasons, MD  BIOTIN 5000 PO Take 1 tablet by mouth daily.    [provider]  cephALEXin (KEFLEX) 500 MG capsule Take 1 capsule (500 mg total) by mouth 3 (three) times daily. 07/16/18   Malvin Johns, MD  Cholecalciferol (VITAMIN D PO) Take 1,000 Units by mouth daily.    [provider]  ezetimibe (ZETIA) 10 MG tablet Take 1 tablet (10 mg total) by mouth daily. Patient taking differently: Take 5 mg by mouth daily. For cholesterol 02/18/15   Florencia Reasons, MD  furosemide (LASIX) 20 MG tablet Take 1 tablet (20 mg total) by mouth every other day as needed for fluid or edema. 02/18/15   Florencia Reasons, MD  gabapentin (NEURONTIN) 300 MG capsule Take 1 capsule (300 mg total) by mouth 3 (three) times daily. 02/08/16   Melvenia Beam, MD  hydrochlorothiazide (HYDRODIURIL) 12.5 MG tablet Take 12.5 mg by mouth daily. 09/17/15   [provider]  metFORMIN (GLUCOPHAGE) 500 MG tablet Take 500 mg by mouth daily. 12/10/14   [provider]  metoprolol succinate (TOPROL XL) 25 MG 24 hr tablet Take 0.5 tablets (12.5 mg total) by mouth daily. Patient taking differently: Take 25 mg by mouth 2 (two) times daily.  02/20/15   Lelon Perla, MD  Multiple Vitamins-Minerals (CENTRUM SILVER PO) Take 1 tablet by mouth daily.    [provider]  olmesartan (BENICAR) 40 MG tablet Take 1 tablet (40 mg total) by mouth daily. 02/18/15   Florencia Reasons, MD  Omega-3 Fatty Acids (FISH OIL PO) Take 1,200 mg by mouth daily.    [provider]  pravastatin (PRAVACHOL) 20 MG tablet Take 1 tablet (20 mg total) by mouth daily. 02/18/15   Florencia Reasons, MD  traMADol  (ULTRAM) 50 MG tablet Take 1 tablet (50 mg total) by mouth every 6 (six) hours as needed. Patient taking differently: Take 50 mg by mouth every 6 (six) hours as needed for moderate pain or severe pain.  01/07/15   Melvenia Beam, MD    Family History Family History  Problem Relation Age of Onset  . Heart Problems Brother   . Cancer Sister     Social History Social History   Tobacco Use  . Smoking status: Never Smoker  . Smokeless tobacco: Never Used  Substance Use Topics  . Alcohol use: Yes    Alcohol/week: 1.0 standard drinks    Types: 1 Glasses of wine per week    Comment: Rare  . Drug use: No     Allergies   Patient has no known allergies.   Review of Systems Review of Systems  Constitutional: Negative for fever.  Gastrointestinal: Negative for nausea and vomiting.  Musculoskeletal: Positive for arthralgias and joint swelling. Negative for back pain and neck pain.  Skin: Negative for wound.  Neurological: Negative for weakness, numbness and headaches.     Physical Exam Updated Vital Signs BP (!) 162/111   Pulse (!) 57   Temp 99.2 F (37.3 C) (Oral)   Resp 18   Ht 4\' 11"  (1.499 m)   Wt 76.2 kg   SpO2 99%   BMI 33.93 kg/m   Physical Exam Constitutional:      Appearance: She is well-developed.  HENT:     Head: Normocephalic and atraumatic.  Neck:     Musculoskeletal: Normal range of motion and neck supple.  Cardiovascular:     Rate and Rhythm: Normal rate.  Pulmonary:     Effort: Pulmonary effort is normal.  Musculoskeletal:        General: Tenderness present.     Comments: Patient has some minor swelling and redness to the medial aspect of the right big toe.  Appears to be an ingrown toenail.  There is no fluctuance.  No drainage.  No extension of the redness past the toe.  Capillary refill is less than 2 distally.  Skin:    General: Skin is warm and dry.  Neurological:     Mental Status: She is alert and oriented to person, place, and time.       ED Treatments / Results  Labs (all labs ordered are listed, but only abnormal results are displayed) Labs Reviewed - No data to display  EKG None  Radiology No results found.  Procedures Procedures (including critical care time)  Medications Ordered in ED Medications - No data to display   Initial Impression / Assessment and Plan / ED Course  I  have reviewed the triage vital signs and the nursing notes.  Pertinent labs & imaging results that were available during my care of the patient were reviewed by me and considered in my medical decision making (see chart for details).     Patient presents with a ingrown toenail.  There is some minor surrounding erythema.  Given that she is a diabetic, I will start her on Keflex and I feel that it is prudent to try conservative measures prior to removing the toenail.  I advised her to use warm soaks several times a day with Epson salts.  I advised her to follow-up with her podiatrist.  Return precautions were given.  Her blood pressure is elevated.  It will be rechecked prior to discharge and is still elevated she will be advised to follow-up with her PCP to recheck this.  Final Clinical Impressions(s) / ED Diagnoses   Final diagnoses:  Ingrown toenail    ED Discharge Orders         Ordered    cephALEXin (KEFLEX) 500 MG capsule  3 times daily     07/16/18 1139           Malvin Johns, MD 07/16/18 1142

## 2018-08-17 ENCOUNTER — Other Ambulatory Visit: Payer: Self-pay

## 2018-08-17 ENCOUNTER — Emergency Department (HOSPITAL_BASED_OUTPATIENT_CLINIC_OR_DEPARTMENT_OTHER)
Admission: EM | Admit: 2018-08-17 | Discharge: 2018-08-17 | Disposition: A | Discharge status: Home / Self Care | Payer: Medicare Other | Attending: Emergency Medicine | Admitting: Emergency Medicine

## 2018-08-17 ENCOUNTER — Encounter (HOSPITAL_BASED_OUTPATIENT_CLINIC_OR_DEPARTMENT_OTHER): Payer: Self-pay | Admitting: Emergency Medicine

## 2018-08-17 DIAGNOSIS — I10 Essential (primary) hypertension: Secondary | ICD-10-CM | POA: Insufficient documentation

## 2018-08-17 DIAGNOSIS — Z7984 Long term (current) use of oral hypoglycemic drugs: Secondary | ICD-10-CM | POA: Insufficient documentation

## 2018-08-17 DIAGNOSIS — Z79899 Other long term (current) drug therapy: Secondary | ICD-10-CM | POA: Insufficient documentation

## 2018-08-17 DIAGNOSIS — Z8572 Personal history of non-Hodgkin lymphomas: Secondary | ICD-10-CM | POA: Diagnosis not present

## 2018-08-17 DIAGNOSIS — E1140 Type 2 diabetes mellitus with diabetic neuropathy, unspecified: Secondary | ICD-10-CM | POA: Insufficient documentation

## 2018-08-17 DIAGNOSIS — R040 Epistaxis: Secondary | ICD-10-CM | POA: Diagnosis not present

## 2018-08-17 DIAGNOSIS — Z8673 Personal history of transient ischemic attack (TIA), and cerebral infarction without residual deficits: Secondary | ICD-10-CM | POA: Diagnosis not present

## 2018-08-17 MED ORDER — LIDOCAINE-EPINEPHRINE (PF) 2 %-1:200000 IJ SOLN
INTRAMUSCULAR | Status: AC
  Administered 2018-08-17: 20:00:00
  Filled 2018-08-17: qty 10

## 2018-08-17 MED ORDER — OXYMETAZOLINE HCL 0.05 % NA SOLN
1.0000 | Freq: Once | NASAL | Status: AC
  Administered 2018-08-17: 1 via NASAL
  Filled 2018-08-17: qty 30

## 2018-08-17 MED ORDER — LIDOCAINE-EPINEPHRINE 2 %-1:100000 IJ SOLN
20.0000 mL | Freq: Once | INTRAMUSCULAR | Status: DC
  Filled 2018-08-17: qty 20

## 2018-08-17 MED ORDER — SILVER NITRATE-POT NITRATE 75-25 % EX MISC
CUTANEOUS | Status: AC
  Filled 2018-08-17: qty 1

## 2018-08-17 NOTE — ED Provider Notes (Signed)
[facility] EMERGENCY DEPARTMENT Provider Note   CSN: [id number] Arrival date & time: 08/17/18  1823    History   Chief Complaint Chief Complaint  Patient presents with  . Epistaxis    HPI [name] is a 78 y.o. female.     78 yo F with a chief complaint of a nosebleed.  This started a few hours ago.  She has tried pressure without improvement.  She denies prior history of nosebleed she denies trauma to the nose.  She is on Eliquis for A. fib.  The history is provided by the patient.  Epistaxis  Location:  R nare Severity:  Mild Duration:  2 hours Timing:  Constant Progression:  Unchanged Chronicity:  New Relieved by:  Nothing Worsened by:  Nothing Ineffective treatments:  None tried Associated symptoms: no congestion, no dizziness, no fever and no headaches     Past Medical History:  Diagnosis Date  . Cancer ([place])   . Diabetes mellitus   . Fibromyalgia   . Hyperlipidemia   . Hypertension   . 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 Neuropathy   . PAF (paroxysmal atrial fibrillation) ([place])   . Stroke ([place])   . Urinary tract infection 09/19/11   Being treated at this time Cipro    Patient Active Problem List   Diagnosis Date Noted  . Abnormal EKG 03/06/2018  . Syncope 03/06/2018  . Preop cardiovascular exam 03/06/2018  . 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 Chronic anticoagulation 04/21/2015  . PAF (paroxysmal atrial fibrillation) ([place]) 02/20/2015  . Essential hypertension 02/20/2015  . 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 Dyslipidemia   . Slurred speech 02/16/2015  . History of TIA (transient ischemic attack) 02/16/2015  . Diabetes ([place]) 02/16/2015  . Atrial flutter ([place]) 02/16/2015  . Acute renal failure ([place]) 02/16/2015  . Hereditary and idiopathic peripheral neuropathy 12/16/2014  . FLANK PAIN, RIGHT 01/19/2010  . Non Hodgkin's lymphoma ([place]) 11/30/2009  . UNSPECIFIED  DISORDER OF KIDNEY AND URETER 11/30/2009  . DIARRHEA 11/30/2009  . FEVER, HX OF 11/30/2009    Past Surgical History:  Procedure Laterality Date  . APPENDECTOMY    . CATARACT EXTRACTION  2015  . CHOLECYSTECTOMY    . JOINT REPLACEMENT    . JOINT REPLACEMENT    . ROTATOR CUFF REPAIR       OB History   No obstetric history on file.      Home Medications    Prior to Admission medications   Medication Sig Start Date End Date Taking? Authorizing Provider  apixaban (ELIQUIS) 5 MG TABS tablet Take 1 tablet (5 mg total) by mouth 2 (two) times daily. 02/18/15   Florencia Reasons, MD  BIOTIN 5000 PO Take 1 tablet by mouth daily.    [provider]  cephALEXin (KEFLEX) 500 MG capsule Take 1 capsule (500 mg total) by mouth 3 (three) times daily. 07/16/18   Malvin Johns, MD  Cholecalciferol (VITAMIN D PO) Take 1,000 Units by mouth daily.    [provider]  ezetimibe (ZETIA) 10 MG tablet Take 1 tablet (10 mg total) by mouth daily. Patient taking differently: Take 5 mg by mouth daily. For cholesterol 02/18/15   Florencia Reasons, MD  furosemide (LASIX) 20 MG tablet Take 1 tablet (20 mg total) by mouth every other day as needed for fluid or edema. 02/18/15   Florencia Reasons, MD  gabapentin (NEURONTIN) 300 MG capsule Take 1 capsule (300  mg total) by mouth 3 (three) times daily. 02/08/16   Melvenia Beam, MD  hydrochlorothiazide (HYDRODIURIL) 12.5 MG tablet Take 12.5 mg by mouth daily. 09/17/15   [provider]  metFORMIN (GLUCOPHAGE) 500 MG tablet Take 500 mg by mouth daily. 12/10/14   [provider]  metoprolol succinate (TOPROL XL) 25 MG 24 hr tablet Take 0.5 tablets (12.5 mg total) by mouth daily. Patient taking differently: Take 25 mg by mouth 2 (two) times daily.  02/20/15   Lelon Perla, MD  Multiple Vitamins-Minerals (CENTRUM SILVER PO) Take 1 tablet by mouth daily.    [provider]  olmesartan (BENICAR) 40 MG tablet Take 1 tablet (40 mg total) by mouth daily.  02/18/15   Florencia Reasons, MD  Omega-3 Fatty Acids (FISH OIL PO) Take 1,200 mg by mouth daily.    [provider]  pravastatin (PRAVACHOL) 20 MG tablet Take 1 tablet (20 mg total) by mouth daily. 02/18/15   Florencia Reasons, MD  traMADol (ULTRAM) 50 MG tablet Take 1 tablet (50 mg total) by mouth every 6 (six) hours as needed. Patient taking differently: Take 50 mg by mouth every 6 (six) hours as needed for moderate pain or severe pain.  01/07/15   Melvenia Beam, MD    Family History Family History  Problem Relation Age of Onset  . Heart Problems Brother   . Cancer Sister     Social History Social History   Tobacco Use  . Smoking status: Never Smoker  . Smokeless tobacco: Never Used  Substance Use Topics  . Alcohol use: Yes    Alcohol/week: 1.0 standard drinks    Types: 1 Glasses of wine per week    Comment: Rare  . Drug use: No     Allergies   Patient has no known allergies.   Review of Systems Review of Systems  Constitutional: Negative for chills and fever.  HENT: Positive for nosebleeds. Negative for congestion and rhinorrhea.   Eyes: Negative for redness and visual disturbance.  Respiratory: Negative for shortness of breath and wheezing.   Cardiovascular: Negative for chest pain and palpitations.  Gastrointestinal: Negative for nausea and vomiting.  Genitourinary: Negative for dysuria and urgency.  Musculoskeletal: Negative for arthralgias and myalgias.  Skin: Negative for pallor and wound.  Neurological: Negative for dizziness and headaches.     Physical Exam Updated Vital Signs BP (!) 150/97   Pulse 76   Temp 98 F (36.7 C) (Oral)   Resp 16   Ht 4\' 11"  (1.499 m)   Wt 76.7 kg   SpO2 96%   BMI 34.13 kg/m   Physical Exam Vitals signs and nursing note reviewed.  Constitutional:      General: She is not in acute distress.    Appearance: She is well-developed. She is not diaphoretic.  HENT:     Head: Normocephalic and atraumatic.  Eyes:     Pupils:  Pupils are equal, round, and reactive to light.  Neck:     Musculoskeletal: Normal range of motion and neck supple.  Cardiovascular:     Rate and Rhythm: Normal rate and regular rhythm.     Heart sounds: No murmur. No friction rub. No gallop.   Pulmonary:     Effort: Pulmonary effort is normal.     Breath sounds: No wheezing or rales.  Abdominal:     General: There is no distension.     Palpations: Abdomen is soft.     Tenderness: There is no  abdominal tenderness.  Musculoskeletal:        General: No tenderness.  Skin:    General: Skin is warm and dry.  Neurological:     Mental Status: She is alert and oriented to person, place, and time.  Psychiatric:        Behavior: Behavior normal.      ED Treatments / Results  Labs (all labs ordered are listed, but only abnormal results are displayed) Labs Reviewed - No data to display  EKG None  Radiology No results found.  Procedures .Epistaxis Management Date/Time: 08/17/2018 7:03 PM Performed by: Deno Etienne, DO Authorized by: Deno Etienne, DO   Consent:    Consent obtained:  Verbal   Consent given by:  Patient and spouse   Risks discussed:  Bleeding, infection, nasal injury and pain   Alternatives discussed:  No treatment and delayed treatment Anesthesia (see MAR for exact dosages):    Anesthesia method:  Topical application   Topical anesthetic:  Epinephrine (lidocaine) Procedure details:    Treatment site:  R anterior and L anterior   Treatment episode: initial   Post-procedure details:    Assessment:  Bleeding stopped   Patient tolerance of procedure:  Tolerated well, no immediate complications Comments:     Improved with lidocaine and epinephrine mixed with Afrin and direct pressure.   (including critical care time)  Medications Ordered in ED Medications  oxymetazoline (AFRIN) 0.05 % nasal spray 1 spray (has no administration in time range)  lidocaine-EPINEPHrine (XYLOCAINE W/EPI) 2 %-1:100000 (with pres)  injection 20 mL (has no administration in time range)  silver nitrate applicators 93-57 % applicator (has no administration in time range)  lidocaine-EPINEPHrine (XYLOCAINE W/EPI) 2 %-1:200000 (PF) injection (has no administration in time range)     Initial Impression / Assessment and Plan / ED Course  I have reviewed the triage vital signs and the nursing notes.  Pertinent labs & imaging results that were available during my care of the patient were reviewed by me and considered in my medical decision making (see chart for details).        78 yo F with a cc of epistaxis.  Going on for the past couple hours.    Patient's bleeding was stopped with some vasoconstriction and direct pressure.  No source of bleeding was noted on my repeat exam.  We will have the patient follow-up with her PCP.  7:24 PM:  I have discussed the diagnosis/risks/treatment options with the patient and family and believe the pt to be eligible for discharge home to follow-up with PCP. We also discussed returning to the ED immediately if new or worsening sx occur. We discussed the sx which are most concerning (e.g., sudden worsening pain, fever, inability to tolerate by mouth) that necessitate immediate return. Medications administered to the patient during their visit and any new prescriptions provided to the patient are listed below.  Medications given during this visit Medications  oxymetazoline (AFRIN) 0.05 % nasal spray 1 spray (has no administration in time range)  lidocaine-EPINEPHrine (XYLOCAINE W/EPI) 2 %-1:100000 (with pres) injection 20 mL (has no administration in time range)  silver nitrate applicators 01-77 % applicator (has no administration in time range)  lidocaine-EPINEPHrine (XYLOCAINE W/EPI) 2 %-1:200000 (PF) injection (has no administration in time range)     The patient appears reasonably screen and/or stabilized for discharge and I doubt any other medical condition or other [facility] requiring  further screening, evaluation, or treatment in the ED at this time prior to  discharge.    Final Clinical Impressions(s) / ED Diagnoses   Final diagnoses:  Right-sided epistaxis    ED Discharge Orders    None       Deno Etienne, DO 08/17/18 1924

## 2018-08-17 NOTE — ED Notes (Signed)
ED MD informed of nose bleed and nose clamp applied with tonsil suction at bedside, instructed on use

## 2018-08-17 NOTE — ED Triage Notes (Signed)
Nose bleed x 1 hr, from both nares, on Eliquis

## 2018-08-17 NOTE — Discharge Instructions (Signed)
If this bleeding reoccurs please blow your nose take 2 inhales of the medication and hold pressure for 15 minutes without peaking.  If this does not resolve your bleeding you can try one more time, and then if it continues have someone take a look at it.

## 2018-08-17 NOTE — ED Notes (Signed)
ED Provider at bedside. 

## 2018-08-28 NOTE — Progress Notes (Addendum)
HPI: FU atrial fibrillation. Patient admitted to [facility] in August 2016 with a TIA. Patient noted to be in atrial fib. She was treated with anticoagulation. Echocardiogram August 2016 showed normal LV function, restrictive filling, mild tricuspid regurgitation and trace mitral regurgitation. Carotid Dopplers August 2016 showed 1-39% bilateral stenosis.  Note she was bradycardic on atenolol 50 mg daily.  Since last seen, patient denies dyspnea, chest pain, palpitations or syncope.  She did have a nosebleed previously.  Current Outpatient Medications  Medication Sig Dispense Refill  . apixaban (ELIQUIS) 5 MG TABS tablet Take 1 tablet (5 mg total) by mouth 2 (two) times daily. 60 tablet 3  . BIOTIN 5000 PO Take 1 tablet by mouth daily.    . Cholecalciferol (VITAMIN D PO) Take 1,000 Units by mouth daily.    Marland Kitchen ezetimibe (ZETIA) 10 MG tablet Take 1 tablet (10 mg total) by mouth daily. (Patient taking differently: Take 5 mg by mouth daily. For cholesterol) 30 tablet 3  . furosemide (LASIX) 20 MG tablet Take 1 tablet (20 mg total) by mouth every other day as needed for fluid or edema. 30 tablet 0  . gabapentin (NEURONTIN) 300 MG capsule Take 1 capsule (300 mg total) by mouth 3 (three) times daily. 90 capsule 3  . hydrochlorothiazide (HYDRODIURIL) 12.5 MG tablet Take 12.5 mg by mouth daily.    . metFORMIN (GLUCOPHAGE) 500 MG tablet Take 500 mg by mouth daily.    . metoprolol succinate (TOPROL XL) 25 MG 24 hr tablet Take 0.5 tablets (12.5 mg total) by mouth daily. (Patient taking differently: Take 25 mg by mouth 2 (two) times daily. ) 90 tablet 3  . Multiple Vitamins-Minerals (CENTRUM SILVER PO) Take 1 tablet by mouth daily.    Marland Kitchen olmesartan (BENICAR) 40 MG tablet Take 1 tablet (40 mg total) by mouth daily. 30 tablet 3  . Omega-3 Fatty Acids (FISH OIL PO) Take 1,200 mg by mouth daily.    . pravastatin (PRAVACHOL) 20 MG tablet Take 1 tablet (20 mg total) by mouth daily. 30 tablet 0  .  traMADol (ULTRAM) 50 MG tablet Take 1 tablet (50 mg total) by mouth every 6 (six) hours as needed. (Patient taking differently: Take 50 mg by mouth every 6 (six) hours as needed for moderate pain or severe pain. ) 50 tablet 4   No current facility-administered medications for this visit.      Past Medical History:  Diagnosis Date  . Cancer ([place])   . Diabetes mellitus   . Fibromyalgia   . Hyperlipidemia   . Hypertension   . 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 Neuropathy   . PAF (paroxysmal atrial fibrillation) ([place])   . Stroke ([place])   . Urinary tract infection 09/19/11   Being treated at this time Cipro    Past Surgical History:  Procedure Laterality Date  . APPENDECTOMY    . CATARACT EXTRACTION  2015  . CHOLECYSTECTOMY    . JOINT REPLACEMENT    . JOINT REPLACEMENT    . ROTATOR CUFF REPAIR      Social History   Socioeconomic History  . Marital status: Married    Spouse name: [name]   . Number of children: 6  . Years of education: 61  . Highest education level: Not on file  Occupational History  . Occupation: Retired   Scientific laboratory technician  . Financial resource strain: Not on file  . Food insecurity:    Worry: Not on  file    Inability: Not on file  . Transportation needs:    Medical: Not on file    Non-medical: Not on file  Tobacco Use  . Smoking status: Never Smoker  . Smokeless tobacco: Never Used  Substance and Sexual Activity  . Alcohol use: Yes    Alcohol/week: 1.0 standard drinks    Types: 1 Glasses of wine per week    Comment: Rare  . Drug use: No  . Sexual activity: Not on file  Lifestyle  . Physical activity:    Days per week: Not on file    Minutes per session: Not on file  . Stress: Not on file  Relationships  . Social connections:    Talks on phone: Not on file    Gets together: Not on file    Attends religious service: Not on file    Active member of club or organization: Not on file    Attends meetings of clubs or organizations: Not on file      Relationship status: Not on file  . Intimate partner violence:    Fear of current or ex partner: Not on file    Emotionally abused: Not on file    Physically abused: Not on file    Forced sexual activity: Not on file  Other Topics Concern  . Not on file  Social History Narrative   Lives at home with husband, [name].   Caffeine use: 2-3 cups coffee per day    Family History  Problem Relation Age of Onset  . Heart Problems Brother   . Cancer Sister     ROS: no fevers or chills, productive cough, hemoptysis, dysphasia, odynophagia, melena, hematochezia, dysuria, hematuria, rash, seizure activity, orthopnea, PND, pedal edema, claudication. Remaining systems are negative.  Physical Exam: Well-developed well-nourished in no acute distress.  Skin is warm and dry.  HEENT is normal.  Neck is supple.  Chest is clear to auscultation with normal expansion.  Cardiovascular exam is irregular Abdominal exam nontender or distended. No masses palpated. Extremities show no edema. neuro grossly intact  Electrocardiogram today shows atrial fibrillation at a rate of 78, left ventricular hypertrophy, nonspecific ST changes, personally reviewed.  A/P  1 paroxysmal atrial fibrillation-patient is in atrial fibrillation today.  She has had a previous TIA and will require lifelong anticoagulation.  Continue apixaban.  Hemoglobin and renal function monitored by primary care.  Note she had bradycardia on atenolol 50 mg daily previously.  We will continue present dose of Toprol.  Check 24-hour Holter monitor to make sure that rate is adequately controlled.  Note she is asymptomatic and therefore rate control and anticoagulation is likely appropriate.  2 hypertension-patient's blood pressure is controlled.  Continue present medications and follow.  3 hyperlipidemia-continue statin.  Kirk Ruths, MD

## 2018-09-04 ENCOUNTER — Other Ambulatory Visit: Payer: Self-pay

## 2018-09-04 ENCOUNTER — Ambulatory Visit (INDEPENDENT_AMBULATORY_CARE_PROVIDER_SITE_OTHER): Payer: Medicare Other | Admitting: Cardiology

## 2018-09-04 ENCOUNTER — Encounter: Payer: Self-pay | Admitting: Cardiology

## 2018-09-04 VITALS — BP 138/82 | HR 77 | Ht 59.0 in | Wt 169.0 lb

## 2018-09-04 DIAGNOSIS — E7800 Pure hypercholesterolemia, unspecified: Secondary | ICD-10-CM

## 2018-09-04 DIAGNOSIS — I480 Paroxysmal atrial fibrillation: Secondary | ICD-10-CM

## 2018-09-04 DIAGNOSIS — I10 Essential (primary) hypertension: Secondary | ICD-10-CM | POA: Diagnosis not present

## 2018-09-04 NOTE — Patient Instructions (Signed)
Medication Instructions:  NO CHANGE If you need a refill on your cardiac medications before your next appointment, please call your pharmacy.   Lab work: If you have labs (blood work) drawn today and your tests are completely normal, you will receive your results only by: Marland Kitchen MyChart Message (if you have MyChart) OR . A paper copy in the mail If you have any lab test that is abnormal or we need to change your treatment, we will call you to review the results.  Testing/Procedures: Your physician has recommended that you wear a 24 HOUR holter monitor. Holter monitors are medical devices that record the heart's electrical activity. Doctors most often use these monitors to diagnose arrhythmias. Arrhythmias are problems with the speed or rhythm of the heartbeat. The monitor is a small, portable device. You can wear one while you do your normal daily activities. This is usually used to diagnose what is causing palpitations/syncope (passing out).    Follow-Up: At [facility], you and your health needs are our priority.  As part of our continuing mission to provide you with exceptional heart care, we have created designated Provider Care Teams.  These Care Teams include your primary Cardiologist (physician) and Advanced Practice Providers (APPs -  Physician Assistants and Nurse Practitioners) who all work together to provide you with the care you need, when you need it. You will need a follow up appointment in 12 months.  Please call our office 2 months in advance to schedule this appointment.  You may see Kirk Ruths MD or one of the following Advanced Practice Providers on your designated Care Team:   Kerin Ransom, PA-C Roby Lofts, [place] . Sande Rives, PA-C

## 2018-09-20 ENCOUNTER — Emergency Department (HOSPITAL_BASED_OUTPATIENT_CLINIC_OR_DEPARTMENT_OTHER): Payer: Medicare Other

## 2018-09-20 ENCOUNTER — Encounter (HOSPITAL_BASED_OUTPATIENT_CLINIC_OR_DEPARTMENT_OTHER): Payer: Self-pay | Admitting: *Deleted

## 2018-09-20 ENCOUNTER — Emergency Department (HOSPITAL_BASED_OUTPATIENT_CLINIC_OR_DEPARTMENT_OTHER)
Admission: EM | Admit: 2018-09-20 | Discharge: 2018-09-20 | Disposition: A | Discharge status: Home / Self Care | Payer: Medicare Other | Attending: Emergency Medicine | Admitting: Emergency Medicine

## 2018-09-20 ENCOUNTER — Other Ambulatory Visit: Payer: Self-pay

## 2018-09-20 DIAGNOSIS — E119 Type 2 diabetes mellitus without complications: Secondary | ICD-10-CM | POA: Insufficient documentation

## 2018-09-20 DIAGNOSIS — S0990XA Unspecified injury of head, initial encounter: Secondary | ICD-10-CM | POA: Diagnosis present

## 2018-09-20 DIAGNOSIS — Z7984 Long term (current) use of oral hypoglycemic drugs: Secondary | ICD-10-CM | POA: Insufficient documentation

## 2018-09-20 DIAGNOSIS — S01111A Laceration without foreign body of right eyelid and periocular area, initial encounter: Secondary | ICD-10-CM | POA: Diagnosis not present

## 2018-09-20 DIAGNOSIS — I10 Essential (primary) hypertension: Secondary | ICD-10-CM | POA: Diagnosis not present

## 2018-09-20 DIAGNOSIS — M25551 Pain in right hip: Secondary | ICD-10-CM | POA: Insufficient documentation

## 2018-09-20 DIAGNOSIS — Z23 Encounter for immunization: Secondary | ICD-10-CM | POA: Diagnosis not present

## 2018-09-20 DIAGNOSIS — Z8572 Personal history of non-Hodgkin lymphomas: Secondary | ICD-10-CM | POA: Diagnosis not present

## 2018-09-20 DIAGNOSIS — Y999 Unspecified external cause status: Secondary | ICD-10-CM | POA: Diagnosis not present

## 2018-09-20 DIAGNOSIS — W108XXA Fall (on) (from) other stairs and steps, initial encounter: Secondary | ICD-10-CM | POA: Insufficient documentation

## 2018-09-20 DIAGNOSIS — Y92000 Kitchen of unspecified non-institutional (private) residence as  the place of occurrence of the external cause: Secondary | ICD-10-CM | POA: Diagnosis not present

## 2018-09-20 DIAGNOSIS — Y9389 Activity, other specified: Secondary | ICD-10-CM | POA: Insufficient documentation

## 2018-09-20 DIAGNOSIS — Z7901 Long term (current) use of anticoagulants: Secondary | ICD-10-CM | POA: Insufficient documentation

## 2018-09-20 DIAGNOSIS — Z8673 Personal history of transient ischemic attack (TIA), and cerebral infarction without residual deficits: Secondary | ICD-10-CM | POA: Insufficient documentation

## 2018-09-20 DIAGNOSIS — W19XXXA Unspecified fall, initial encounter: Secondary | ICD-10-CM

## 2018-09-20 MED ORDER — ACETAMINOPHEN 325 MG PO TABS
650.0000 mg | ORAL_TABLET | Freq: Once | ORAL | Status: AC
  Administered 2018-09-20: 650 mg via ORAL
  Filled 2018-09-20: qty 2

## 2018-09-20 MED ORDER — TETANUS-DIPHTH-ACELL PERTUSSIS 5-2.5-18.5 LF-MCG/0.5 IM SUSP
0.5000 mL | Freq: Once | INTRAMUSCULAR | Status: AC
  Administered 2018-09-20: 17:00:00 0.5 mL via INTRAMUSCULAR
  Filled 2018-09-20: qty 0.5

## 2018-09-20 NOTE — Discharge Instructions (Addendum)
You have been diagnosed today with Fall and Eyebrow Laceration.  At this time there does not appear to be the presence of an emergent medical condition, however there is always the potential for conditions to change. Please read and follow the below instructions.  Please return to the Emergency Department immediately for any new or worsening symptoms. Please be sure to follow up with your Primary Care Provider within one week regarding your visit today; please call their office to schedule an appointment even if you are feeling better for a follow-up visit. The wound adhesive applied to your eyebrow today for follow-up of the next 4-5 days on its own.  Monitor for signs of infection including pain, redness, streaking, drainage or increased swelling.  Cool compresses with ice will help with your swelling and pain. Additionally your CT scan of your head and neck showed arthritis and chronic changes as did the x-rays of your shoulder and hip.  Please discuss these with your primary care provider at your follow-up visit next week.  Return to emergency department for any new/concerning or worsening symptoms. Additionally your blood pressure was elevated today.  Please follow-up with your primary care provider within 1 week for blood pressure recheck and medication management.  Get help right away if: You have: A very bad (severe) headache that is not helped by medicine. Trouble walking or weakness in your arms and legs. Clear or bloody fluid coming from your nose or ears. Changes in your seeing (vision). Jerky movements that you cannot control (seizure). You throw up (vomit). Your symptoms get worse. You lose balance. Your speech is slurred. You pass out. You are sleepier and have trouble staying awake. The black centers of your eyes (pupils) change in size. Have any of the following: A fever. Chills. Redness, swelling, or pain around your wound. Fluid or blood coming from your wound. Pus or  a bad smell coming from your wound. Notice that your wound feels warm to the touch. Notice that the edges of your wound start to separate after your sutures come out. Notice that your wound becomes thick, raised, and darker in color after your sutures come out (scarring).  Please read the additional information packets attached to your discharge summary.  Do not take your medicine if  develop an itchy rash, swelling in your mouth or lips, or difficulty breathing.

## 2018-09-20 NOTE — ED Notes (Signed)
PT states understanding of care given, follow up care, and medication prescribed. PT ambulated from ED to car with a steady gait. 

## 2018-09-20 NOTE — ED Provider Notes (Signed)
[facility] EMERGENCY DEPARTMENT Provider Note   CSN: [id number] Arrival date & time: 09/20/18  1645    History   Chief Complaint Chief Complaint  Patient presents with  . Laceration    HPI IMA [name] is a 78 y.o. female presenting following mechanical fall that occurred approximately 1 hour prior to arrival.  Patient states that she was stepping down off a step stool in her kitchen when her foot slipped causing her to fall onto her right side.  Patient states that she fell from her right hip onto her right shoulder before striking the right eyebrow on the ground.  Patient denies loss of consciousness from the event.  She had laceration to the right eyebrow that she immediately controlled with direct pressure.  Patient endorses a mild throbbing pain to her right eyebrow constant worsened with palpation and without alleviating factor.  Additionally patient endorses a mild right shoulder pain constant without aggravating or relieving factors describes as a soreness, she has full range of motion and strength of the right shoulder.  Patient additionally endorses a mild intensity throbbing pain to the right hip worsened with palpation and without alleviating factor.  She has been ambulatory since the accident without difficulty.  Of note patient is on Eliquis.  She denies headache, vision changes, numbness/tingling or weakness.  She denies chest pain, shortness of breath, cough, abdominal pain, nausea/vomiting or any additional concerns today.     HPI  Past Medical History:  Diagnosis Date  . Cancer ([place])   . Diabetes mellitus   . Fibromyalgia   . Hyperlipidemia   . Hypertension   . 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 Neuropathy   . PAF (paroxysmal atrial fibrillation) ([place])   . Stroke ([place])   . Urinary tract infection 09/19/11   Being treated at this time Cipro    Patient Active Problem List   Diagnosis Date Noted  . Abnormal EKG 03/06/2018  . Syncope  03/06/2018  . Preop cardiovascular exam 03/06/2018  . 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 Chronic anticoagulation 04/21/2015  . PAF (paroxysmal atrial fibrillation) ([place]) 02/20/2015  . Essential hypertension 02/20/2015  . 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 Dyslipidemia   . Slurred speech 02/16/2015  . History of TIA (transient ischemic attack) 02/16/2015  . Diabetes ([place]) 02/16/2015  . Atrial flutter ([place]) 02/16/2015  . Acute renal failure ([place]) 02/16/2015  . Hereditary and idiopathic peripheral neuropathy 12/16/2014  . FLANK PAIN, RIGHT 01/19/2010  . Non Hodgkin's lymphoma ([place]) 11/30/2009  . UNSPECIFIED DISORDER OF KIDNEY AND URETER 11/30/2009  . DIARRHEA 11/30/2009  . FEVER, HX OF 11/30/2009    Past Surgical History:  Procedure Laterality Date  . APPENDECTOMY    . CATARACT EXTRACTION  2015  . CHOLECYSTECTOMY    . JOINT REPLACEMENT    . JOINT REPLACEMENT    . ROTATOR CUFF REPAIR       OB History   No obstetric history on file.      Home Medications    Prior to Admission medications   Medication Sig Start Date End Date Taking? Authorizing Provider  apixaban (ELIQUIS) 5 MG TABS tablet Take 1 tablet (5 mg total) by mouth 2 (two) times daily. 02/18/15   Florencia Reasons, MD  BIOTIN 5000 PO Take 1 tablet by mouth daily.    [provider]  Cholecalciferol (VITAMIN D PO) Take 1,000 Units by mouth daily.    [provider]  ezetimibe (ZETIA) 10 MG tablet Take 1 tablet (10 mg total) by mouth daily. Patient taking differently: Take 5 mg by mouth daily. For cholesterol 02/18/15   Florencia Reasons, MD  furosemide (LASIX) 20 MG tablet Take 1 tablet (20 mg total) by mouth every other day as needed for fluid or edema. 02/18/15   Florencia Reasons, MD  gabapentin (NEURONTIN) 300 MG capsule Take 1 capsule (300 mg total) by mouth 3 (three) times daily. 02/08/16   Melvenia Beam, MD  hydrochlorothiazide (HYDRODIURIL) 12.5 MG tablet Take  12.5 mg by mouth daily. 09/17/15   [provider]  metFORMIN (GLUCOPHAGE) 500 MG tablet Take 500 mg by mouth daily. 12/10/14   [provider]  metoprolol succinate (TOPROL XL) 25 MG 24 hr tablet Take 0.5 tablets (12.5 mg total) by mouth daily. Patient taking differently: Take 25 mg by mouth 2 (two) times daily.  02/20/15   Lelon Perla, MD  Multiple Vitamins-Minerals (CENTRUM SILVER PO) Take 1 tablet by mouth daily.    [provider]  olmesartan (BENICAR) 40 MG tablet Take 1 tablet (40 mg total) by mouth daily. 02/18/15   Florencia Reasons, MD  Omega-3 Fatty Acids (FISH OIL PO) Take 1,200 mg by mouth daily.    [provider]  pravastatin (PRAVACHOL) 20 MG tablet Take 1 tablet (20 mg total) by mouth daily. 02/18/15   Florencia Reasons, MD  traMADol (ULTRAM) 50 MG tablet Take 1 tablet (50 mg total) by mouth every 6 (six) hours as needed. Patient taking differently: Take 50 mg by mouth every 6 (six) hours as needed for moderate pain or severe pain.  01/07/15   Melvenia Beam, MD    Family History Family History  Problem Relation Age of Onset  . Heart Problems Brother   . Cancer Sister     Social History Social History   Tobacco Use  . Smoking status: Never Smoker  . Smokeless tobacco: Never Used  Substance Use Topics  . Alcohol use: Yes    Alcohol/week: 1.0 standard drinks    Types: 1 Glasses of wine per week    Comment: Rare  . Drug use: No     Allergies   Patient has no known allergies.   Review of Systems Review of Systems  Constitutional: Negative.  Negative for chills and fever.  Eyes: Negative.  Negative for visual disturbance.  Respiratory: Negative.  Negative for cough and shortness of breath.   Cardiovascular: Negative.  Negative for chest pain.  Gastrointestinal: Negative.  Negative for abdominal pain, diarrhea, nausea and vomiting.  Musculoskeletal: Positive for arthralgias (Right hip and right shoulder). Negative for back pain, neck pain and  neck stiffness.  Skin: Positive for wound (Laceration of the right eyebrow).  Neurological: Negative.  Negative for dizziness, syncope, weakness, numbness and headaches.  All other systems reviewed and are negative.  Physical Exam Updated Vital Signs BP (!) 161/96 (BP Location: Right Arm)   Pulse 75   Temp (!) 97.5 F (36.4 C) (Oral)   Resp 18   SpO2 98%   Physical Exam Constitutional:      General: She is not in acute distress.    Appearance: Normal appearance. She is well-developed. She is not ill-appearing or diaphoretic.  HENT:     Head: Normocephalic. No raccoon eyes or Battle's sign.     Jaw: There is normal jaw occlusion. No trismus.     Comments: Patient with 1 cm laceration of the right eyebrow with hematoma  Right Ear: Tympanic membrane, ear canal and external ear normal. No hemotympanum.     Left Ear: Tympanic membrane, ear canal and external ear normal. No hemotympanum.     Nose: Nose normal.     Mouth/Throat:     Lips: Pink.     Mouth: Mucous membranes are moist.     Pharynx: Oropharynx is clear. Uvula midline.  Eyes:     General: Vision grossly intact. Gaze aligned appropriately.     Extraocular Movements: Extraocular movements intact.     Conjunctiva/sclera: Conjunctivae normal.     Pupils: Pupils are equal, round, and reactive to light.      Comments: Visual fields grossly intact bilaterally No entrapment or pain with EOM.  Neck:     Musculoskeletal: Full passive range of motion without pain, normal range of motion and neck supple. No spinous process tenderness or muscular tenderness.     Trachea: Trachea and phonation normal. No tracheal tenderness or tracheal deviation.  Cardiovascular:     Rate and Rhythm: Normal rate and regular rhythm.     Pulses:          Dorsalis pedis pulses are 2+ on the right side and 2+ on the left side.       Posterior tibial pulses are 2+ on the right side and 2+ on the left side.     Heart sounds: Normal heart sounds.   Pulmonary:     Effort: Pulmonary effort is normal. No accessory muscle usage or respiratory distress.     Breath sounds: Normal breath sounds and air entry.  Chest:     Chest wall: No deformity, tenderness or crepitus.     Comments: No sign of injury to the chest Abdominal:     General: There is no distension.     Palpations: Abdomen is soft.     Tenderness: There is no abdominal tenderness. There is no guarding or rebound.     Comments: No sign of injury to the abdomen  Musculoskeletal: Normal range of motion.     Right shoulder: She exhibits tenderness. She exhibits normal range of motion, no swelling, no effusion, no crepitus and no deformity.     Left shoulder: Normal.     Right elbow: Normal.    Left elbow: Normal.     Right wrist: Normal.     Right hip: She exhibits tenderness. She exhibits normal range of motion, normal strength, no crepitus and no deformity.     Left hip: Normal.     Right knee: Normal.     Left knee: Normal.     Right ankle: Normal.     Left ankle: Normal.     Cervical back: Normal.     Thoracic back: Normal.     Lumbar back: Normal.     Comments: No midline C/T/L spinal tenderness to palpation, no paraspinal muscle tenderness, no deformity, crepitus, or step-off noted. No sign of injury to the neck or back.  Patient endorses mild right hip tenderness to palpation.  Hips stable to compression bilaterally without pain.  Patient is able to actively bring bilateral knees to chest without difficulty.  Patient with mild muscular tenderness of the right lateral deltoid, no signs of injury present.  Full range of motion and appropriate strength to bilateral shoulders.  She can touch her hands above her head and behind her back without difficulty.   NVI to all four extermities.  Feet:     Right foot:     Protective  Sensation: 5 sites tested. 5 sites sensed.     Left foot:     Protective Sensation: 5 sites tested. 5 sites sensed.  Skin:    General: Skin is  warm and dry.  Neurological:     Mental Status: She is alert.     GCS: GCS eye subscore is 4. GCS verbal subscore is 5. GCS motor subscore is 6.     Comments: Speech is clear and goal oriented, follows commands Major Cranial nerves without deficit, no facial droop Normal strength in upper and lower extremities bilaterally including dorsiflexion and plantar flexion, strong and equal grip strength Sensation normal to light touch Moves extremities without ataxia, coordination intact Normal finger to nose and rapid alternating movements Neg romberg, no pronator drift Normal gait  Psychiatric:        Behavior: Behavior normal.    ED Treatments / Results  Labs (all labs ordered are listed, but only abnormal results are displayed) Labs Reviewed - No data to display  EKG None  Radiology Dg Shoulder Right  Result Date: 09/20/2018 CLINICAL DATA:  Right shoulder pain after fall. EXAM: RIGHT SHOULDER - 2+ VIEW COMPARISON:  None. FINDINGS: There is no evidence of fracture or dislocation. Severe degenerative changes seen involving the right acromioclavicular joint. Soft tissues are unremarkable. 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 No acute abnormality seen in the right shoulder. Electronically Signed   By: Marijo Conception, M.D.   On: 09/20/2018 21:06   Ct Head Wo Contrast  Result Date: 09/20/2018 CLINICAL DATA:  Fall. EXAM: CT HEAD WITHOUT CONTRAST CT CERVICAL SPINE WITHOUT CONTRAST TECHNIQUE: Multidetector CT imaging of the head and cervical spine was performed following the standard protocol without intravenous contrast. Multiplanar CT image reconstructions of the cervical spine were also generated. COMPARISON:  MR brain dated July 09, 2015. CT head dated February 16, 2015. FINDINGS: CT HEAD FINDINGS Brain: No evidence of acute infarction, hemorrhage, hydrocephalus, extra-axial collection or mass lesion/mass effect. Stable mild atrophy and chronic  microvascular ischemic changes. Vascular: Atherosclerotic vascular calcification of the carotid siphons. No hyperdense vessel. Skull: Negative for fracture or focal lesion. Sinuses/Orbits: No acute finding. Other: None. CT CERVICAL SPINE FINDINGS Alignment: Facet mediated trace anterolisthesis at C3-C4 and 2 mm anterolisthesis at C7-T1. No traumatic malalignment. Skull base and vertebrae: No acute fracture. No primary bone lesion or focal pathologic process. Soft tissues and spinal canal: No prevertebral fluid or swelling. No visible canal hematoma. Disc levels: Mild-to-moderate disc height loss at C3-C4 and C7-T1. Moderate to severe disc height loss and uncovertebral hypertrophy from C4-C5 through C6-C7. Moderate bilateral facet arthropathy throughout the cervical spine. Ankylosis of the left C4-C5 facet joint. Upper chest: Negative. Other: None. IMPRESSION: 1. No acute intracranial abnormality. Stable mild atrophy and chronic microvascular ischemic changes. 2. No acute cervical spine fracture. Advanced multilevel cervical spondylosis. Electronically Signed   By: Titus Dubin M.D.   On: 09/20/2018 20:48   Ct Cervical Spine Wo Contrast  Result Date: 09/20/2018 CLINICAL DATA:  Fall. EXAM: CT HEAD WITHOUT CONTRAST CT CERVICAL SPINE WITHOUT CONTRAST TECHNIQUE: Multidetector CT imaging of the head and cervical spine was performed following the standard protocol without intravenous contrast. Multiplanar CT image reconstructions of the cervical spine were also generated. COMPARISON:  MR brain dated July 09, 2015. CT head dated February 16, 2015. FINDINGS: CT HEAD FINDINGS Brain: No evidence of acute infarction, hemorrhage, hydrocephalus, extra-axial collection or mass lesion/mass effect. Stable mild atrophy and chronic microvascular ischemic changes. Vascular:  Atherosclerotic vascular calcification of the carotid siphons. No hyperdense vessel. Skull: Negative for fracture or focal lesion. Sinuses/Orbits: No acute  finding. Other: None. CT CERVICAL SPINE FINDINGS Alignment: Facet mediated trace anterolisthesis at C3-C4 and 2 mm anterolisthesis at C7-T1. No traumatic malalignment. Skull base and vertebrae: No acute fracture. No primary bone lesion or focal pathologic process. Soft tissues and spinal canal: No prevertebral fluid or swelling. No visible canal hematoma. Disc levels: Mild-to-moderate disc height loss at C3-C4 and C7-T1. Moderate to severe disc height loss and uncovertebral hypertrophy from C4-C5 through C6-C7. Moderate bilateral facet arthropathy throughout the cervical spine. Ankylosis of the left C4-C5 facet joint. Upper chest: Negative. Other: None. IMPRESSION: 1. No acute intracranial abnormality. Stable mild atrophy and chronic microvascular ischemic changes. 2. No acute cervical spine fracture. Advanced multilevel cervical spondylosis. Electronically Signed   By: Titus Dubin M.D.   On: 09/20/2018 20:48   Dg Hip Unilat W Or Wo Pelvis 2-3 Views Right  Result Date: 09/20/2018 CLINICAL DATA:  Right hip pain after fall. EXAM: DG HIP (WITH OR WITHOUT PELVIS) 2-3V RIGHT COMPARISON:  None. FINDINGS: There is no evidence of hip fracture or dislocation. There is no evidence of arthropathy or other focal bone abnormality. IMPRESSION: Negative. Electronically Signed   By: Marijo Conception, M.D.   On: 09/20/2018 21:04    Procedures .Marland KitchenLaceration Repair Date/Time: 09/21/2018 1:11 AM Performed by: Deliah Boston, PA-C Authorized by: Deliah Boston, PA-C   Consent:    Consent obtained:  Verbal   Consent given by:  Patient   Risks discussed:  Infection, pain, retained foreign body, tendon damage, poor cosmetic result, poor wound healing, vascular damage, nerve damage and need for additional repair Anesthesia (see MAR for exact dosages):    Anesthesia method:  None Laceration details:    Location:  Face   Face location:  R eyebrow   Length (cm):  1   Depth (mm):  2 Repair type:    Repair type:   Simple Pre-procedure details:    Preparation:  Patient was prepped and draped in usual sterile fashion and imaging obtained to evaluate for foreign bodies Exploration:    Hemostasis achieved with:  Direct pressure   Wound exploration: wound explored through full range of motion and entire depth of wound probed and visualized     Wound extent: no foreign bodies/material noted, no muscle damage noted, no nerve damage noted, no tendon damage noted, no underlying fracture noted and no vascular damage noted     Contaminated: no   Treatment:    Area cleansed with:  Shur-Clens and soap and water   Amount of cleaning:  Standard   Irrigation solution:  Sterile saline   Irrigation volume:  500 ml   Irrigation method:  Pressure wash Skin repair:    Repair method:  Tissue adhesive Approximation:    Approximation:  Close Post-procedure details:    Dressing:  Sterile dressing and non-adherent dressing   Patient tolerance of procedure:  Tolerated well, no immediate complications Comments:     Dressing applied by nursing staff. Discussed scar minimization techniques.   (including critical care time)  Medications Ordered in ED Medications  Tdap (BOOSTRIX) injection 0.5 mL (0.5 mLs Intramuscular Given 09/20/18 1716)  acetaminophen (TYLENOL) tablet 650 mg (650 mg Oral Given 09/20/18 1855)     Initial Impression / Assessment and Plan / ED Course  I have reviewed the triage vital signs and the nursing notes.  Pertinent labs & imaging results that  were available during my care of the patient were reviewed by me and considered in my medical decision making (see chart for details).    78 year old female on eliquis presents to the emergency department after mechanical fall today at home. Patient with small laceration and hematoma to right eyebrow. Laceration cleaned throughly in the emergency department and repaired as above. Tdap updated and discussed wound care and return precautions and scare  minimization techniques. Remainder of exam unremarkable. No sign of significant head injury. No sign of injury to the neck, back, chest, abdomen or pelvis. Lungs CTAB. Neuro exam without deficit. Patient ambulatory without assistance. Patient with muscular tenderness of the right lateral deltoid and right hip musculature, she has full ROM and appropriate strength with all movements. All four extremities NVI without signs of injury/deformity, cellulitis, septic arthritis, DVT, compartment syndrome or other acute processes. Imaging was obtained as patient is higher risk due to age and anticoagulation.  CT head/cervical spine:IMPRESSION: 1. No acute intracranial abnormality. Stable mild atrophy and chronic microvascular ischemic changes. 2. No acute cervical spine fracture. Advanced multilevel cervical spondylosis.   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 No acute abnormality seen in the right shoulder.  DG Hips:IMPRESSION: Negative.  ------------ Patient updated on incidental imaging findings and to follow-up with her PCP. Additionally she was made aware of elevated blood pressure readings; she is asymptomatic regarding this. I have encouraged her to follow-up with PCP for recheck and medication management within one week.  At this time there does not appear to be any evidence of an acute emergency medical condition and the patient appears stable for discharge with appropriate outpatient follow up. Diagnosis was discussed with patient who verbalizes understanding of care plan and is agreeable to discharge. I have discussed return precautions with patient who verbalizes understanding of return precautions. Patient encouraged to follow-up with their PCP. All questions answered. Patient has been discharged in good condition.  Patient was evaluated during this visit by Dr. Reather Converse who agrees with plan to discharge with PCP follow-up.   Note: Portions  of this report may have been transcribed using voice recognition software. Every effort was made to ensure accuracy; however, inadvertent computerized transcription errors may still be present.   Final Clinical Impressions(s) / ED Diagnoses   Final diagnoses:  Fall, initial encounter  Laceration of right eyebrow, initial encounter    ED Discharge Orders    None       Gari Crown 09/21/18 0113    Elnora Morrison, MD 09/24/18 [phone]

## 2018-09-20 NOTE — ED Triage Notes (Signed)
1/4" laceration over her right eyebrow. No LOC. She fell while getting off a step stool. Bleeding controlled.

## 2018-09-20 NOTE — ED Notes (Addendum)
CT Head, not completed, charted completed in error, see below

## 2018-09-20 NOTE — ED Notes (Signed)
ED Provider at bedside. 

## 2018-09-20 NOTE — ED Notes (Signed)
Awaiting XR and CT

## 2018-09-21 ENCOUNTER — Encounter (HOSPITAL_BASED_OUTPATIENT_CLINIC_OR_DEPARTMENT_OTHER): Payer: Self-pay | Admitting: Medical

## 2018-09-21 NOTE — Progress Notes (Signed)
error 

## 2018-10-01 ENCOUNTER — Telehealth: Payer: Self-pay | Admitting: *Deleted

## 2018-10-01 NOTE — Telephone Encounter (Signed)
Contact patient regarding cancelling 10/02/18 appointment to have holter monitor applied due to COVID -19 precautions.  Patient will be enrolled for [employer] to mail a 3 day ZIO XT long term holter monitor to her home instead.

## 2018-10-05 ENCOUNTER — Ambulatory Visit (INDEPENDENT_AMBULATORY_CARE_PROVIDER_SITE_OTHER): Payer: Medicare Other

## 2018-10-05 DIAGNOSIS — I480 Paroxysmal atrial fibrillation: Secondary | ICD-10-CM | POA: Diagnosis not present

## 2018-10-29 ENCOUNTER — Other Ambulatory Visit: Payer: Self-pay

## 2019-01-31 ENCOUNTER — Other Ambulatory Visit (HOSPITAL_BASED_OUTPATIENT_CLINIC_OR_DEPARTMENT_OTHER): Payer: Self-pay | Admitting: Internal Medicine

## 2019-01-31 DIAGNOSIS — Z1231 Encounter for screening mammogram for malignant neoplasm of breast: Secondary | ICD-10-CM

## 2019-02-13 ENCOUNTER — Ambulatory Visit (HOSPITAL_BASED_OUTPATIENT_CLINIC_OR_DEPARTMENT_OTHER)
Admission: RE | Admit: 2019-02-13 | Discharge: 2019-02-13 | Disposition: A | Discharge status: Home / Self Care | Payer: Medicare Other | Source: Ambulatory Visit | Attending: Internal Medicine | Admitting: Internal Medicine

## 2019-02-13 ENCOUNTER — Other Ambulatory Visit: Payer: Self-pay

## 2019-02-13 DIAGNOSIS — Z1231 Encounter for screening mammogram for malignant neoplasm of breast: Secondary | ICD-10-CM | POA: Diagnosis not present

## 2019-04-01 ENCOUNTER — Telehealth: Payer: Self-pay | Admitting: Child and Adolescent Psychiatry

## 2019-04-01 NOTE — Telephone Encounter (Signed)
Encounter created in error

## 2019-04-29 ENCOUNTER — Other Ambulatory Visit: Payer: Self-pay | Admitting: Neurological Surgery

## 2019-04-29 DIAGNOSIS — M48062 Spinal stenosis, lumbar region with neurogenic claudication: Secondary | ICD-10-CM

## 2019-05-24 ENCOUNTER — Ambulatory Visit
Admission: RE | Admit: 2019-05-24 | Discharge: 2019-05-24 | Disposition: A | Discharge status: Home / Self Care | Payer: Medicare Other | Source: Ambulatory Visit | Attending: Neurological Surgery | Admitting: Neurological Surgery

## 2019-05-24 DIAGNOSIS — M48062 Spinal stenosis, lumbar region with neurogenic claudication: Secondary | ICD-10-CM

## 2019-05-29 ENCOUNTER — Telehealth: Payer: Self-pay | Admitting: *Deleted

## 2019-05-29 ENCOUNTER — Other Ambulatory Visit: Payer: Self-pay | Admitting: Neurological Surgery

## 2019-05-29 NOTE — Telephone Encounter (Signed)
Spoke with patient and go her scheduled on 05/31/2019 with Almyra Deforest, PA-C; Patient voiced understanding.

## 2019-05-29 NOTE — Telephone Encounter (Signed)
Needs preop eval with APP Kirk Ruths

## 2019-05-29 NOTE — Telephone Encounter (Signed)
   [facility] Pre-operative Risk Assessment    Request for surgical clearance:  1. What type of surgery is being performed? L3-4, L4-5 LUMBAR LAMINECTOMY w/NON-INSTRUMENTED FUSION   2. When is this surgery scheduled? 06/07/19   3. What type of clearance is required (medical clearance vs. Pharmacy clearance to hold med vs. Both)? BOTH  4. Are there any medications that need to be held prior to surgery and how long? ELIQUIS   5. Practice name and name of physician performing surgery? [facility]; DR. Sherley Bounds  6. What is your office phone number [phone]    7.   What is your office fax number [phone] ATTN: VANESSA; EXT [id number]  8.   Anesthesia type (None, local, MAC, general) ? GENERAL   Julaine Hua 05/29/2019, 10:59 AM  _________________________________________________________________   (provider comments below)

## 2019-05-29 NOTE — Telephone Encounter (Signed)
Pt takes Eliquis for afib with CHADS2VASc score of 7 (age x2, sex, HTN, DM, stroke). Renal function is normal (SCr 1.04 at [facility] in July 2020).  Typically hold DOACs for 3 days prior to spinal procedures, however with patient's elevated cardiac risk and history of stroke, will route to MD for input.

## 2019-05-29 NOTE — Telephone Encounter (Signed)
Please make patient an appointment with an APP in the office for clearance.  Kerin Ransom PA-C 05/29/2019 2:54 PM

## 2019-05-29 NOTE — Telephone Encounter (Signed)
   Primary Cardiologist: Dr Stanford Breed  Chart reviewed as part of pre-operative protocol coverage. Because of Norita Davault Vernet's past medical history and time since last visit, he/she will require a follow-up visit in order to better assess preoperative cardiovascular risk.  Pre-op covering staff: - Please schedule appointment and call patient to inform them. - Please contact requesting surgeon's office via preferred method (i.e, phone, fax) to inform them of need for appointment prior to surgery.  If applicable, this message will also be routed to pharmacy pool and/or primary cardiologist for input on holding anticoagulant/antiplatelet agent as requested below so that this information is available at time of patient's appointment.   Kerin Ransom, PA-C  05/29/2019, 11:38 AM

## 2019-05-31 ENCOUNTER — Encounter: Payer: Self-pay | Admitting: Physician Assistant

## 2019-05-31 ENCOUNTER — Ambulatory Visit (INDEPENDENT_AMBULATORY_CARE_PROVIDER_SITE_OTHER): Payer: Medicare Other | Admitting: Physician Assistant

## 2019-05-31 ENCOUNTER — Other Ambulatory Visit: Payer: Self-pay

## 2019-05-31 VITALS — BP 166/97 | HR 71 | Temp 97.7°F | Ht 59.0 in | Wt 165.2 lb

## 2019-05-31 DIAGNOSIS — I482 Chronic atrial fibrillation, unspecified: Secondary | ICD-10-CM

## 2019-05-31 DIAGNOSIS — E785 Hyperlipidemia, unspecified: Secondary | ICD-10-CM | POA: Diagnosis not present

## 2019-05-31 DIAGNOSIS — R55 Syncope and collapse: Secondary | ICD-10-CM

## 2019-05-31 DIAGNOSIS — E119 Type 2 diabetes mellitus without complications: Secondary | ICD-10-CM

## 2019-05-31 DIAGNOSIS — I10 Essential (primary) hypertension: Secondary | ICD-10-CM | POA: Diagnosis not present

## 2019-05-31 DIAGNOSIS — Z01818 Encounter for other preprocedural examination: Secondary | ICD-10-CM | POA: Diagnosis not present

## 2019-05-31 NOTE — Progress Notes (Signed)
   Primary Cardiologist: Kirk Ruths, MD  [name] (DOB 1941/01/14)was seen in the cardiology office today as part of her preoperative clearance prior to the upcoming back surgery.  She does not have any history of CAD. EKG obtained today was personally reviewed and appears to be unchanged from last year.  She denies any anginal symptoms during exertion.  She is cleared to proceed with surgery without further work-up.  Her RCRI risk score is class II, 0.9% risk of major cardiac event.  She has been instructed to hold her Eliquis for 3 days prior to the surgery and restart as soon as possible after the surgery at surgeon's discretion based on bleeding risk.  Please call with questions.  [place], [place] 05/31/2019, 8:49 AM

## 2019-05-31 NOTE — Progress Notes (Signed)
Cardiology Office Note:    Date:  06/02/2019   ID:  [name], DOB 1940/10/12, MRN [id number]  PCP:  [name], MD  Cardiologist:  Natalie Ruths, MD  Electrophysiologist:  None   Referring MD: [name], MD   Chief Complaint  Patient presents with  . Pre-op Exam    upcoming surgery by Dr. Ronnald Ramp    History of Present Illness:    [name] is a 78 y.o. female with a hx of chronic atrial fibrillation, TIA, HTN, HLD, DM II, and history of large cell lymphoma.  Patient was initially diagnosed with atrial fibrillation when she was admitted to the hospital in August 2016 with TIA.  She was treated with Eliquis.  Echocardiogram obtained in August 2016 showed normal LV function, restrictive filling, mild tricuspid regurgitation and trace mitral regurgitation.  Carotid Doppler obtained in August 2016 showed mild disease bilaterally.  She had an episode of syncope in 2019 after taking Ambien.  This was felt to be related to medication.  She was last seen by Dr. Stanford Breed on 09/04/2018, heart rate was controlled on Toprol-XL.  Heart monitor was ordered to check for heart rate, this showed atrial fibrillation with PVCs or aberrantly conducted beats, otherwise rate controlled.  Patient presents today for preoperative clearance prior to upcoming lumbar laminectomy and fusion surgery by Dr. Sherley Bounds.  Talking with the patient, she has not been exercising as much due to the recent Covid pandemic.  However she denies any recent chest pain or shortness of breath.  She is able to do everyday household work without any exertional chest discomfort.  She is able to climb up 2 flight of stairs however requires some railing to hold onto.  Given the fact that she does not have any prior history of CAD and she does not have any anginal symptoms, no further work-up is recommended.  Her RCRI risk is class II, 0.9% risk of major cardiac event.  She can hold her Eliquis for 3 days prior to her surgery  and restart as soon as possible afterward at the discretion of Dr. Ronnald Ramp based on her bleeding risk.  Although her blood pressure is elevated today, she says she was in a rush to get to the clinic.  Previous blood pressure in March 2020 was normal.  I wished to bring the patient back in 6 weeks for another reassessment.  That will allow adequate time for her back to heal after the surgery.  During the meantime, she will keep a blood pressure diary.  She will also bring her home blood pressure cuff with her on the next visit.   Past Medical History:  Diagnosis Date  . Cancer ([place])   . Diabetes mellitus   . Fibromyalgia   . Hyperlipidemia   . Hypertension   . 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 Neuropathy   . PAF (paroxysmal atrial fibrillation) ([place])   . Stroke ([place])   . Urinary tract infection 09/19/11   Being treated at this time Cipro    Past Surgical History:  Procedure Laterality Date  . APPENDECTOMY    . CATARACT EXTRACTION  2015  . CHOLECYSTECTOMY    . JOINT REPLACEMENT    . JOINT REPLACEMENT    . ROTATOR CUFF REPAIR      Current Medications: Current Meds  Medication Sig  . apixaban (ELIQUIS) 5 MG TABS tablet Take 1 tablet (5 mg total) by mouth 2 (two) times daily.  . Cholecalciferol (VITAMIN  D PO) Take 1,000 Units by mouth daily.  Marland Kitchen ezetimibe (ZETIA) 10 MG tablet Take 1 tablet (10 mg total) by mouth daily. (Patient taking differently: Take 5 mg by mouth daily. For cholesterol)  . furosemide (LASIX) 20 MG tablet Take 1 tablet (20 mg total) by mouth every other day as needed for fluid or edema.  . gabapentin (NEURONTIN) 300 MG capsule Take 1 capsule (300 mg total) by mouth 3 (three) times daily.  . hydrochlorothiazide (HYDRODIURIL) 12.5 MG tablet Take 12.5 mg by mouth daily.  . metFORMIN (GLUCOPHAGE) 500 MG tablet Take 500 mg by mouth daily.  . metoprolol succinate (TOPROL XL) 25 MG 24 hr tablet Take 0.5 tablets (12.5 mg total) by mouth daily.  . Multiple  Vitamins-Minerals (CENTRUM SILVER PO) Take 1 tablet by mouth daily.  Marland Kitchen olmesartan (BENICAR) 40 MG tablet Take 1 tablet (40 mg total) by mouth daily.  . Omega-3 Fatty Acids (FISH OIL PO) Take 1,200 mg by mouth daily.  . pravastatin (PRAVACHOL) 20 MG tablet Take 1 tablet (20 mg total) by mouth daily.  . traMADol (ULTRAM) 50 MG tablet Take 1 tablet (50 mg total) by mouth every 6 (six) hours as needed. (Patient taking differently: Take 50 mg by mouth every 6 (six) hours as needed for moderate pain or severe pain. )     Allergies:   Patient has no known allergies.   Social History   Socioeconomic History  . Marital status: Married    Spouse name: [name]   . Number of children: 6  . Years of education: 99  . Highest education level: Not on file  Occupational History  . Occupation: Retired   Tobacco Use  . Smoking status: Never Smoker  . Smokeless tobacco: Never Used  Substance and Sexual Activity  . Alcohol use: Yes    Alcohol/week: 1.0 standard drinks    Types: 1 Glasses of wine per week    Comment: Rare  . Drug use: No  . Sexual activity: Not on file  Other Topics Concern  . Not on file  Social History Narrative   Lives at home with husband, [name].   Caffeine use: 2-3 cups coffee per day   Social Determinants of Health   Financial Resource Strain:   . Difficulty of Paying Living Expenses: Not on file  Food Insecurity:   . Worried About Charity fundraiser in the Last Year: Not on file  . Ran Out of Food in the Last Year: Not on file  Transportation Needs:   . Lack of Transportation (Medical): Not on file  . Lack of Transportation (Non-Medical): Not on file  Physical Activity:   . Days of Exercise per Week: Not on file  . Minutes of Exercise per Session: Not on file  Stress:   . Feeling of Stress : Not on file  Social Connections:   . Frequency of Communication with Friends and Family: Not on file  . Frequency of Social Gatherings with Friends and Family: Not on file  .  Attends Religious Services: Not on file  . Active Member of Clubs or Organizations: Not on file  . Attends Archivist Meetings: Not on file  . Marital Status: Not on file     Family History: The patient's family history includes Cancer in her sister; Heart Problems in her brother.  ROS:   Please see the history of present illness.     All other systems reviewed and are negative.  EKGs/Labs/Other Studies Reviewed:  The following studies were reviewed today:   EKG:  EKG is  ordered today.  The ekg ordered today demonstrates atrial fibrillation, nonspecific T wave changes  Recent Labs: No results found for requested labs within last 8760 hours.  Recent Lipid Panel    Component Value Date/Time   CHOL 182 02/17/2015 0535   TRIG 61 02/17/2015 0535   HDL 50 02/17/2015 0535   CHOLHDL 3.6 02/17/2015 0535   VLDL 12 02/17/2015 0535   LDLCALC 120 (H) 02/17/2015 0535    Physical Exam:    VS:  BP (!) 166/97   Pulse 71   Temp 97.7 F (36.5 C)   Ht 4\' 11"  (1.499 m)   Wt 165 lb 3.2 oz (74.9 kg)   BMI 33.37 kg/m     Wt Readings from Last 3 Encounters:  05/31/19 165 lb 3.2 oz (74.9 kg)  09/04/18 169 lb (76.7 kg)  08/17/18 169 lb (76.7 kg)     GEN:  Well nourished, well developed in no acute distress HEENT: Normal NECK: No JVD; No carotid bruits LYMPHATICS: No lymphadenopathy CARDIAC: Irregularly irregular, no murmurs, rubs, gallops RESPIRATORY:  Clear to auscultation without rales, wheezing or rhonchi  ABDOMEN: Soft, non-tender, non-distended MUSCULOSKELETAL:  No edema; No deformity  SKIN: Warm and dry NEUROLOGIC:  Alert and oriented x 3 PSYCHIATRIC:  Normal affect   ASSESSMENT:    1. Pre-procedural examination   2. Chronic atrial fibrillation (HCC)   3. Essential hypertension   4. Hyperlipidemia LDL goal <70   5. Controlled type 2 diabetes mellitus without complication, without long-term current use of insulin ([place])   6. Syncope, unspecified syncope type     PLAN:    In order of problems listed above:  1. Preoperative clearance: Upcoming back surgery by Dr. Ronnald Ramp.  Patient does not have a history of CAD.  She denies any recent chest discomfort.  She is cleared to proceed with back surgery without further work-up.  She does need to hold Eliquis for 3 days prior to the surgery and to restart as soon as possible afterward  2. Chronic atrial fibrillation: Rate controlled on metoprolol 12.5 mg daily.  Continue Eliquis  3. Hypertension: Blood pressure elevated today, however normally her blood pressure is very well controlled.  We will bring her back in 6 weeks after her back surgery for reassessment  4. Hyperlipidemia: Continue on pravastatin and Zetia  5. History of syncope: Occurred in 2019 after she took Ambien.  This has not recurred since.  6. DM2: Managed by primary care provider.   Medication Adjustments/Labs and Tests Ordered: Current medicines are reviewed at length with the patient today.  Concerns regarding medicines are outlined above.  Orders Placed This Encounter  Procedures  . EKG 12-Lead   No orders of the defined types were placed in this encounter.   Patient Instructions  Medication Instructions:   HOLD Eliquis for 3 days prior to surgery, RESTART at the discretion of the Surgeon   Your physician recommends that you continue on your current medications as directed. Please refer to the Current Medication list given to you today.  *If you need a refill on your cardiac medications before your next appointment, please call your pharmacy*  Lab Work: NONE ordered at this time of appointment   If you have labs (blood work) drawn today and your tests are completely normal, you will receive your results only by: Marland Kitchen MyChart Message (if you have MyChart) OR . A paper copy in the mail  If you have any lab test that is abnormal or we need to change your treatment, we will call you to review the  results.  Testing/Procedures: NONE ordered at this time of appointment   Follow-Up: At [facility], you and your health needs are our priority.  As part of our continuing mission to provide you with exceptional heart care, we have created designated Provider Care Teams.  These Care Teams include your primary Cardiologist (physician) and Advanced Practice Providers (APPs -  Physician Assistants and Nurse Practitioners) who all work together to provide you with the care you need, when you need it.  Your next appointment:   6 week(s)  The format for your next appointment:   In Person  Provider:   Kirk Ruths, MD or Natalie Deforest, PA-C  Other Instructions  Monitor blood pressure daily write daily readings on BP log  Bring in Blood pressure machine to follow up visit     Signed, [name], [place]  06/02/2019 11:25 PM    [place]

## 2019-05-31 NOTE — Patient Instructions (Signed)
Medication Instructions:   HOLD Eliquis for 3 days prior to surgery, RESTART at the discretion of the Surgeon   Your physician recommends that you continue on your current medications as directed. Please refer to the Current Medication list given to you today.  *If you need a refill on your cardiac medications before your next appointment, please call your pharmacy*  Lab Work: NONE ordered at this time of appointment   If you have labs (blood work) drawn today and your tests are completely normal, you will receive your results only by: Marland Kitchen MyChart Message (if you have MyChart) OR . A paper copy in the mail If you have any lab test that is abnormal or we need to change your treatment, we will call you to review the results.  Testing/Procedures: NONE ordered at this time of appointment   Follow-Up: At [facility], you and your health needs are our priority.  As part of our continuing mission to provide you with exceptional heart care, we have created designated Provider Care Teams.  These Care Teams include your primary Cardiologist (physician) and Advanced Practice Providers (APPs -  Physician Assistants and Nurse Practitioners) who all work together to provide you with the care you need, when you need it.  Your next appointment:   6 week(s)  The format for your next appointment:   In Person  Provider:   Kirk Ruths, MD or Almyra Deforest, PA-C  Other Instructions  Monitor blood pressure daily write daily readings on BP log  Bring in Blood pressure machine to follow up visit

## 2019-06-02 ENCOUNTER — Encounter: Payer: Self-pay | Admitting: Physician Assistant

## 2019-06-04 NOTE — Progress Notes (Signed)
[place], [place] [place] [place] [address] Phone: [phone] Fax: [phone]  [facility] [place], [place] [address] [place] [address] Phone: [phone] Fax: [phone]      Your procedure is scheduled on June 07, 2019.  Report to [facility] Main Entrance "A" at 8:50 A.M., and check in at the Admitting office.  Call this number if you have problems the morning of surgery:  [phone]  Call [phone] if you have any questions prior to your surgery date Monday-Friday 8am-4pm    Remember:  Do not eat or drink after midnight the night before your surgery   Take these medicines the morning of surgery with A SIP OF WATER: ezetimibe (ZETIA) gabapentin (NEURONTIN) metoprolol succinate (TOPROL XL) pravastatin (PRAVACHOL) traMADol (ULTRAM) - if needed  Follow your surgeon's instructions on when to stop Eliquis.  If no instructions were given by your surgeon then you will need to call the office to get those instructions.    As of today, STOP taking any Aspirin (unless otherwise instructed by your surgeon), Aleve, Naproxen, Ibuprofen, Motrin, Advil, Goody's, BC's, all herbal medications, fish oil, and all vitamins.   WHAT DO I DO ABOUT MY DIABETES MEDICATION?   Marland Kitchen Do not take oral diabetes medicines (pills) the morning of surgery.  HOW TO MANAGE YOUR DIABETES BEFORE AND AFTER SURGERY  Why is it important to control my blood sugar before and after surgery? . Improving blood sugar levels before and after surgery helps healing and can limit problems. . A way of improving blood sugar control is eating a healthy diet by: o  Eating less sugar and carbohydrates o  Increasing activity/exercise o  Talking with your doctor about reaching your blood sugar goals . High blood sugars (greater than 180 mg/dL) can raise your risk of infections and slow  your recovery, so you will need to focus on controlling your diabetes during the weeks before surgery. . Make sure that the doctor who takes care of your diabetes knows about your planned surgery including the date and location.  How do I manage my blood sugar before surgery? . Check your blood sugar at least 4 times a day, starting 2 days before surgery, to make sure that the level is not too high or low. . Check your blood sugar the morning of your surgery when you wake up and every 2 hours until you get to the Short Stay unit. o If your blood sugar is less than 70 mg/dL, you will need to treat for low blood sugar: - Do not take insulin. - Treat a low blood sugar (less than 70 mg/dL) with  cup of clear juice (cranberry or apple), 4 glucose tablets, OR glucose gel. - Recheck blood sugar in 15 minutes after treatment (to make sure it is greater than 70 mg/dL). If your blood sugar is not greater than 70 mg/dL on recheck, call [phone] for further instructions. . Report your blood sugar to the short stay nurse when you get to Short Stay.  . If you are admitted to the hospital after surgery: o Your blood sugar will be checked by the staff and you will probably be given insulin after surgery (instead of oral diabetes medicines) to make sure you have good blood sugar levels. o The goal for blood sugar control after surgery is 80-180 mg/dL.   The Morning of Surgery  Do not wear jewelry, make-up or nail polish.  Do not wear lotions, powders, or perfumes or deodorant  Do not shave 48 hours prior to surgery.    Do not bring valuables to the hospital.  [facility] is not responsible for any belongings or valuables.  If you are a smoker, DO NOT Smoke 24 hours prior to surgery  If you wear a CPAP at night please bring your mask, tubing, and machine the morning of surgery   Remember that you must have someone to transport you home after your surgery, and remain with you for 24 hours if you are  discharged the same day.   Please bring cases for contacts, glasses, hearing aids, dentures or bridgework because it cannot be worn into surgery.    Leave your suitcase in the car.  After surgery it may be brought to your room.  For patients admitted to the hospital, discharge time will be determined by your treatment team.  Patients discharged the day of surgery will not be allowed to drive home.    Special instructions:   [place]- Preparing For Surgery  Before surgery, you can play an important role. Because skin is not sterile, your skin needs to be as free of germs as possible. You can reduce the number of germs on your skin by washing with CHG (chlorahexidine gluconate) Soap before surgery.  CHG is an antiseptic cleaner which kills germs and bonds with the skin to continue killing germs even after washing.    Oral Hygiene is also important to reduce your risk of infection.  Remember - BRUSH YOUR TEETH THE MORNING OF SURGERY WITH YOUR REGULAR TOOTHPASTE  Please do not use if you have an allergy to CHG or antibacterial soaps. If your skin becomes reddened/irritated stop using the CHG.  Do not shave (including legs and underarms) for at least 48 hours prior to first CHG shower. It is OK to shave your face.  Please follow these instructions carefully.   1. Shower the NIGHT BEFORE SURGERY and the MORNING OF SURGERY with CHG Soap.   2. If you chose to wash your hair, wash your hair first as usual with your normal shampoo.  3. After you shampoo, rinse your hair and body thoroughly to remove the shampoo.  4. Use CHG as you would any other liquid soap. You can apply CHG directly to the skin and wash gently with a scrungie or a clean washcloth.   5. Apply the CHG Soap to your body ONLY FROM THE NECK DOWN.  Do not use on open wounds or open sores. Avoid contact with your eyes, ears, mouth and genitals (private parts). Wash Face and genitals (private parts)  with your normal soap.    6. Wash thoroughly, paying special attention to the area where your surgery will be performed.  7. Thoroughly rinse your body with warm water from the neck down.  8. DO NOT shower/wash with your normal soap after using and rinsing off the CHG Soap.  9. Pat yourself dry with a CLEAN TOWEL.  10. Wear CLEAN PAJAMAS to bed the night before surgery, wear comfortable clothes the morning of surgery  11. Place CLEAN SHEETS on your bed the night of your first shower and DO NOT SLEEP WITH PETS.    Day of Surgery:  Please shower the morning of surgery with the CHG soap Do not apply any deodorants/lotions. Please wear clean clothes to the hospital/surgery center.   Remember to brush your teeth WITH  YOUR REGULAR TOOTHPASTE.   Please read over the following fact sheets that you were given.

## 2019-06-05 ENCOUNTER — Encounter (HOSPITAL_COMMUNITY): Payer: Self-pay

## 2019-06-05 ENCOUNTER — Other Ambulatory Visit (HOSPITAL_COMMUNITY)
Admission: RE | Admit: 2019-06-05 | Discharge: 2019-06-05 | Disposition: A | Discharge status: Home / Self Care | Payer: Medicare Other | Source: Ambulatory Visit | Attending: Neurological Surgery | Admitting: Neurological Surgery

## 2019-06-05 ENCOUNTER — Other Ambulatory Visit: Payer: Self-pay

## 2019-06-05 ENCOUNTER — Encounter (HOSPITAL_COMMUNITY)
Admission: RE | Admit: 2019-06-05 | Discharge: 2019-06-05 | Disposition: A | Discharge status: Home / Self Care | Payer: Medicare Other | Source: Ambulatory Visit | Attending: Neurological Surgery | Admitting: Neurological Surgery

## 2019-06-05 ENCOUNTER — Ambulatory Visit (HOSPITAL_COMMUNITY)
Admission: RE | Admit: 2019-06-05 | Discharge: 2019-06-05 | Disposition: A | Discharge status: Home / Self Care | Payer: Medicare Other | Source: Ambulatory Visit | Attending: Neurological Surgery | Admitting: Neurological Surgery

## 2019-06-05 DIAGNOSIS — M48061 Spinal stenosis, lumbar region without neurogenic claudication: Secondary | ICD-10-CM

## 2019-06-05 HISTORY — DX: Fatty (change of) liver, not elsewhere classified: K76.0

## 2019-06-05 HISTORY — DX: Unspecified osteoarthritis, unspecified site: M19.90

## 2019-06-05 HISTORY — DX: Gastro-esophageal reflux disease without esophagitis: K21.9

## 2019-06-05 LAB — CBC WITH DIFFERENTIAL/PLATELET
Abs Immature Granulocytes: 0.02 10*3/uL (ref 0.00–0.07)
Basophils Absolute: 0 10*3/uL (ref 0.0–0.1)
Basophils Relative: 1 %
Eosinophils Absolute: 0.3 10*3/uL (ref 0.0–0.5)
Eosinophils Relative: 3 %
HCT: 47.4 % — ABNORMAL HIGH (ref 36.0–46.0)
Hemoglobin: 15.3 g/dL — ABNORMAL HIGH (ref 12.0–15.0)
Immature Granulocytes: 0 %
Lymphocytes Relative: 30 %
Lymphs Abs: 2.4 10*3/uL (ref 0.7–4.0)
MCH: 31 pg (ref 26.0–34.0)
MCHC: 32.3 g/dL (ref 30.0–36.0)
MCV: 96 fL (ref 80.0–100.0)
Monocytes Absolute: 0.7 10*3/uL (ref 0.1–1.0)
Monocytes Relative: 9 %
Neutro Abs: 4.5 10*3/uL (ref 1.7–7.7)
Neutrophils Relative %: 57 %
Platelets: 282 10*3/uL (ref 150–400)
RBC: 4.94 MIL/uL (ref 3.87–5.11)
RDW: 14 % (ref 11.5–15.5)
WBC: 7.8 10*3/uL (ref 4.0–10.5)
nRBC: 0 % (ref 0.0–0.2)

## 2019-06-05 LAB — SARS CORONAVIRUS 2 (TAT 6-24 HRS): SARS Coronavirus 2: NEGATIVE

## 2019-06-05 LAB — COMPREHENSIVE METABOLIC PANEL
ALT: 73 U/L — ABNORMAL HIGH (ref 0–44)
AST: 49 U/L — ABNORMAL HIGH (ref 15–41)
Albumin: 4.1 g/dL (ref 3.5–5.0)
Alkaline Phosphatase: 97 U/L (ref 38–126)
Anion gap: 13 (ref 5–15)
BUN: 21 mg/dL (ref 8–23)
CO2: 25 mmol/L (ref 22–32)
Calcium: 9.7 mg/dL (ref 8.9–10.3)
Chloride: 101 mmol/L (ref 98–111)
Creatinine, Ser: 1.08 mg/dL — ABNORMAL HIGH (ref 0.44–1.00)
GFR calc Af Amer: 57 mL/min — ABNORMAL LOW (ref >60)
GFR calc non Af Amer: 49 mL/min — ABNORMAL LOW (ref >60)
Glucose, Bld: 124 mg/dL — ABNORMAL HIGH (ref 70–99)
Potassium: 4 mmol/L (ref 3.5–5.1)
Sodium: 139 mmol/L (ref 135–145)
Total Bilirubin: 1.5 mg/dL — ABNORMAL HIGH (ref 0.3–1.2)
Total Protein: 7.5 g/dL (ref 6.5–8.1)

## 2019-06-05 LAB — GLUCOSE, CAPILLARY: Glucose-Capillary: 107 mg/dL — ABNORMAL HIGH (ref 70–99)

## 2019-06-05 LAB — PROTIME-INR
INR: 1.1 (ref 0.8–1.2)
Prothrombin Time: 13.6 seconds (ref 11.4–15.2)

## 2019-06-05 LAB — SURGICAL PCR SCREEN
MRSA, PCR: POSITIVE — AB
Staphylococcus aureus: POSITIVE — AB

## 2019-06-05 NOTE — Progress Notes (Signed)
PCP - Dr. [name] Cardiologist - Dr. Kirk Ruths  PPM/ICD - denies Device Orders - N/A Rep Notified - N/A  Chest x-ray - 06/05/2019 EKG - 05/31/2019  Stress Test - per patient about 12-15 years ago ECHO - 02/17/15 Cardiac Cath - per patient about 12 - 15 years ago  Sleep Study - denies  CPAP - N/A  Fasting Blood Sugar - 100 - 150  Checks Blood Sugar 1 time a day  Blood Thinner Instructions: Eliquis: per Dr. Stanford Breed note, hold for 3 days prior to her surgery and restart as soon as possible afterward at the discretion of Dr. Ronnald Ramp based on her bleeding risk. Per patient, LD: 06/03/2019 Aspirin Instructions: N/A  ERAS Protcol -No PRE-SURGERY Ensure or G2- N/a  COVID TEST- Scheduled today after PAT appointment 06/05/2019. Patient verbalized understanding of self-quarantine instructions, appointment time and place.  Anesthesia review: YES, heart hx, cardiac clearance, stroke hx  Patient denies shortness of breath, fever, cough and chest pain at PAT appointment  All instructions explained to the patient, with a verbal understanding of the material. Patient agrees to go over the instructions while at home for a better understanding. Patient also instructed to self quarantine after being tested for COVID-19. The opportunity to ask questions was provided.

## 2019-06-06 MED ORDER — CEFAZOLIN SODIUM-DEXTROSE 2-4 GM/100ML-% IV SOLN
2.0000 g | INTRAVENOUS | Status: AC
  Administered 2019-06-07: 08:00:00 2 g via INTRAVENOUS
  Filled 2019-06-06: qty 100

## 2019-06-06 MED ORDER — VANCOMYCIN HCL IN DEXTROSE 1-5 GM/200ML-% IV SOLN
1000.0000 mg | INTRAVENOUS | Status: AC
  Administered 2019-06-07: 1000 mg via INTRAVENOUS
  Filled 2019-06-06: qty 200

## 2019-06-06 NOTE — Anesthesia Preprocedure Evaluation (Addendum)
Anesthesia Evaluation  Patient identified by MRN, date of birth, ID band Patient awake    Reviewed: Allergy & Precautions, NPO status , Patient's Chart, lab work & pertinent test results, reviewed documented beta blocker date and time   Airway Mallampati: II  TM Distance: >3 FB Neck ROM: Full    Dental  (+) Edentulous Upper   Pulmonary neg pulmonary ROS,    Pulmonary exam normal breath sounds clear to auscultation       Cardiovascular hypertension, Pt. on medications and Pt. on home beta blockers Normal cardiovascular exam+ dysrhythmias Atrial Fibrillation  Rhythm:Regular Rate:Normal     Neuro/Psych TIAnegative psych ROS   GI/Hepatic Neg liver ROS, GERD  ,  Endo/Other  negative endocrine ROSdiabetes, Type 2  Renal/GU negative Renal ROS  negative genitourinary   Musculoskeletal  (+) Arthritis , Osteoarthritis,  Fibromyalgia -  Abdominal (+) + obese,   Peds negative pediatric ROS (+)  Hematology negative hematology ROS (+)   Anesthesia Other Findings   Reproductive/Obstetrics negative OB ROS                           Anesthesia Physical Anesthesia Plan  ASA: III  Anesthesia Plan: General   Post-op Pain Management:    Induction: Intravenous  PONV Risk Score and Plan: 3 and Ondansetron, Dexamethasone, Midazolam and Treatment may vary due to age or medical condition  Airway Management Planned: Oral ETT  Additional Equipment:   Intra-op Plan:   Post-operative Plan: Extubation in OR  Informed Consent: I have reviewed the patients History and Physical, chart, labs and discussed the procedure including the risks, benefits and alternatives for the proposed anesthesia with the patient or authorized representative who has indicated his/her understanding and acceptance.     Dental advisory given  Plan Discussed with: CRNA  Anesthesia Plan Comments: (Follows with cardiology for hx of  chronic atrial fibrillation, TIA, HTN, HLD. Patient was initially diagnosed with atrial fibrillation when she was admitted to the hospital in August 2016 with TIA.  She was treated with Eliquis.  Echo 2016 showed normal LV function, restrictive filling, mild tricuspid regurgitation and trace mitral regurgitation. Carotid Doppler 2016 showed mild disease bilaterally. Recent event monitor May 2020 showed rate controlled afib.   Pt was seen 05/31/19 for preop clearance. Per note, "Patient presents today for preoperative clearance prior to upcoming lumbar laminectomy and fusion surgery by Dr. Sherley Bounds.  Talking with the patient, she has not been exercising as much due to the recent Covid pandemic.  However she denies any recent chest pain or shortness of breath.  She is able to do everyday household work without any exertional chest discomfort.  She is able to climb up 2 flight of stairs however requires some railing to hold onto.  Given the fact that she does not have any prior history of CAD and she does not have any anginal symptoms, no further work-up is recommended.  Her RCRI risk is class II, 0.9% risk of major cardiac event.  She can hold her Eliquis for 3 days prior to her surgery and restart as soon as possible afterward at the discretion of Dr. Ronnald Ramp based on her bleeding risk."  Hx of fatty liver and mild intermittent transaminase elevations. Preop labs show AST 49 and ALT 73. Labs otherwise unremarkable.  EKG 05/31/19: Afib. Vent rate 71. LAD. Nonspecific T wave changes (read per Hao Meng's note 05/31/19)  Event monitor 10/29/18: Atrial fibrillation with PVCs or  aberrantly conducted beats, rate controlled.  TTE 02/17/15: - Left ventricle: The cavity size was normal. Systolic function was   normal. The estimated ejection fraction was in the range of 50%   to 55%. Wall motion was normal; there were no regional wall   motion abnormalities. There was a reduced contribution of atrial    contraction to ventricular filling, due to increased ventricular   diastolic pressure or atrial contractile dysfunction. Doppler   parameters are consistent with a reversible restrictive pattern,   indicative of decreased left ventricular diastolic compliance   and/or increased left atrial pressure (grade 3 diastolic   dysfunction). - Aortic valve: Moderately calcified annulus. Trileaflet.   Thickening. Mild focal thickening and calcification. - Mitral valve: There was trivial regurgitation. - Tricuspid valve: There was mild regurgitation. - Pulmonary arteries: PA peak pressure: 32 mm Hg (S).)       Anesthesia Quick Evaluation

## 2019-06-06 NOTE — Progress Notes (Signed)
Anesthesia Chart Review:  Follows with cardiology for hx of chronic atrial fibrillation, TIA, HTN, HLD. Patient was initially diagnosed with atrial fibrillation when she was admitted to the hospital in August 2016 with TIA.  She was treated with Eliquis.  Echo 2016 showed normal LV function, restrictive filling, mild tricuspid regurgitation and trace mitral regurgitation. Carotid Doppler 2016 showed mild disease bilaterally. Recent event monitor May 2020 showed rate controlled afib.   Pt was seen 05/31/19 for preop clearance. Per note, "Patient presents today for preoperative clearance prior to upcoming lumbar laminectomy and fusion surgery by Dr. Sherley Bounds.  Talking with the patient, she has not been exercising as much due to the recent Covid pandemic.  However she denies any recent chest pain or shortness of breath.  She is able to do everyday household work without any exertional chest discomfort.  She is able to climb up 2 flight of stairs however requires some railing to hold onto.  Given the fact that she does not have any prior history of CAD and she does not have any anginal symptoms, no further work-up is recommended.  Her RCRI risk is class II, 0.9% risk of major cardiac event.  She can hold her Eliquis for 3 days prior to her surgery and restart as soon as possible afterward at the discretion of Dr. Ronnald Ramp based on her bleeding risk."  Hx of fatty liver and mild intermittent transaminase elevations. Preop labs show AST 49 and ALT 73. Labs otherwise unremarkable.  EKG 05/31/19: Afib. Vent rate 71. LAD. Nonspecific T wave changes (read per Hao Meng's note 05/31/19)  Event monitor 10/29/18: Atrial fibrillation with PVCs or aberrantly conducted beats, rate controlled.  TTE 02/17/15: - Left ventricle: The cavity size was normal. Systolic function was   normal. The estimated ejection fraction was in the range of 50%   to 55%. Wall motion was normal; there were no regional wall   motion  abnormalities. There was a reduced contribution of atrial   contraction to ventricular filling, due to increased ventricular   diastolic pressure or atrial contractile dysfunction. Doppler   parameters are consistent with a reversible restrictive pattern,   indicative of decreased left ventricular diastolic compliance   and/or increased left atrial pressure (grade 3 diastolic   dysfunction). - Aortic valve: Moderately calcified annulus. Trileaflet.   Thickening. Mild focal thickening and calcification. - Mitral valve: There was trivial regurgitation. - Tricuspid valve: There was mild regurgitation. - Pulmonary arteries: PA peak pressure: 32 mm Hg (S).   [facility] Short Stay Center/Anesthesiology Phone [phone] 06/06/2019 11:13 AM

## 2019-06-07 ENCOUNTER — Other Ambulatory Visit: Payer: Self-pay

## 2019-06-07 ENCOUNTER — Encounter (HOSPITAL_COMMUNITY): Payer: Self-pay | Admitting: Neurological Surgery

## 2019-06-07 ENCOUNTER — Inpatient Hospital Stay (HOSPITAL_COMMUNITY)
Admission: RE | Admit: 2019-06-07 | Discharge: 2019-06-08 | DRG: 460 | Disposition: A | Discharge status: Home / Self Care | Payer: Medicare Other | Attending: Neurological Surgery | Admitting: Neurological Surgery

## 2019-06-07 ENCOUNTER — Inpatient Hospital Stay (HOSPITAL_COMMUNITY): Payer: Medicare Other | Admitting: Vascular Surgery

## 2019-06-07 ENCOUNTER — Inpatient Hospital Stay (HOSPITAL_COMMUNITY): Payer: Medicare Other | Admitting: Anesthesiology

## 2019-06-07 ENCOUNTER — Inpatient Hospital Stay (HOSPITAL_COMMUNITY): Payer: Medicare Other

## 2019-06-07 ENCOUNTER — Encounter (HOSPITAL_COMMUNITY)
Admission: RE | Disposition: A | Discharge status: Home / Self Care | Payer: Self-pay | Source: Home / Self Care | Attending: Neurological Surgery

## 2019-06-07 DIAGNOSIS — I10 Essential (primary) hypertension: Secondary | ICD-10-CM | POA: Diagnosis present

## 2019-06-07 DIAGNOSIS — Z7984 Long term (current) use of oral hypoglycemic drugs: Secondary | ICD-10-CM | POA: Diagnosis not present

## 2019-06-07 DIAGNOSIS — Z7901 Long term (current) use of anticoagulants: Secondary | ICD-10-CM

## 2019-06-07 DIAGNOSIS — M48061 Spinal stenosis, lumbar region without neurogenic claudication: Secondary | ICD-10-CM | POA: Diagnosis not present

## 2019-06-07 DIAGNOSIS — M1990 Unspecified osteoarthritis, unspecified site: Secondary | ICD-10-CM | POA: Diagnosis not present

## 2019-06-07 DIAGNOSIS — E119 Type 2 diabetes mellitus without complications: Secondary | ICD-10-CM | POA: Diagnosis not present

## 2019-06-07 DIAGNOSIS — M4316 Spondylolisthesis, lumbar region: Secondary | ICD-10-CM | POA: Diagnosis not present

## 2019-06-07 DIAGNOSIS — E669 Obesity, unspecified: Secondary | ICD-10-CM | POA: Diagnosis not present

## 2019-06-07 DIAGNOSIS — Z8572 Personal history of non-Hodgkin lymphomas: Secondary | ICD-10-CM | POA: Diagnosis not present

## 2019-06-07 DIAGNOSIS — Z6832 Body mass index (BMI) 32.0-32.9, adult: Secondary | ICD-10-CM

## 2019-06-07 DIAGNOSIS — Z8673 Personal history of transient ischemic attack (TIA), and cerebral infarction without residual deficits: Secondary | ICD-10-CM

## 2019-06-07 DIAGNOSIS — Z9889 Other specified postprocedural states: Secondary | ICD-10-CM

## 2019-06-07 DIAGNOSIS — Z419 Encounter for procedure for purposes other than remedying health state, unspecified: Secondary | ICD-10-CM

## 2019-06-07 DIAGNOSIS — K219 Gastro-esophageal reflux disease without esophagitis: Secondary | ICD-10-CM | POA: Diagnosis not present

## 2019-06-07 DIAGNOSIS — Z01812 Encounter for preprocedural laboratory examination: Secondary | ICD-10-CM

## 2019-06-07 DIAGNOSIS — Z79899 Other long term (current) drug therapy: Secondary | ICD-10-CM | POA: Diagnosis not present

## 2019-06-07 DIAGNOSIS — M797 Fibromyalgia: Secondary | ICD-10-CM | POA: Diagnosis present

## 2019-06-07 DIAGNOSIS — E785 Hyperlipidemia, unspecified: Secondary | ICD-10-CM | POA: Diagnosis present

## 2019-06-07 DIAGNOSIS — Z20828 Contact with and (suspected) exposure to other viral communicable diseases: Secondary | ICD-10-CM | POA: Diagnosis present

## 2019-06-07 HISTORY — PX: LAMINECTOMY WITH POSTERIOR LATERAL ARTHRODESIS LEVEL 2: SHX6336

## 2019-06-07 LAB — HEMOGLOBIN A1C
Hgb A1c MFr Bld: 6.2 % — ABNORMAL HIGH (ref 4.8–5.6)
Mean Plasma Glucose: 131.24 mg/dL

## 2019-06-07 LAB — GLUCOSE, CAPILLARY
Glucose-Capillary: 129 mg/dL — ABNORMAL HIGH (ref 70–99)
Glucose-Capillary: 118 mg/dL — ABNORMAL HIGH (ref 70–99)
Glucose-Capillary: 190 mg/dL — ABNORMAL HIGH (ref 70–99)
Glucose-Capillary: 173 mg/dL — ABNORMAL HIGH (ref 70–99)

## 2019-06-07 SURGERY — LAMINECTOMY WITH POSTERIOR LATERAL ARTHRODESIS LEVEL 2
Anesthesia: General | Site: Back

## 2019-06-07 MED ORDER — PHENYLEPHRINE HCL-NACL 10-0.9 MG/250ML-% IV SOLN
INTRAVENOUS | Status: DC | PRN
  Administered 2019-06-07: 25 ug/min via INTRAVENOUS

## 2019-06-07 MED ORDER — IRBESARTAN 75 MG PO TABS
37.5000 mg | ORAL_TABLET | Freq: Every day | ORAL | Status: DC
  Administered 2019-06-07: 37.5 mg via ORAL
  Filled 2019-06-07 (×2): qty 0.5

## 2019-06-07 MED ORDER — METHOCARBAMOL 1000 MG/10ML IJ SOLN
500.0000 mg | Freq: Four times a day (QID) | INTRAVENOUS | Status: DC | PRN
  Filled 2019-06-07: qty 5

## 2019-06-07 MED ORDER — PHENOL 1.4 % MT LIQD: 1.0000 | OROMUCOSAL | Status: DC | PRN

## 2019-06-07 MED ORDER — EZETIMIBE 10 MG PO TABS
5.0000 mg | ORAL_TABLET | Freq: Every day | ORAL | Status: DC
  Administered 2019-06-07: 5 mg via ORAL
  Filled 2019-06-07 (×2): qty 0.5

## 2019-06-07 MED ORDER — PROPOFOL 10 MG/ML IV BOLUS
INTRAVENOUS | Status: DC | PRN
  Administered 2019-06-07: 120 mg via INTRAVENOUS

## 2019-06-07 MED ORDER — PROMETHAZINE HCL 25 MG/ML IJ SOLN: 6.2500 mg | INTRAMUSCULAR | Status: DC | PRN

## 2019-06-07 MED ORDER — VITAMIN D 25 MCG (1000 UNIT) PO TABS
1000.0000 [IU] | ORAL_TABLET | Freq: Every day | ORAL | Status: DC
  Administered 2019-06-07: 1000 [IU] via ORAL
  Filled 2019-06-07: qty 1

## 2019-06-07 MED ORDER — MIDAZOLAM HCL 2 MG/2ML IJ SOLN
INTRAMUSCULAR | Status: AC
  Filled 2019-06-07: qty 2

## 2019-06-07 MED ORDER — SODIUM CHLORIDE 0.9% FLUSH: 3.0000 mL | INTRAVENOUS | Status: DC | PRN

## 2019-06-07 MED ORDER — SODIUM CHLORIDE 0.9 % IV SOLN
INTRAVENOUS | Status: DC | PRN
  Administered 2019-06-07: 500 mL

## 2019-06-07 MED ORDER — ACETAMINOPHEN 650 MG RE SUPP: 650.0000 mg | RECTAL | Status: DC | PRN

## 2019-06-07 MED ORDER — SODIUM CHLORIDE (PF) 0.9 % IJ SOLN
INTRAMUSCULAR | Status: DC | PRN
  Administered 2019-06-07: 5 mL via INTRAVENOUS

## 2019-06-07 MED ORDER — LACTATED RINGERS IV SOLN: INTRAVENOUS | Status: DC

## 2019-06-07 MED ORDER — OXYCODONE HCL 5 MG PO TABS: 5.0000 mg | ORAL_TABLET | Freq: Once | ORAL | Status: AC | PRN

## 2019-06-07 MED ORDER — METHOCARBAMOL 500 MG PO TABS: 500.0000 mg | ORAL_TABLET | Freq: Four times a day (QID) | ORAL | Status: DC | PRN

## 2019-06-07 MED ORDER — MENTHOL 3 MG MT LOZG: 1.0000 | LOZENGE | OROMUCOSAL | Status: DC | PRN

## 2019-06-07 MED ORDER — POTASSIUM CHLORIDE IN NACL 20-0.9 MEQ/L-% IV SOLN: INTRAVENOUS | Status: DC

## 2019-06-07 MED ORDER — HEPARIN SODIUM (PORCINE) 1000 UNIT/ML IJ SOLN
INTRAMUSCULAR | Status: AC
  Filled 2019-06-07: qty 1

## 2019-06-07 MED ORDER — THROMBIN 20000 UNITS EX SOLR
CUTANEOUS | Status: DC | PRN
  Administered 2019-06-07: 08:00:00 20 mL via TOPICAL

## 2019-06-07 MED ORDER — THROMBIN 5000 UNITS EX SOLR
OROMUCOSAL | Status: DC | PRN
  Administered 2019-06-07: 08:00:00 5 mL via TOPICAL

## 2019-06-07 MED ORDER — HEPARIN SODIUM (PORCINE) 1000 UNIT/ML IJ SOLN
INTRAMUSCULAR | Status: DC | PRN
  Administered 2019-06-07: 5000 [IU]

## 2019-06-07 MED ORDER — DEXAMETHASONE SODIUM PHOSPHATE 10 MG/ML IJ SOLN
10.0000 mg | Freq: Once | INTRAMUSCULAR | Status: AC
  Administered 2019-06-07: 10 mg via INTRAVENOUS
  Filled 2019-06-07: qty 1

## 2019-06-07 MED ORDER — METHOCARBAMOL 500 MG PO TABS
ORAL_TABLET | ORAL | Status: AC
  Filled 2019-06-07: qty 1

## 2019-06-07 MED ORDER — INSULIN ASPART 100 UNIT/ML ~~LOC~~ SOLN
0.0000 [IU] | Freq: Three times a day (TID) | SUBCUTANEOUS | Status: DC
  Administered 2019-06-07: 3 [IU] via SUBCUTANEOUS
  Administered 2019-06-08: 2 [IU] via SUBCUTANEOUS

## 2019-06-07 MED ORDER — ONDANSETRON HCL 4 MG/2ML IJ SOLN
4.0000 mg | Freq: Four times a day (QID) | INTRAMUSCULAR | Status: DC | PRN
  Administered 2019-06-07: 20:00:00 4 mg via INTRAVENOUS
  Filled 2019-06-07: qty 2

## 2019-06-07 MED ORDER — CEFAZOLIN SODIUM-DEXTROSE 2-4 GM/100ML-% IV SOLN
2.0000 g | Freq: Three times a day (TID) | INTRAVENOUS | Status: AC
  Administered 2019-06-07 (×2): 2 g via INTRAVENOUS
  Filled 2019-06-07 (×2): qty 100

## 2019-06-07 MED ORDER — FENTANYL CITRATE (PF) 250 MCG/5ML IJ SOLN
INTRAMUSCULAR | Status: AC
  Filled 2019-06-07: qty 5

## 2019-06-07 MED ORDER — ONDANSETRON HCL 4 MG/2ML IJ SOLN
INTRAMUSCULAR | Status: DC | PRN
  Administered 2019-06-07: 4 mg via INTRAVENOUS

## 2019-06-07 MED ORDER — HYDROMORPHONE HCL 1 MG/ML IJ SOLN
0.2500 mg | INTRAMUSCULAR | Status: DC | PRN
  Administered 2019-06-07: 0.5 mg via INTRAVENOUS

## 2019-06-07 MED ORDER — BUPIVACAINE HCL (PF) 0.25 % IJ SOLN
INTRAMUSCULAR | Status: AC
  Filled 2019-06-07: qty 30

## 2019-06-07 MED ORDER — THROMBIN 5000 UNITS EX SOLR
CUTANEOUS | Status: AC
  Filled 2019-06-07: qty 5000

## 2019-06-07 MED ORDER — SUGAMMADEX SODIUM 200 MG/2ML IV SOLN
INTRAVENOUS | Status: DC | PRN
  Administered 2019-06-07: 100 mg via INTRAVENOUS
  Administered 2019-06-07: 200 mg via INTRAVENOUS

## 2019-06-07 MED ORDER — ARTHREX ANGEL - ACD-A SOLUTION (CHARTING ONLY) OPTIME
TOPICAL | Status: DC | PRN
  Administered 2019-06-07: 08:00:00 10 mL via TOPICAL

## 2019-06-07 MED ORDER — OXYCODONE HCL 5 MG PO TABS
ORAL_TABLET | ORAL | Status: AC
  Administered 2019-06-07: 5 mg via ORAL
  Filled 2019-06-07: qty 1

## 2019-06-07 MED ORDER — BUPIVACAINE HCL (PF) 0.25 % IJ SOLN
INTRAMUSCULAR | Status: DC | PRN
  Administered 2019-06-07: 8 mL

## 2019-06-07 MED ORDER — HYDROMORPHONE HCL 1 MG/ML IJ SOLN
INTRAMUSCULAR | Status: AC
  Administered 2019-06-07: 0.25 mg via INTRAVENOUS
  Filled 2019-06-07: qty 1

## 2019-06-07 MED ORDER — FENTANYL CITRATE (PF) 250 MCG/5ML IJ SOLN
INTRAMUSCULAR | Status: DC | PRN
  Administered 2019-06-07 (×3): 50 ug via INTRAVENOUS

## 2019-06-07 MED ORDER — PROPOFOL 10 MG/ML IV BOLUS
INTRAVENOUS | Status: AC
  Filled 2019-06-07: qty 40

## 2019-06-07 MED ORDER — ONDANSETRON HCL 4 MG PO TABS: 4.0000 mg | ORAL_TABLET | Freq: Four times a day (QID) | ORAL | Status: DC | PRN

## 2019-06-07 MED ORDER — CHLORHEXIDINE GLUCONATE CLOTH 2 % EX PADS: 6.0000 | MEDICATED_PAD | Freq: Once | CUTANEOUS | Status: DC

## 2019-06-07 MED ORDER — SODIUM CHLORIDE 0.9% FLUSH
3.0000 mL | Freq: Two times a day (BID) | INTRAVENOUS | Status: DC
  Administered 2019-06-07: 3 mL via INTRAVENOUS

## 2019-06-07 MED ORDER — ROCURONIUM BROMIDE 10 MG/ML (PF) SYRINGE
PREFILLED_SYRINGE | INTRAVENOUS | Status: AC
  Filled 2019-06-07: qty 10

## 2019-06-07 MED ORDER — MIDAZOLAM HCL 2 MG/2ML IJ SOLN
INTRAMUSCULAR | Status: DC | PRN
  Administered 2019-06-07 (×2): 1 mg via INTRAVENOUS

## 2019-06-07 MED ORDER — ACETAMINOPHEN 325 MG PO TABS: 650.0000 mg | ORAL_TABLET | ORAL | Status: DC | PRN

## 2019-06-07 MED ORDER — METOPROLOL SUCCINATE ER 25 MG PO TB24
12.5000 mg | ORAL_TABLET | Freq: Every day | ORAL | Status: DC
  Administered 2019-06-07: 12.5 mg via ORAL

## 2019-06-07 MED ORDER — FUROSEMIDE 20 MG PO TABS: 20.0000 mg | ORAL_TABLET | ORAL | Status: DC | PRN

## 2019-06-07 MED ORDER — 0.9 % SODIUM CHLORIDE (POUR BTL) OPTIME
TOPICAL | Status: DC | PRN
  Administered 2019-06-07: 07:00:00 1000 mL

## 2019-06-07 MED ORDER — OXYCODONE HCL 5 MG/5ML PO SOLN: 5.0000 mg | Freq: Once | ORAL | Status: AC | PRN

## 2019-06-07 MED ORDER — HYDROCODONE-ACETAMINOPHEN 7.5-325 MG PO TABS
1.0000 | ORAL_TABLET | Freq: Four times a day (QID) | ORAL | Status: DC
  Administered 2019-06-07 – 2019-06-08 (×4): 1 via ORAL
  Filled 2019-06-07 (×4): qty 1

## 2019-06-07 MED ORDER — SODIUM CHLORIDE 0.9 % IV SOLN: 250.0000 mL | INTRAVENOUS | Status: DC

## 2019-06-07 MED ORDER — GABAPENTIN 300 MG PO CAPS
300.0000 mg | ORAL_CAPSULE | Freq: Three times a day (TID) | ORAL | Status: DC
  Administered 2019-06-07 (×2): 300 mg via ORAL
  Filled 2019-06-07 (×2): qty 1

## 2019-06-07 MED ORDER — THROMBIN 20000 UNITS EX SOLR
CUTANEOUS | Status: AC
  Filled 2019-06-07: qty 20000

## 2019-06-07 MED ORDER — MORPHINE SULFATE (PF) 2 MG/ML IV SOLN: 2.0000 mg | INTRAVENOUS | Status: DC | PRN

## 2019-06-07 MED ORDER — LIDOCAINE 2% (20 MG/ML) 5 ML SYRINGE
INTRAMUSCULAR | Status: AC
  Filled 2019-06-07: qty 5

## 2019-06-07 MED ORDER — ONDANSETRON HCL 4 MG/2ML IJ SOLN
INTRAMUSCULAR | Status: AC
  Filled 2019-06-07: qty 2

## 2019-06-07 MED ORDER — ROCURONIUM BROMIDE 10 MG/ML (PF) SYRINGE
PREFILLED_SYRINGE | INTRAVENOUS | Status: DC | PRN
  Administered 2019-06-07: 10 mg via INTRAVENOUS
  Administered 2019-06-07: 50 mg via INTRAVENOUS

## 2019-06-07 MED ORDER — HYDROCHLOROTHIAZIDE 25 MG PO TABS
12.5000 mg | ORAL_TABLET | Freq: Every day | ORAL | Status: DC
  Administered 2019-06-07: 15:00:00 12.5 mg via ORAL
  Filled 2019-06-07: qty 1

## 2019-06-07 MED ORDER — LIDOCAINE 2% (20 MG/ML) 5 ML SYRINGE
INTRAMUSCULAR | Status: DC | PRN
  Administered 2019-06-07: 80 mg via INTRAVENOUS

## 2019-06-07 MED ORDER — SENNA 8.6 MG PO TABS
1.0000 | ORAL_TABLET | Freq: Two times a day (BID) | ORAL | Status: DC
  Administered 2019-06-07 (×2): 8.6 mg via ORAL
  Filled 2019-06-07 (×2): qty 1

## 2019-06-07 MED ORDER — CELECOXIB 200 MG PO CAPS
200.0000 mg | ORAL_CAPSULE | Freq: Two times a day (BID) | ORAL | Status: DC
  Administered 2019-06-07 (×2): 200 mg via ORAL
  Filled 2019-06-07 (×2): qty 1

## 2019-06-07 MED ORDER — METFORMIN HCL 500 MG PO TABS
500.0000 mg | ORAL_TABLET | Freq: Every day | ORAL | Status: DC
  Administered 2019-06-08: 500 mg via ORAL
  Filled 2019-06-07: qty 1

## 2019-06-07 SURGICAL SUPPLY — 54 items
ADH SKN CLS APL DERMABOND .7 (GAUZE/BANDAGES/DRESSINGS) ×1
APL SKNCLS STERI-STRIP NONHPOA (GAUZE/BANDAGES/DRESSINGS) ×1
BAG DECANTER FOR FLEXI CONT (MISCELLANEOUS) ×2 IMPLANT
BASKET BONE COLLECTION (BASKET) ×1 IMPLANT
BENZOIN TINCTURE PRP APPL 2/3 (GAUZE/BANDAGES/DRESSINGS) ×2 IMPLANT
BLADE CLIPPER SURG (BLADE) IMPLANT
BUR CARBIDE MATCH 3.0 (BURR) ×2 IMPLANT
CANISTER SUCT 3000ML PPV (MISCELLANEOUS) ×2 IMPLANT
CARTRIDGE OIL MAESTRO DRILL (MISCELLANEOUS) ×1 IMPLANT
CONT SPEC 4OZ CLIKSEAL STRL BL (MISCELLANEOUS) ×2 IMPLANT
COVER BACK TABLE 60X90IN (DRAPES) ×2 IMPLANT
COVER WAND RF STERILE (DRAPES) ×2 IMPLANT
DERMABOND ADVANCED (GAUZE/BANDAGES/DRESSINGS) ×1
DERMABOND ADVANCED .7 DNX12 (GAUZE/BANDAGES/DRESSINGS) IMPLANT
DIFFUSER DRILL AIR PNEUMATIC (MISCELLANEOUS) ×2 IMPLANT
DRAPE C-ARM 42X72 X-RAY (DRAPES) ×1 IMPLANT
DRAPE LAPAROTOMY 100X72X124 (DRAPES) ×2 IMPLANT
DRAPE SURG 17X23 STRL (DRAPES) ×2 IMPLANT
DURAPREP 26ML APPLICATOR (WOUND CARE) ×2 IMPLANT
ELECT REM PT RETURN 9FT ADLT (ELECTROSURGICAL) ×2
ELECTRODE REM PT RTRN 9FT ADLT (ELECTROSURGICAL) ×1 IMPLANT
EVACUATOR 1/8 PVC DRAIN (DRAIN) IMPLANT
GAUZE 4X4 16PLY RFD (DISPOSABLE) IMPLANT
GLOVE BIO SURGEON STRL SZ7 (GLOVE) IMPLANT
GLOVE BIO SURGEON STRL SZ8 (GLOVE) ×4 IMPLANT
GLOVE BIOGEL PI IND STRL 7.0 (GLOVE) IMPLANT
GLOVE BIOGEL PI INDICATOR 7.0 (GLOVE)
GOWN STRL REUS W/ TWL LRG LVL3 (GOWN DISPOSABLE) IMPLANT
GOWN STRL REUS W/ TWL XL LVL3 (GOWN DISPOSABLE) ×2 IMPLANT
GOWN STRL REUS W/TWL 2XL LVL3 (GOWN DISPOSABLE) IMPLANT
GOWN STRL REUS W/TWL LRG LVL3 (GOWN DISPOSABLE)
GOWN STRL REUS W/TWL XL LVL3 (GOWN DISPOSABLE) ×4
HEMOSTAT POWDER KIT SURGIFOAM (HEMOSTASIS) ×1 IMPLANT
KIT BASIN OR (CUSTOM PROCEDURE TRAY) ×2 IMPLANT
KIT BONE MRW ASP ANGEL CPRP (KITS) ×1 IMPLANT
KIT TURNOVER KIT B (KITS) ×2 IMPLANT
NDL HYPO 25X1 1.5 SAFETY (NEEDLE) ×1 IMPLANT
NEEDLE HYPO 25X1 1.5 SAFETY (NEEDLE) ×2 IMPLANT
NS IRRIG 1000ML POUR BTL (IV SOLUTION) ×2 IMPLANT
OIL CARTRIDGE MAESTRO DRILL (MISCELLANEOUS) ×2
PACK LAMINECTOMY NEURO (CUSTOM PROCEDURE TRAY) ×2 IMPLANT
PAD ARMBOARD 7.5X6 YLW CONV (MISCELLANEOUS) ×6 IMPLANT
PUTTY DBM ALLOSYNC PURE 10CC (Putty) ×1 IMPLANT
SPONGE LAP 4X18 RFD (DISPOSABLE) IMPLANT
SPONGE SURGIFOAM ABS GEL 100 (HEMOSTASIS) ×2 IMPLANT
STRIP CLOSURE SKIN 1/2X4 (GAUZE/BANDAGES/DRESSINGS) ×3 IMPLANT
SUT VIC AB 0 CT1 18XCR BRD8 (SUTURE) ×1 IMPLANT
SUT VIC AB 0 CT1 8-18 (SUTURE) ×2
SUT VIC AB 2-0 CP2 18 (SUTURE) ×2 IMPLANT
SUT VIC AB 3-0 SH 8-18 (SUTURE) ×4 IMPLANT
TOWEL GREEN STERILE (TOWEL DISPOSABLE) ×2 IMPLANT
TOWEL GREEN STERILE FF (TOWEL DISPOSABLE) ×2 IMPLANT
TRAY FOLEY MTR SLVR 16FR STAT (SET/KITS/TRAYS/PACK) IMPLANT
WATER STERILE IRR 1000ML POUR (IV SOLUTION) ×2 IMPLANT

## 2019-06-07 NOTE — Transfer of Care (Signed)
Immediate Anesthesia Transfer of Care Note  Patient: [name]  Procedure(s) Performed: Laminectomy and Foraminotomy Lumbar three-Lumbar four  Lumbar four-Lumbar five with non-instrumented fusion (N/A Back)  Patient Location: PACU  Anesthesia Type:General  Level of Consciousness: awake and alert   Airway & Oxygen Therapy: Patient Spontanous Breathing and Patient connected to face mask oxygen  Post-op Assessment: Report given to RN and Post -op Vital signs reviewed and stable  Post vital signs: Reviewed and stable  Last Vitals:  Vitals Value Taken Time  BP 163/88 06/07/19 1015  Temp    Pulse 82 06/07/19 1019  Resp 16 06/07/19 1019  SpO2 100 % 06/07/19 1019  Vitals shown include unvalidated device data.  Last Pain:  Vitals:   06/07/19 0612  TempSrc: Oral         Complications: No apparent anesthesia complications

## 2019-06-07 NOTE — Op Note (Signed)
06/07/2019  10:35 AM  PATIENT:  [name]  78 y.o. 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  Intertransverse arthrodesis L3-L5 on the left utilizing locally harvested morselized autologous bone graft and morselized allograft soaked with a bone marrow aspirate obtained through a separate fascial incision over the right iliac crest. 2.  Decompressive lumbar hemilaminectomy medial facetectomy and foraminotomies L3-4 on the left followed by sublaminar decompression, decompressive lumbar hemilaminectomy medial facetectomy foraminotomy L4-5 bilaterally  SURGEON:  Sherley Bounds, MD  ASSISTANTS: Glenford Peers, FNP  ANESTHESIA:   General  EBL: 20 ml  Total I/O In: 1000 [I.V.:1000] Out: 20 [Blood:20]  BLOOD ADMINISTERED: none  DRAINS: None  SPECIMEN:  none  INDICATION FOR PROCEDURE: This patient presented with back and bilateral leg pain. Imaging showed severe spinal stenosis at L3-4 and L4-5 with facet arthropathy and a grade 1 anterior listhesis.. The patient tried conservative measures without relief. Pain was debilitating. Recommended decompression and noninstrumented fusion to address her spinal stenosis and segmental instability. Patient understood the risks, benefits, and alternatives and potential outcomes and wished to proceed.  PROCEDURE DETAILS: The patient was taken to the operating room and after induction of adequate generalized endotracheal anesthesia, the patient was rolled into the prone position on the Wilson frame and all pressure points were padded. The lumbar region was cleaned and then prepped with DuraPrep and draped in the usual sterile fashion. 5 cc of local anesthesia was injected and then a dorsal midline incision was made and carried down to the lumbo sacral fascia. The fascia was opened and the paraspinous musculature was taken  down in a subperiosteal fashion to expose L3-4 and L4-5 bilaterally. Intraoperative x-ray confirmed my level, and then I used a combination of the high-speed drill and the Kerrison punches to perform a hemilaminectomy, medial facetectomy, and foraminotomy at L3-4 on the left and at L4-5 bilaterally.  The drilled bone was saved in a mucous trap for later arthrodesis.  The underlying yellow ligament was opened and removed in a piecemeal fashion to expose the underlying dura and exiting nerve root. I undercut the lateral recess and dissected down until I was medial to and distal to the pedicle.  There was severe stenosis at both levels with overgrown yellow ligament which was quite thickened and compressive.  The nerve root was well decompressed.  Performed a sublaminar decompression at L3-4 from the left to undercut the spinous process and the right lateral recess.   I then palpated with a coronary dilator along the nerve root and into the foramen to assure adequate decompression. I felt no more compression of the nerve root at either level.  I dissected in a suprafascial plane to the right iliac crest where open the fascia and used a Jamshidi needle to extract 40 cc of bone marrow aspirate from the right iliac crest.  This was spun down into 2 cc of bone marrow concentrate and soaked on 10 cc of cortical bone fibers.  I dried the hole with Surgifoam.  I exposed the transverse processes L3-L4 and L5 on the left, decorticated these and placed a mixture of local autograft and morselized allograft soaked with a bone marrow aspirate concentrate over the transverse processes of L3-L5.  I irrigated with saline solution containing bacitracin. Achieved hemostasis with bipolar cautery, lined the dura with Gelfoam, and then closed the fascia with 0 Vicryl. I closed the subcutaneous  tissues with 2-0 Vicryl and the subcuticular tissues with 3-0 Vicryl. The skin was then closed with benzoin and Steri-Strips. The drapes were  removed, a sterile dressing was applied.  My nurse practitioner was involved in the exposure, safe retraction of the neural elements, the disc work and the closure. the patient was awakened from general anesthesia and transferred to the recovery room in stable condition. At the end of the procedure all sponge, needle and instrument counts were correct.    PLAN OF CARE: Admit to inpatient   PATIENT DISPOSITION:  PACU - hemodynamically stable.   Delay start of Pharmacological VTE agent (>24hrs) due to surgical blood loss or risk of bleeding:  yes

## 2019-06-07 NOTE — Anesthesia Procedure Notes (Signed)
Procedure Name: Intubation Date/Time: 06/07/2019 7:38 AM Performed by: Valda Favia, CRNA Pre-anesthesia Checklist: Patient identified, Emergency Drugs available, Suction available, Patient being monitored and Timeout performed Patient Re-evaluated:Patient Re-evaluated prior to induction Oxygen Delivery Method: Circle system utilized Preoxygenation: Pre-oxygenation with 100% oxygen Induction Type: IV induction Ventilation: Mask ventilation without difficulty Laryngoscope Size: Mac and 4 Grade View: Grade I Tube type: Oral Tube size: 7.0 mm Number of attempts: 1 Airway Equipment and Method: Stylet Placement Confirmation: ETT inserted through vocal cords under direct vision,  positive ETCO2 and breath sounds checked- equal and bilateral Secured at: 21 cm Tube secured with: Tape Dental Injury: Teeth and Oropharynx as per pre-operative assessment

## 2019-06-07 NOTE — H&P (Signed)
Subjective: Patient is a 78 y.o. female admitted for spinal stenosis. Onset of symptoms was several months ago, gradually worsening since that time.  The pain is rated severe, and is located at the across the lower back and radiates to RLE. The pain is described as aching and occurs all day. The symptoms have been progressive. Symptoms are exacerbated by exercise. MRI or CT showed spondylolisthesis with stenosis   Past Medical History:  Diagnosis Date  . Acid reflux   . Arthritis    per patient "ankles, back, neck, leg, and shoulder"  . Cancer ([place])   . Diabetes mellitus   . Fatty liver   . Fibromyalgia   . Hyperlipidemia   . Hypertension   . 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 Neuropathy   . PAF (paroxysmal atrial fibrillation) ([place])   . Stroke ([place])   . Urinary tract infection 09/19/11   Being treated at this time Cipro    Past Surgical History:  Procedure Laterality Date  . APPENDECTOMY    . CATARACT EXTRACTION  2015  . CHOLECYSTECTOMY    . EYE SURGERY    . JOINT REPLACEMENT    . JOINT REPLACEMENT    . ROTATOR CUFF REPAIR      Prior to Admission medications   Medication Sig Start Date End Date Taking? Authorizing Provider  apixaban (ELIQUIS) 5 MG TABS tablet Take 1 tablet (5 mg total) by mouth 2 (two) times daily. 02/18/15  Yes Florencia Reasons, MD  Cholecalciferol (VITAMIN D PO) Take 1,000 Units by mouth daily.   Yes [provider]  ezetimibe (ZETIA) 10 MG tablet Take 1 tablet (10 mg total) by mouth daily. Patient taking differently: Take 5 mg by mouth daily. For cholesterol 02/18/15  Yes Florencia Reasons, MD  furosemide (LASIX) 20 MG tablet Take 1 tablet (20 mg total) by mouth every other day as needed for fluid or edema. 02/18/15  Yes Florencia Reasons, MD  gabapentin (NEURONTIN) 300 MG capsule Take 1 capsule (300 mg total) by mouth 3 (three) times daily. 02/08/16  Yes Melvenia Beam, MD  hydrochlorothiazide (HYDRODIURIL) 12.5 MG tablet Take 12.5 mg by mouth daily. 09/17/15  Yes  [provider]  metFORMIN (GLUCOPHAGE) 500 MG tablet Take 500 mg by mouth daily. 12/10/14  Yes [provider]  metoprolol succinate (TOPROL XL) 25 MG 24 hr tablet Take 0.5 tablets (12.5 mg total) by mouth daily. 02/20/15  Yes Lelon Perla, MD  Multiple Vitamins-Minerals (CENTRUM SILVER PO) Take 1 tablet by mouth daily.   Yes [provider]  olmesartan (BENICAR) 40 MG tablet Take 1 tablet (40 mg total) by mouth daily. 02/18/15  Yes Florencia Reasons, MD  Omega-3 Fatty Acids (FISH OIL PO) Take 1,200 mg by mouth daily.   Yes [provider]  pravastatin (PRAVACHOL) 20 MG tablet Take 1 tablet (20 mg total) by mouth daily. 02/18/15  Yes Florencia Reasons, MD  traMADol (ULTRAM) 50 MG tablet Take 1 tablet (50 mg total) by mouth every 6 (six) hours as needed. Patient taking differently: Take 50 mg by mouth every 6 (six) hours as needed for moderate pain or severe pain.  01/07/15  Yes Melvenia Beam, MD  vitamin B-12 (CYANOCOBALAMIN) 1000 MCG tablet Take 1,000 mcg by mouth daily.   Yes [provider]   No Known Allergies  Social History   Tobacco Use  . Smoking status: Never Smoker  . Smokeless tobacco: Never Used  Substance Use Topics  . Alcohol use:  Yes    Alcohol/week: 1.0 standard drinks    Types: 1 Glasses of wine per week    Comment: Rare    Family History  Problem Relation Age of Onset  . Heart Problems Brother   . Cancer Sister      Review of Systems  Positive ROS: neg  All other systems have been reviewed and were otherwise negative with the exception of those mentioned in the HPI and as above.  Objective: Vital signs in last 24 hours: Temp:  [98 F (36.7 C)] 98 F (36.7 C) (12/18 0612) Pulse Rate:  [71] 71 (12/18 0612) Resp:  [18] 18 (12/18 0612) BP: (138)/(88) 138/88 (12/18 0612) SpO2:  [99 %] 99 % (12/18 0612) Weight:  [73.7 kg] 73.7 kg (12/18 0612)  General Appearance: Alert, cooperative, no distress, appears stated age Head:  Normocephalic, without obvious abnormality, atraumatic Eyes: PERRL, conjunctiva/corneas clear, EOM's intact    Neck: Supple, symmetrical, trachea midline Back: Symmetric, no curvature, ROM normal, no CVA tenderness Lungs:  respirations unlabored Heart: Regular rate and rhythm Abdomen: Soft, non-tender Extremities: Extremities normal, atraumatic, no cyanosis or edema Pulses: 2+ and symmetric all extremities Skin: Skin color, texture, turgor normal, no rashes or lesions  NEUROLOGIC:   Mental status: Alert and oriented x4,  no aphasia, good attention span, fund of knowledge, and memory Motor Exam - grossly normal Sensory Exam - grossly normal Reflexes: 1+ Coordination - grossly normal Gait - grossly normal Balance - grossly normal Cranial Nerves: I: smell Not tested  II: visual acuity  OS: nl    OD: nl  II: visual fields Full to confrontation  II: pupils Equal, round, reactive to light  III,VII: ptosis None  III,IV,VI: extraocular muscles  Full ROM  V: mastication Normal  V: facial light touch sensation  Normal  V,VII: corneal reflex  Present  VII: facial muscle function - upper  Normal  VII: facial muscle function - lower Normal  VIII: hearing Not tested  IX: soft palate elevation  Normal  IX,X: gag reflex Present  XI: trapezius strength  5/5  XI: sternocleidomastoid strength 5/5  XI: neck flexion strength  5/5  XII: tongue strength  Normal    Data Review Lab Results  Component Value Date   WBC 7.8 06/05/2019   HGB 15.3 (H) 06/05/2019   HCT 47.4 (H) 06/05/2019   MCV 96.0 06/05/2019   PLT 282 06/05/2019   Lab Results  Component Value Date   NA 139 06/05/2019   K 4.0 06/05/2019   CL 101 06/05/2019   CO2 25 06/05/2019   BUN 21 06/05/2019   CREATININE 1.08 (H) 06/05/2019   GLUCOSE 124 (H) 06/05/2019   Lab Results  Component Value Date   INR 1.1 06/05/2019    Assessment/Plan:  Estimated body mass index is 32.81 kg/m as calculated from the following:    Height as of this encounter: 4\' 11"  (1.499 m).   Weight as of this encounter: 73.7 kg. Patient admitted for DLL / fusion L3-5. Patient has failed a reasonable attempt at conservative therapy.  I explained the condition and procedure to the patient and answered any questions.  Patient wishes to proceed with procedure as planned. Understands risks/ benefits and typical outcomes of procedure.   Eustace Moore 06/07/2019 7:20 AM

## 2019-06-07 NOTE — Anesthesia Postprocedure Evaluation (Signed)
Anesthesia Post Note  Patient: [name]  Procedure(s) Performed: Laminectomy and Foraminotomy Lumbar three-Lumbar four  Lumbar four-Lumbar five with non-instrumented fusion (N/A Back)     Patient location during evaluation: PACU Anesthesia Type: General Level of consciousness: awake and alert Pain management: pain level controlled Vital Signs Assessment: post-procedure vital signs reviewed and stable Respiratory status: spontaneous breathing, nonlabored ventilation and respiratory function stable Cardiovascular status: blood pressure returned to baseline and stable Postop Assessment: no apparent nausea or vomiting Anesthetic complications: no    Last Vitals:  Vitals:   06/07/19 1050 06/07/19 1105  BP: 133/85 (!) 142/72  Pulse: 72 65  Resp: 18 15  Temp:    SpO2: 93% 99%    Last Pain:  Vitals:   06/07/19 1050  TempSrc:   PainSc: [place]

## 2019-06-08 LAB — GLUCOSE, CAPILLARY: Glucose-Capillary: 150 mg/dL — ABNORMAL HIGH (ref 70–99)

## 2019-06-08 MED ORDER — HYDROCODONE-ACETAMINOPHEN 7.5-325 MG PO TABS: 1.0000 | ORAL_TABLET | Freq: Four times a day (QID) | ORAL | 0 refills | Status: DC

## 2019-06-08 MED ORDER — METHOCARBAMOL 500 MG PO TABS: 500.0000 mg | ORAL_TABLET | Freq: Four times a day (QID) | ORAL | 0 refills | Status: DC | PRN

## 2019-06-08 NOTE — Progress Notes (Signed)
Patient is discharged from room 3C06 at this time. Alert and in stable condition. IV site d/c'd and instructions read to patient with understanding verbalized. Left unit via wheelchair with all belongings at side.  

## 2019-06-08 NOTE — Discharge Instructions (Signed)

## 2019-06-08 NOTE — Evaluation (Signed)
Occupational Therapy Evaluation Patient Details Name: [name] MRN: [id number] DOB: December 24, 1940 Today's Date: 06/08/2019    History of Present Illness Pt is a 78 yo female s/p hemi laminectomy and foraminotomy L3-4, L4-5 non instrumented fusion. PMHx:CA,DM, HLD, HTN, fibromyalgia.   Clinical Impression   Pt PTA: living with spouse (who has had 5 back surgeries, but fully functional) and pt was independent with  ADL and mobility. Pt currently able to use a combination of hip hike technique and figure four technique for LB ADL. Pt dressing self with no cues for proper technique. Pt's pain is not limiting her ability to perform ADL tasks or mobility. Pt using furniture lightly in room for stability. Back handout provided and reviewed ADL in detail. Pt educated on: set an alarm at night for medication, avoid sitting for long periods of time, correct bed positioning for sleeping, correct sequence for bed mobility, avoiding lifting more than 5 pounds and never wash directly over incision. All education is complete and patient indicates understanding. Pt does not require continued OT skilled services. OT signing off.     Follow Up Recommendations  No OT follow up    Equipment Recommendations  None recommended by OT    Recommendations for Other Services       Precautions / Restrictions Precautions Precautions: Back Precaution Booklet Issued: Yes (comment) Precaution Comments: verbally discussed handoiut Restrictions Weight Bearing Restrictions: No      Mobility Bed Mobility Overal bed mobility: Needs Assistance Bed Mobility: Rolling;Sidelying to Sit;Sit to Sidelying Rolling: Supervision Sidelying to sit: Supervision     Sit to sidelying: Supervision General bed mobility comments: verbal cues for log roll technique  Transfers Overall transfer level: Modified independent                    Balance Overall balance assessment: No apparent balance deficits (not  formally assessed)                                         ADL either performed or assessed with clinical judgement   ADL Overall ADL's : At baseline;Modified independent                                       General ADL Comments: Pt able to use a combination of hip hike technique and figure four technique for LB ADL. Pt dressing self with no cues for proper technique. Pt's pain is not limiting her ability to perform ADL tasks.     Vision Baseline Vision/History: No visual deficits Patient Visual Report: No change from baseline Vision Assessment?: No apparent visual deficits     Perception     Praxis      Pertinent Vitals/Pain Pain Assessment: 0-10 Pain Score: 2  Pain Location: low back Pain Descriptors / Indicators: Discomfort Pain Intervention(s): Monitored during session     Hand Dominance Right   Extremity/Trunk Assessment Upper Extremity Assessment Upper Extremity Assessment: Overall WFL for tasks assessed   Lower Extremity Assessment Lower Extremity Assessment: Overall WFL for tasks assessed   Cervical / Trunk Assessment Cervical / Trunk Assessment: Other exceptions Cervical / Trunk Exceptions: s/p lumbar sx   Communication Communication Communication: No difficulties   Cognition Arousal/Alertness: Awake/alert Behavior During Therapy: WFL for tasks assessed/performed Overall Cognitive Status: Within Functional Limits for  tasks assessed                                     General Comments       Exercises     Shoulder Instructions      Home Living Family/patient expects to be discharged to:: Private residence Living Arrangements: Spouse/significant other Available Help at Discharge: Family;Available 24 hours/day Type of Home: House Home Access: Stairs to enter [employer] of Steps: 1   Home Layout: One level     Bathroom Shower/Tub: Occupational psychologist: Handicapped  height     Home Equipment: Environmental consultant - 2 wheels;Cane - single point;Bedside commode;Shower seat - built in;Grab bars - toilet;Grab bars - tub/shower          Prior Functioning/Environment Level of Independence: Independent                 OT Problem List: Decreased activity tolerance      OT Treatment/Interventions:      OT Goals(Current goals can be found in the care plan section)    OT Frequency:     Barriers to D/C:            Co-evaluation              AM-PAC OT "6 Clicks" Daily Activity     Outcome Measure Help from another person eating meals?: None Help from another person taking care of personal grooming?: None Help from another person toileting, which includes using toliet, bedpan, or urinal?: None Help from another person bathing (including washing, rinsing, drying)?: A Little Help from another person to put on and taking off regular upper body clothing?: None Help from another person to put on and taking off regular lower body clothing?: None 6 Click Score: 23   End of Session Nurse Communication: Mobility status  Activity Tolerance: Patient tolerated treatment well Patient left: in chair;with call bell/phone within reach  OT Visit Diagnosis: Unsteadiness on feet (R26.81);Pain Pain - part of body: (back)                Time: [id number] OT Time Calculation (min): 25 min Charges:  OT General Charges $OT Visit: 1 Visit OT Evaluation $OT Eval Moderate Complexity: [place] OTR/L Acute Rehabilitation Services Pager: [phone] Office: [place] 06/08/2019, 7:55 AM

## 2019-06-08 NOTE — Discharge Summary (Signed)
  Physician Discharge Summary  Patient ID: [name] MRN: [id number] DOB/AGE: [birth year] 78 y.o.  Admit date: 06/07/2019 Discharge date: 06/08/2019  Admission Diagnoses:  Discharge Diagnoses:  Active Problems:   S/P lumbar laminectomy   Discharged Condition: good  Hospital Course: Patient into the hospital where she underwent uncomplicated lumbar decompression and fusion.  Postop Truman Hayward doing well.  Preoperative back and lower extremity pain improved.  Standing ambulating and voiding without difficulty.  Ready for discharge home.  Consults:   Significant Diagnostic Studies:   Treatments:   Discharge Exam: Blood pressure (!) 113/97, pulse 62, temperature 97.7 F (36.5 C), resp. rate 18, height 4\' 11"  (1.499 m), weight 73.7 kg, SpO2 99 %. Awake and alert.  Oriented and appropriate.  Motor and sensory function intact.  Wound clean and dry.  Chest and abdomen benign.  Disposition: Discharge disposition: 01-Home or Self Care        Allergies as of 06/08/2019   No Known Allergies     Medication List    TAKE these medications   apixaban 5 MG Tabs tablet Commonly known as: ELIQUIS Take 1 tablet (5 mg total) by mouth 2 (two) times daily.   CENTRUM SILVER PO Take 1 tablet by mouth daily.   ezetimibe 10 MG tablet Commonly known as: Zetia Take 1 tablet (10 mg total) by mouth daily. What changed:   how much to take  additional instructions   FISH OIL PO Take 1,200 mg by mouth daily.   furosemide 20 MG tablet Commonly known as: Lasix Take 1 tablet (20 mg total) by mouth every other day as needed for fluid or edema.   gabapentin 300 MG capsule Commonly known as: NEURONTIN Take 1 capsule (300 mg total) by mouth 3 (three) times daily.   hydrochlorothiazide 12.5 MG tablet Commonly known as: HYDRODIURIL Take 12.5 mg by mouth daily.   HYDROcodone-acetaminophen 7.5-325 MG tablet Commonly known as: NORCO Take 1 tablet by mouth every 6 (six) hours.    metFORMIN 500 MG tablet Commonly known as: GLUCOPHAGE Take 500 mg by mouth daily.   methocarbamol 500 MG tablet Commonly known as: ROBAXIN Take 1 tablet (500 mg total) by mouth every 6 (six) hours as needed for muscle spasms.   metoprolol succinate 25 MG 24 hr tablet Commonly known as: Toprol XL Take 0.5 tablets (12.5 mg total) by mouth daily.   olmesartan 40 MG tablet Commonly known as: Benicar Take 1 tablet (40 mg total) by mouth daily.   pravastatin 20 MG tablet Commonly known as: Pravachol Take 1 tablet (20 mg total) by mouth daily.   traMADol 50 MG tablet Commonly known as: ULTRAM Take 1 tablet (50 mg total) by mouth every 6 (six) hours as needed. What changed: reasons to take this   vitamin B-12 1000 MCG tablet Commonly known as: CYANOCOBALAMIN Take 1,000 mcg by mouth daily.   VITAMIN D PO Take 1,000 Units by mouth daily.        Signed: Cooper Render [name] 06/08/2019, 9:30 AM

## 2019-06-08 NOTE — Evaluation (Signed)
Physical Therapy Evaluation Patient Details Name: [name] MRN: [id number] DOB: 06/20/1941 Today's Date: 06/08/2019   History of Present Illness  Pt is a 78 yo female s/p hemi laminectomy and foraminotomy L3-4, L4-5 non instrumented fusion. PMHx:CA,DM, HLD, HTN, fibromyalgia.  Clinical Impression  Patient is s/p above surgery resulting in the deficits listed below (see PT Problem List).  Patient dong very well from a mobility standpoint. I have answered all patient's question regarding PT and mobility.   I have encouraged the patient to gradually increase activity daily to tolerance.  No further needs from PT.  Pt anticipating DC to home today.       Follow Up Recommendations No PT follow up;Supervision - Intermittent    Equipment Recommendations  None recommended by PT    Recommendations for Other Services       Precautions / Restrictions Precautions Precautions: Back Precaution Booklet Issued: Yes (comment) Precaution Comments: (Pt able to state 3/3 back precautions without prompting) Restrictions Weight Bearing Restrictions: No      Mobility  Bed Mobility Overal bed mobility: Needs Assistance Bed Mobility: Rolling;Sidelying to Sit;Sit to Sidelying Rolling: Supervision Sidelying to sit: Supervision     Sit to sidelying: Supervision General bed mobility comments: (Pt able to verbally desribe log rolling as taught by OT)  Transfers Overall transfer level: Modified independent Equipment used: None             General transfer comment: Pt able to transfer sit to stand without difficulty from recliner chair  Ambulation/Gait Ambulation/Gait assistance: Supervision Gait Distance (Feet): 200 Feet Assistive device: None Gait Pattern/deviations: Step-through pattern     General Gait Details: Pt reports she has neuropathies which affect her gait.    Stairs Stairs: (Pt declined stair training. Able to pick feet up>12 inches therefore should not have an  issue with her 1 srep. She reports no difficulty with the step prior to surgery )          Wheelchair Mobility    Modified Rankin (Stroke Patients Only)       Balance Overall balance assessment: No apparent balance deficits (not formally assessed)                                           Pertinent Vitals/Pain Pain Assessment: 0-10 Pain Score: 4  Pain Location: low back Pain Descriptors / Indicators: Discomfort Pain Intervention(s): Monitored during session    Home Living Family/patient expects to be discharged to:: Private residence Living Arrangements: Spouse/significant other Available Help at Discharge: Family;Available 24 hours/day Type of Home: House Home Access: Stairs to enter Entrance Stairs-Rails: None Entrance Stairs-Number of Steps: 1 Home Layout: One level Home Equipment: Walker - 2 wheels;Cane - single point;Bedside commode;Shower seat - built in;Grab bars - toilet;Grab bars - tub/shower      Prior Function Level of Independence: Independent               Hand Dominance   Dominant Hand: Right    Extremity/Trunk Assessment   Upper Extremity Assessment Upper Extremity Assessment: Defer to OT evaluation    Lower Extremity Assessment Lower Extremity Assessment: Overall WFL for tasks assessed    Cervical / Trunk Assessment Cervical / Trunk Assessment: Other exceptions Cervical / Trunk Exceptions: s/p lumbar sx  Communication   Communication: No difficulties  Cognition Arousal/Alertness: Awake/alert Behavior During Therapy: WFL for tasks assessed/performed Overall Cognitive Status:  Within Functional Limits for tasks assessed                                        General Comments General comments (skin integrity, edema, etc.): No family present.      Exercises     Assessment/Plan    PT Assessment Patent does not need any further PT services  PT Problem List         PT Treatment Interventions       PT Goals (Current goals can be found in the Care Plan section)  Acute Rehab PT Goals Patient Stated Goal: To go home    Frequency     Barriers to discharge        Co-evaluation               AM-PAC PT "6 Clicks" Mobility  Outcome Measure Help needed turning from your back to your side while in a flat bed without using bedrails?: None Help needed moving from lying on your back to sitting on the side of a flat bed without using bedrails?: None Help needed moving to and from a bed to a chair (including a wheelchair)?: A Little Help needed standing up from a chair using your arms (e.g., wheelchair or bedside chair)?: None Help needed to walk in hospital room?: A Little Help needed climbing 3-5 steps with a railing? : A Little 6 Click Score: 21    End of Session Equipment Utilized During Treatment: Gait belt Activity Tolerance: Patient tolerated treatment well Patient left: in chair;with call bell/phone within reach Nurse Communication: Mobility status PT Visit Diagnosis: Difficulty in walking, not elsewhere classified (R26.2)    Time: [id number] PT Time Calculation (min) (ACUTE ONLY): 16 min   Charges:   PT Evaluation $PT Eval Low Complexity: 1 Low   06/08/2019           Lavonia Dana, PT   Acute Rehabilitation Services  Pager [phone] Office [phone] 06/08/2019   Melvern Banker 06/08/2019, 9:19 AM

## 2019-06-10 ENCOUNTER — Encounter: Payer: Self-pay | Admitting: *Deleted

## 2019-07-10 NOTE — Progress Notes (Signed)
HPI: FU atrial fibrillation. Patient admitted to [facility] in August 2016 with a TIA. Patient noted to be in atrial fib. She was treated with anticoagulation. Echocardiogram August 2016 showed normal LV function, restrictive filling, mild tricuspid regurgitation and trace mitral regurgitation. Carotid Dopplers August 2016 showed 1-39% bilateral stenosis.  Note she was bradycardic on atenolol 50 mg daily.  Monitor May 2020 showed atrial fibrillation with PVCs or aberrantly conducted beats rate controlled.  Since last seen,  she had chest heaviness yesterday that she attributes to indigestion.  There is no exertional chest pain.  Mild dyspnea on exertion but no orthopnea, PND, pedal edema, syncope or bleeding.  Current Outpatient Medications  Medication Sig Dispense Refill  . apixaban (ELIQUIS) 5 MG TABS tablet Take 1 tablet (5 mg total) by mouth 2 (two) times daily. 60 tablet 3  . Cholecalciferol (VITAMIN D PO) Take 1,000 Units by mouth daily.    Marland Kitchen ezetimibe (ZETIA) 10 MG tablet Take 1 tablet (10 mg total) by mouth daily. (Patient taking differently: Take 5 mg by mouth daily. For cholesterol) 30 tablet 3  . furosemide (LASIX) 20 MG tablet Take 1 tablet (20 mg total) by mouth every other day as needed for fluid or edema. 30 tablet 0  . gabapentin (NEURONTIN) 300 MG capsule Take 1 capsule (300 mg total) by mouth 3 (three) times daily. 90 capsule 3  . hydrochlorothiazide (HYDRODIURIL) 12.5 MG tablet Take 12.5 mg by mouth daily.    Marland Kitchen HYDROcodone-acetaminophen (NORCO) 7.5-325 MG tablet Take 1 tablet by mouth every 6 (six) hours. 30 tablet 0  . metFORMIN (GLUCOPHAGE) 500 MG tablet Take 500 mg by mouth daily.    . methocarbamol (ROBAXIN) 500 MG tablet Take 1 tablet (500 mg total) by mouth every 6 (six) hours as needed for muscle spasms. 30 tablet 0  . metoprolol succinate (TOPROL XL) 25 MG 24 hr tablet Take 0.5 tablets (12.5 mg total) by mouth daily. 90 tablet 3  . Multiple  Vitamins-Minerals (CENTRUM SILVER PO) Take 1 tablet by mouth daily.    Marland Kitchen olmesartan (BENICAR) 40 MG tablet Take 1 tablet (40 mg total) by mouth daily. 30 tablet 3  . Omega-3 Fatty Acids (FISH OIL PO) Take 1,200 mg by mouth daily.    . pravastatin (PRAVACHOL) 20 MG tablet Take 1 tablet (20 mg total) by mouth daily. 30 tablet 0  . traMADol (ULTRAM) 50 MG tablet Take 1 tablet (50 mg total) by mouth every 6 (six) hours as needed. (Patient taking differently: Take 50 mg by mouth every 6 (six) hours as needed for moderate pain or severe pain. ) 50 tablet 4  . vitamin B-12 (CYANOCOBALAMIN) 1000 MCG tablet Take 1,000 mcg by mouth daily.     No current facility-administered medications for this visit.     Past Medical History:  Diagnosis Date  . Acid reflux   . Arthritis    per patient "ankles, back, neck, leg, and shoulder"  . Cancer ([place])   . Diabetes mellitus   . Fatty liver   . Fibromyalgia   . Hyperlipidemia   . Hypertension   . 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 Neuropathy   . PAF (paroxysmal atrial fibrillation) ([place])   . Stroke ([place])   . Urinary tract infection 09/19/11   Being treated at this time Cipro    Past Surgical History:  Procedure Laterality Date  . APPENDECTOMY    . CATARACT EXTRACTION  2015  . CHOLECYSTECTOMY    .  EYE SURGERY    . JOINT REPLACEMENT    . JOINT REPLACEMENT    . LAMINECTOMY WITH POSTERIOR LATERAL ARTHRODESIS LEVEL 2 N/A 06/07/2019   Procedure: Laminectomy and Foraminotomy Lumbar three-Lumbar four  Lumbar four-Lumbar five with non-instrumented fusion;  Surgeon: Eustace Moore, MD;  Location: [place];  Service: Neurosurgery;  Laterality: N/A;  . ROTATOR CUFF REPAIR      Social History   Socioeconomic History  . Marital status: Married    Spouse name: [name]   . Number of children: 6  . Years of education: 67  . Highest education level: Not on file  Occupational History  . Occupation: Retired   Tobacco Use  . Smoking status: Never Smoker  .  Smokeless tobacco: Never Used  Substance and Sexual Activity  . Alcohol use: Yes    Alcohol/week: 1.0 standard drinks    Types: 1 Glasses of wine per week    Comment: Rare  . Drug use: No  . Sexual activity: Not on file  Other Topics Concern  . Not on file  Social History Narrative   Lives at home with husband, [name].   Caffeine use: 2-3 cups coffee per day   Social Determinants of Health   Financial Resource Strain:   . Difficulty of Paying Living Expenses: Not on file  Food Insecurity:   . Worried About Charity fundraiser in the Last Year: Not on file  . Ran Out of Food in the Last Year: Not on file  Transportation Needs:   . Lack of Transportation (Medical): Not on file  . Lack of Transportation (Non-Medical): Not on file  Physical Activity:   . Days of Exercise per Week: Not on file  . Minutes of Exercise per Session: Not on file  Stress:   . Feeling of Stress : Not on file  Social Connections:   . Frequency of Communication with Friends and Family: Not on file  . Frequency of Social Gatherings with Friends and Family: Not on file  . Attends Religious Services: Not on file  . Active Member of Clubs or Organizations: Not on file  . Attends Archivist Meetings: Not on file  . Marital Status: Not on file  Intimate Partner Violence:   . Fear of Current or Ex-Partner: Not on file  . Emotionally Abused: Not on file  . Physically Abused: Not on file  . Sexually Abused: Not on file    Family History  Problem Relation Age of Onset  . Heart Problems Brother   . Cancer Sister     ROS: no fevers or chills, productive cough, hemoptysis, dysphasia, odynophagia, melena, hematochezia, dysuria, hematuria, rash, seizure activity, orthopnea, PND, pedal edema, claudication. Remaining systems are negative.  Physical Exam: Well-developed well-nourished in no acute distress.  Skin is warm and dry.  HEENT is normal.  Neck is supple.  Chest is clear to auscultation with  normal expansion.  Cardiovascular exam is irregular Abdominal exam nontender or distended. No masses palpated. Extremities show no edema. neuro grossly intact  Electrocardiogram-atrial fibrillation, left anterior fascicular block, slight lateral T wave inversion unchanged compared to September 04, 2018.  Personally reviewed  A/P  1 permanent atrial fibrillation-plan will be rate control and anticoagulation.  Recent monitor showed heart rate is controlled.  Continue Toprol at present dose.  Continue apixaban.  She will need lifelong anticoagulation given previous TIA.  2 hypertension-blood pressure controlled.  Continue present medical regimen.  3 hyperlipidemia-continue statin.  4 chest heaviness-likely  indigestion.  She does not have exertional chest pain.  ECG shows no new ST changes.  Kirk Ruths, MD

## 2019-07-15 ENCOUNTER — Other Ambulatory Visit: Payer: Self-pay

## 2019-07-15 ENCOUNTER — Ambulatory Visit (INDEPENDENT_AMBULATORY_CARE_PROVIDER_SITE_OTHER): Payer: Medicare Other | Admitting: Cardiology

## 2019-07-15 ENCOUNTER — Encounter: Payer: Self-pay | Admitting: Cardiology

## 2019-07-15 VITALS — BP 139/92 | HR 75 | Ht <= 58 in | Wt 165.2 lb

## 2019-07-15 DIAGNOSIS — E785 Hyperlipidemia, unspecified: Secondary | ICD-10-CM | POA: Diagnosis not present

## 2019-07-15 DIAGNOSIS — I482 Chronic atrial fibrillation, unspecified: Secondary | ICD-10-CM

## 2019-07-15 DIAGNOSIS — I10 Essential (primary) hypertension: Secondary | ICD-10-CM | POA: Diagnosis not present

## 2019-07-15 NOTE — Patient Instructions (Signed)
Medication Instructions:  NO CHANGES *If you need a refill on your cardiac medications before your next appointment, please call your pharmacy*  Lab Work: NONE If you have labs (blood work) drawn today and your tests are completely normal, you will receive your results only by: Marland Kitchen MyChart Message (if you have MyChart) OR . A paper copy in the mail If you have any lab test that is abnormal or we need to change your treatment, we will call you to review the results.  Follow-Up: At [facility], you and your health needs are our priority.  As part of our continuing mission to provide you with exceptional heart care, we have created designated Provider Care Teams.  These Care Teams include your primary Cardiologist (physician) and Advanced Practice Providers (APPs -  Physician Assistants and Nurse Practitioners) who all work together to provide you with the care you need, when you need it.  Your next appointment:   6 month(s)  The format for your next appointment:   In Person  Provider:   DR. Stanford Breed

## 2019-07-18 NOTE — Addendum Note (Signed)
Addended by: Therisa Doyne on: 07/18/2019 02:39 PM   Modules accepted: Orders

## 2019-08-04 ENCOUNTER — Encounter (HOSPITAL_COMMUNITY): Payer: Self-pay

## 2019-08-04 ENCOUNTER — Other Ambulatory Visit: Payer: Self-pay

## 2019-08-04 ENCOUNTER — Emergency Department (HOSPITAL_COMMUNITY): Payer: Medicare Other

## 2019-08-04 ENCOUNTER — Emergency Department (HOSPITAL_COMMUNITY)
Admission: EM | Admit: 2019-08-04 | Discharge: 2019-08-04 | Disposition: A | Discharge status: Home / Self Care | Payer: Medicare Other | Attending: Emergency Medicine | Admitting: Emergency Medicine

## 2019-08-04 DIAGNOSIS — I10 Essential (primary) hypertension: Secondary | ICD-10-CM | POA: Diagnosis not present

## 2019-08-04 DIAGNOSIS — W0110XA Fall on same level from slipping, tripping and stumbling with subsequent striking against unspecified object, initial encounter: Secondary | ICD-10-CM | POA: Diagnosis not present

## 2019-08-04 DIAGNOSIS — Y92009 Unspecified place in unspecified non-institutional (private) residence as the place of occurrence of the external cause: Secondary | ICD-10-CM | POA: Diagnosis not present

## 2019-08-04 DIAGNOSIS — Z7984 Long term (current) use of oral hypoglycemic drugs: Secondary | ICD-10-CM | POA: Diagnosis not present

## 2019-08-04 DIAGNOSIS — Y999 Unspecified external cause status: Secondary | ICD-10-CM | POA: Insufficient documentation

## 2019-08-04 DIAGNOSIS — M25572 Pain in left ankle and joints of left foot: Secondary | ICD-10-CM | POA: Diagnosis not present

## 2019-08-04 DIAGNOSIS — Z7901 Long term (current) use of anticoagulants: Secondary | ICD-10-CM | POA: Diagnosis not present

## 2019-08-04 DIAGNOSIS — Y9301 Activity, walking, marching and hiking: Secondary | ICD-10-CM | POA: Diagnosis not present

## 2019-08-04 DIAGNOSIS — S7001XA Contusion of right hip, initial encounter: Secondary | ICD-10-CM | POA: Diagnosis not present

## 2019-08-04 DIAGNOSIS — E119 Type 2 diabetes mellitus without complications: Secondary | ICD-10-CM | POA: Diagnosis not present

## 2019-08-04 DIAGNOSIS — Z79899 Other long term (current) drug therapy: Secondary | ICD-10-CM | POA: Diagnosis not present

## 2019-08-04 MED ORDER — ONDANSETRON HCL 4 MG PO TABS
4.0000 mg | ORAL_TABLET | Freq: Once | ORAL | Status: AC
  Administered 2019-08-04: 4 mg via ORAL
  Filled 2019-08-04: qty 1

## 2019-08-04 MED ORDER — HYDROCODONE-ACETAMINOPHEN 5-325 MG PO TABS: 1.0000 | ORAL_TABLET | Freq: Four times a day (QID) | ORAL | 0 refills | Status: DC | PRN

## 2019-08-04 MED ORDER — HYDROCODONE-ACETAMINOPHEN 5-325 MG PO TABS
1.0000 | ORAL_TABLET | Freq: Once | ORAL | Status: AC
  Administered 2019-08-04: 1 via ORAL
  Filled 2019-08-04: qty 1

## 2019-08-04 MED ORDER — ONDANSETRON HCL 4 MG PO TABS: 4.0000 mg | ORAL_TABLET | Freq: Four times a day (QID) | ORAL | 0 refills | Status: DC | PRN

## 2019-08-04 NOTE — Discharge Instructions (Addendum)
You have been seen today for left ankle pain. Please read and follow all provided instructions. Return to the emergency room for worsening condition or new concerning symptoms.    The xrays today do not show any broken bones. You likely have an ankle sprain.  1. Medications:  Prescription sent to your pharmacy for hydrocodone. This is a strong narcotic pain medicine. Please take as prescribed and only if needed for severe pain. This medicine can make you nauseas so please take with food.  -Prescription also sent for zofran to take for nausea if needed.  Continue usual home medications Take medications as prescribed. Please review all of the medicines and only take them if you do not have an allergy to them.   2. Treatment: rest, drink plenty of fluids  3. Follow Up:  Please follow up with primary care provider by scheduling an appointment as soon as possible for a visit or you can follow up with your orthopedist if you continues to have pain    It is also a possibility that you have an allergic reaction to any of the medicines that you have been prescribed - Everybody reacts differently to medications and while MOST people have no trouble with most medicines, you may have a reaction such as nausea, vomiting, rash, swelling, shortness of breath. If this is the case, please stop taking the medicine immediately and contact your physician.  ?

## 2019-08-04 NOTE — ED Triage Notes (Signed)
EMS reports from home, fall 10 days ago, increasing left foot pain and swelling. Denies LOC or other injury, no obvious deformation.  BP 118/92 HR 94. RR 16 Sp02 97 RA CBG 191 Temp 97.0

## 2019-08-04 NOTE — ED Provider Notes (Signed)
[facility] DEPT Provider Note   CSN: [id number] Arrival date & time: 08/04/19  [id number]     History Chief Complaint  Patient presents with  . Fall  . Ankle Pain    [name] is a 79 y.o. female with past medical history significant for arthritis, diabetes, fibromyalgia, hypertension hyperlipidemia, lymphoma, paroxysmal A. Fib on eliquis, CVA presents to emergency department today via EMS with chief complaint of mechanical fall x10 days ago.  She was walking to the bathroom in the middle of the night in the dark when she fell and landed on her right side.  She denies hitting her head is loss of consciousness.  She states she was only on the ground for several minutes before her husband was able to help her up and get back into bed.  She has been able to walk and bear weight on both lower extremities.  She has a large hematoma on her right hip from the fall.  She endorses mild pain in her right hip. She is also endorsing pain and swelling in her left shin, ankle and foot.  Pain has been constant and progressively worsening since the fall.  She rates the pain 10/10 severity.  She took a hydrocodone earlier this morning with minimal pain relief. This morning she had difficulty walking because of pain and called EMS.  She also admits to a history of chronic back pain, states is unchanged today.  She denies any head injury, loss of consciousness, headache, neck pain, visual changes, chest pain, shortness of breath, nausea, vomiting, numbness, weakness.  Past Medical History:  Diagnosis Date  . Acid reflux   . Arthritis    per patient "ankles, back, neck, leg, and shoulder"  . Cancer ([place])   . Diabetes mellitus   . Fatty liver   . Fibromyalgia   . Hyperlipidemia   . Hypertension   . 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 Neuropathy   . PAF (paroxysmal atrial fibrillation) ([place])   . Stroke ([place])   . Urinary tract infection 09/19/11   Being treated at  this time Cipro    Patient Active Problem List   Diagnosis Date Noted  . S/P lumbar laminectomy 06/07/2019  . Abnormal EKG 03/06/2018  . Syncope 03/06/2018  . Preop cardiovascular exam 03/06/2018  . 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 Chronic anticoagulation 04/21/2015  . PAF (paroxysmal atrial fibrillation) ([place]) 02/20/2015  . Essential hypertension 02/20/2015  . Cerebral infarction due to embolism of right middle cerebral artery ([place])   . Dyslipidemia   . Slurred speech 02/16/2015  . History of TIA (transient ischemic attack) 02/16/2015  . Diabetes ([place]) 02/16/2015  . Atrial flutter ([place]) 02/16/2015  . Acute renal failure ([place]) 02/16/2015  . Hereditary and idiopathic peripheral neuropathy 12/16/2014  . FLANK PAIN, RIGHT 01/19/2010  . Non Hodgkin's lymphoma ([place]) 11/30/2009  . UNSPECIFIED DISORDER OF KIDNEY AND URETER 11/30/2009  . DIARRHEA 11/30/2009  . FEVER, HX OF 11/30/2009    Past Surgical History:  Procedure Laterality Date  . APPENDECTOMY    . CATARACT EXTRACTION  2015  . CHOLECYSTECTOMY    . EYE SURGERY    . JOINT REPLACEMENT    . JOINT REPLACEMENT    . LAMINECTOMY WITH POSTERIOR LATERAL ARTHRODESIS LEVEL 2 N/A 06/07/2019   Procedure: Laminectomy and Foraminotomy Lumbar three-Lumbar four  Lumbar four-Lumbar five with non-instrumented fusion;  Surgeon: Eustace Moore, MD;  Location: [facility];  Service: Neurosurgery;  Laterality: N/A;  .  ROTATOR CUFF REPAIR       OB History   No obstetric history on file.     Family History  Problem Relation Age of Onset  . Heart Problems Brother   . Cancer Sister     Social History   Tobacco Use  . Smoking status: Never Smoker  . Smokeless tobacco: Never Used  Substance Use Topics  . Alcohol use: Yes    Alcohol/week: 1.0 standard drinks    Types: 1 Glasses of wine per week    Comment: Rare  . Drug use: No    Home Medications Prior to Admission medications   Medication Sig Start Date End Date  Taking? Authorizing Provider  apixaban (ELIQUIS) 5 MG TABS tablet Take 1 tablet (5 mg total) by mouth 2 (two) times daily. 02/18/15   Florencia Reasons, MD  Cholecalciferol (VITAMIN D PO) Take 1,000 Units by mouth daily.    [provider]  ezetimibe (ZETIA) 10 MG tablet Take 1 tablet (10 mg total) by mouth daily. Patient taking differently: Take 5 mg by mouth daily. For cholesterol 02/18/15   Florencia Reasons, MD  furosemide (LASIX) 20 MG tablet Take 1 tablet (20 mg total) by mouth every other day as needed for fluid or edema. 02/18/15   Florencia Reasons, MD  gabapentin (NEURONTIN) 300 MG capsule Take 1 capsule (300 mg total) by mouth 3 (three) times daily. 02/08/16   Melvenia Beam, MD  hydrochlorothiazide (HYDRODIURIL) 12.5 MG tablet Take 12.5 mg by mouth daily. 09/17/15   [provider]  HYDROcodone-acetaminophen (NORCO/VICODIN) 5-325 MG tablet Take 1 tablet by mouth every 6 (six) hours as needed for severe pain. 08/04/19   Tiajuana Leppanen E, PA-C  metFORMIN (GLUCOPHAGE) 500 MG tablet Take 500 mg by mouth daily. 12/10/14   [provider]  methocarbamol (ROBAXIN) 500 MG tablet Take 1 tablet (500 mg total) by mouth every 6 (six) hours as needed for muscle spasms. 06/08/19   Earnie Larsson, MD  metoprolol succinate (TOPROL XL) 25 MG 24 hr tablet Take 0.5 tablets (12.5 mg total) by mouth daily. 02/20/15   Lelon Perla, MD  Multiple Vitamins-Minerals (CENTRUM SILVER PO) Take 1 tablet by mouth daily.    [provider]  olmesartan (BENICAR) 40 MG tablet Take 1 tablet (40 mg total) by mouth daily. 02/18/15   Florencia Reasons, MD  Omega-3 Fatty Acids (FISH OIL PO) Take 1,200 mg by mouth daily.    [provider]  ondansetron (ZOFRAN) 4 MG tablet Take 1 tablet (4 mg total) by mouth every 6 (six) hours as needed for nausea or vomiting. 08/04/19   Takeshia Wenk, Verline Lema E, PA-C  pravastatin (PRAVACHOL) 20 MG tablet Take 1 tablet (20 mg total) by mouth daily. 02/18/15   Florencia Reasons, MD  vitamin B-12  (CYANOCOBALAMIN) 1000 MCG tablet Take 1,000 mcg by mouth daily.    [provider]    Allergies    Patient has no known allergies.  Review of Systems   Review of Systems All other systems are reviewed and are negative for acute change except as noted in the HPI.  Physical Exam Updated Vital Signs BP (!) 120/93   Pulse 82   Temp 98.9 F (37.2 C)   Resp 16   SpO2 98%   Physical Exam Vitals and nursing note reviewed.  Constitutional:      General: She is not in acute distress.    Appearance: She is not ill-appearing.  HENT:     Head: Normocephalic  and atraumatic.     Comments: No tenderness to palpation of skull. No deformities or crepitus noted. No open wounds, abrasions or lacerations.     Right Ear: Tympanic membrane and external ear normal.     Left Ear: Tympanic membrane and external ear normal.     Nose: Nose normal.     Mouth/Throat:     Mouth: Mucous membranes are moist.     Pharynx: Oropharynx is clear.  Eyes:     General: No scleral icterus.       Right eye: No discharge.        Left eye: No discharge.     Extraocular Movements: Extraocular movements intact.     Conjunctiva/sclera: Conjunctivae normal.     Pupils: Pupils are equal, round, and reactive to light.  Neck:     Vascular: No JVD.     Comments: Full ROM intact without spinous process TTP. No bony stepoffs or deformities, no paraspinous muscle TTP or muscle spasms. No rigidity or meningeal signs. No bruising, erythema, or swelling.  Cardiovascular:     Rate and Rhythm: Normal rate and regular rhythm.     Pulses: Normal pulses.          Radial pulses are 2+ on the right side and 2+ on the left side.     Heart sounds: Normal heart sounds.  Pulmonary:     Comments: Lungs clear to auscultation in all fields. Symmetric chest rise. No wheezing, rales, or rhonchi. Abdominal:     Comments: Abdomen is soft, non-distended, and non-tender in all quadrants. No rigidity, no guarding. No peritoneal  signs.  Musculoskeletal:        General: Normal range of motion.     Cervical back: Normal range of motion.     Comments: Large hematoma to lateral aspect of right femur purple and yellow in color. Appropriately tender to palpation. Full ROM of right hip, knee, ankle.  Left hip and knee with full ROM. No tenderness to palpation.  Tenderness to palpation of proximal tib fib. There is swelling and tenderness over the lateral malleolus and forefoot. No overt deformity. Tenderness over the medial aspect of the ankle. The fifth metatarsal is not tender. The ankle joint is intact without excessive opening on stressing. No break in skin. Good pedal pulse and cap refill of all toes. Wiggling toes without difficulty.  No midline tenderness to thoracic or lumbar spine.  No paraspinal muscle tenderness.  Skin:    General: Skin is warm and dry.     Capillary Refill: Capillary refill takes less than 2 seconds.  Neurological:     Mental Status: She is oriented to person, place, and time.     GCS: GCS eye subscore is 4. GCS verbal subscore is 5. GCS motor subscore is 6.     Comments: Fluent speech, no facial droop.  Psychiatric:        Behavior: Behavior normal.     ED Results / Procedures / Treatments   Labs (all labs ordered are listed, but only abnormal results are displayed) Labs Reviewed - No data to display  EKG None  Radiology DG Tibia/Fibula Left  Result Date: 08/04/2019 CLINICAL DATA:  Status post fall with left lower extremity pain. EXAM: LEFT TIBIA AND FIBULA - 2 VIEW COMPARISON:  None. FINDINGS: There is no evidence of fracture or dislocation. Left knee replacement is identified. Soft tissues are unremarkable. IMPRESSION: No acute fracture or dislocation. Electronically Signed   By: Mallie Darting.D.  On: 08/04/2019 11:04   DG Ankle Complete Left  Result Date: 08/04/2019 CLINICAL DATA:  Status post fall with left lower extremity pain. EXAM: LEFT ANKLE COMPLETE - 3+ VIEW  COMPARISON:  None. FINDINGS: There is no evidence of fracture, dislocation, or joint effusion. Soft tissue swelling of the lateral malleolus is identified. IMPRESSION: No acute fracture or dislocation. Soft tissue swelling of lateral malleolus. Electronically Signed   By: Abelardo Diesel M.D.   On: 08/04/2019 11:06   DG Foot Complete Left  Result Date: 08/04/2019 CLINICAL DATA:  Status post fall with left lower extremity pain. EXAM: LEFT FOOT - COMPLETE 3+ VIEW COMPARISON:  None. FINDINGS: There is no evidence of fracture or dislocation. Soft tissues are unremarkable. IMPRESSION: No acute fracture or dislocation. Electronically Signed   By: Abelardo Diesel M.D.   On: 08/04/2019 11:06    Procedures Procedures (including critical care time)  Medications Ordered in ED Medications  HYDROcodone-acetaminophen (NORCO/VICODIN) 5-325 MG per tablet 1 tablet (1 tablet Oral Given 08/04/19 1254)  ondansetron (ZOFRAN) tablet 4 mg (4 mg Oral Given 08/04/19 1254)    ED Course  I have reviewed the triage vital signs and the nursing notes.  Pertinent labs & imaging results that were available during my care of the patient were reviewed by me and considered in my medical decision making (see chart for details).    MDM Rules/Calculators/A&P                      79 yo female on eliquis for PAF presents to the ED with complaints of pain to the left tib fib and ankle s/p injury mechanical fall x 10 days ago.  Exam without obvious deformity or open wounds. ROM of left ankle decreased secondary to pain. Tender to palpation over proximal tib fib, lateral and medial malleolus, and forefoot. NVI distally. She has hematoma on right hip that looks to be healing, no open wounds or deformities. Pelvis is stable. No signs of head, neck or back injury.  Xrays of left tib fib, ankle, foot viewed by me are negative for fracture/dislocation.  Therapeutic cam walker provided. I ambulated patient using a walker and she did so with  pain but was able to. She has a walker at home she will use for ambulation. Will discharge with short course of norco for pain. I have reviewed the PDMP during this encounter. No recent or current narcotic prescriptions.  I discussed results, treatment plan, need for follow-up, and return precautions with the patient. Provided opportunity for questions, patient confirmed understanding and are in agreement with plan.   The patient was discussed with and seen by Dr. Wilson Singer who agrees with the treatment plan.   Portions of this note were generated with [employer]. Dictation errors may occur despite best attempts at proofreading.   Final Clinical Impression(s) / ED Diagnoses Final diagnoses:  Acute left ankle pain    Rx / DC Orders ED Discharge Orders         Ordered    HYDROcodone-acetaminophen (NORCO/VICODIN) 5-325 MG tablet  Every 6 hours PRN     08/04/19 1345    ondansetron (ZOFRAN) 4 MG tablet  Every 6 hours PRN     08/04/19 1347           Cherre Robins, PA-C 08/04/19 1357    Virgel Manifold, MD 08/07/19 [phone]

## 2020-01-18 NOTE — Progress Notes (Signed)
HPI: FUatrial fibrillation. Patient admitted to [facility] in August 2016 with a TIA. Patient noted to be in atrial fib. She was treated with anticoagulation. Echocardiogram August 2016 showed normal LV function, restrictive filling, mild tricuspid regurgitation and trace mitral regurgitation. Carotid Dopplers August 2016 showed 1-39% bilateral stenosis. Note she was bradycardic on atenolol 50 mg daily. Monitor May 2020 showed atrial fibrillation with PVCs or aberrantly conducted beats rate controlled.  Sincelast seen,  she denies dyspnea, chest pain, palpitations or syncope.  No bleeding.  Current Outpatient Medications  Medication Sig Dispense Refill   apixaban (ELIQUIS) 5 MG TABS tablet Take 1 tablet (5 mg total) by mouth 2 (two) times daily. 60 tablet 3   Cholecalciferol (VITAMIN D PO) Take 1,000 Units by mouth daily.     ezetimibe (ZETIA) 10 MG tablet Take 1 tablet (10 mg total) by mouth daily. (Patient taking differently: Take 5 mg by mouth daily. For cholesterol) 30 tablet 3   furosemide (LASIX) 20 MG tablet Take 1 tablet (20 mg total) by mouth every other day as needed for fluid or edema. 30 tablet 0   gabapentin (NEURONTIN) 300 MG capsule Take 1 capsule (300 mg total) by mouth 3 (three) times daily. 90 capsule 3   hydrochlorothiazide (HYDRODIURIL) 12.5 MG tablet Take 12.5 mg by mouth daily.     HYDROcodone-acetaminophen (NORCO/VICODIN) 5-325 MG tablet Take 1 tablet by mouth every 6 (six) hours as needed for severe pain. 10 tablet 0   metFORMIN (GLUCOPHAGE) 500 MG tablet Take 500 mg by mouth daily.     methocarbamol (ROBAXIN) 500 MG tablet Take 1 tablet (500 mg total) by mouth every 6 (six) hours as needed for muscle spasms. 30 tablet 0   metoprolol succinate (TOPROL XL) 25 MG 24 hr tablet Take 0.5 tablets (12.5 mg total) by mouth daily. 90 tablet 3   Multiple Vitamins-Minerals (CENTRUM SILVER PO) Take 1 tablet by mouth daily.     olmesartan (BENICAR) 40 MG  tablet Take 1 tablet (40 mg total) by mouth daily. 30 tablet 3   Omega-3 Fatty Acids (FISH OIL PO) Take 1,200 mg by mouth daily.     ondansetron (ZOFRAN) 4 MG tablet Take 1 tablet (4 mg total) by mouth every 6 (six) hours as needed for nausea or vomiting. 10 tablet 0   pravastatin (PRAVACHOL) 20 MG tablet Take 1 tablet (20 mg total) by mouth daily. 30 tablet 0   vitamin B-12 (CYANOCOBALAMIN) 1000 MCG tablet Take 1,000 mcg by mouth daily.     No current facility-administered medications for this visit.     Past Medical History:  Diagnosis Date   Acid reflux    Arthritis    per patient "ankles, back, neck, leg, and shoulder"   Cancer ([place])    Diabetes mellitus    Fatty liver    Fibromyalgia    Hyperlipidemia    Hypertension    Lymphoma malignant, large cell ([facility])    Neuropathy    PAF (paroxysmal atrial fibrillation) ([employer])    Stroke ([place])    Urinary tract infection 09/19/11   Being treated at this time Cipro    Past Surgical History:  Procedure Laterality Date   APPENDECTOMY     CATARACT EXTRACTION  2015   CHOLECYSTECTOMY     EYE SURGERY     JOINT REPLACEMENT     JOINT REPLACEMENT     LAMINECTOMY WITH POSTERIOR LATERAL ARTHRODESIS LEVEL 2 N/A 06/07/2019   Procedure: Laminectomy and  Foraminotomy Lumbar three-Lumbar four  Lumbar four-Lumbar five with non-instrumented fusion;  Surgeon: Eustace Moore, MD;  Location: [facility];  Service: Neurosurgery;  Laterality: N/A;   ROTATOR CUFF REPAIR      Social History   Socioeconomic History   Marital status: Married    Spouse name: [name]    Number of children: 6   Years of education: 12   Highest education level: Not on file  Occupational History   Occupation: Retired   Tobacco Use   Smoking status: Never Smoker   Smokeless tobacco: Never Used  Scientific laboratory technician Use: Never used  Substance and Sexual Activity   Alcohol use: Yes    Alcohol/week: 1.0 standard drink    Types: 1 Glasses of wine  per week    Comment: Rare   Drug use: No   Sexual activity: Not on file  Other Topics Concern   Not on file  Social History Narrative   Lives at home with husband, [name].   Caffeine use: 2-3 cups coffee per day   Social Determinants of Health   Financial Resource Strain:    Difficulty of Paying Living Expenses:   Food Insecurity:    Worried About Charity fundraiser in the Last Year:    Arboriculturist in the Last Year:   Transportation Needs:    Film/video editor (Medical):    Lack of Transportation (Non-Medical):   Physical Activity:    Days of Exercise per Week:    Minutes of Exercise per Session:   Stress:    Feeling of Stress :   Social Connections:    Frequency of Communication with Friends and Family:    Frequency of Social Gatherings with Friends and Family:    Attends Religious Services:    Active Member of Clubs or Organizations:    Attends Music therapist:    Marital Status:   Intimate Partner Violence:    Fear of Current or Ex-Partner:    Emotionally Abused:    Physically Abused:    Sexually Abused:     Family History  Problem Relation Age of Onset   Heart Problems Brother    Cancer Sister     ROS: no fevers or chills, productive cough, hemoptysis, dysphasia, odynophagia, melena, hematochezia, dysuria, hematuria, rash, seizure activity, orthopnea, PND, pedal edema, claudication. Remaining systems are negative.  Physical Exam: Well-developed well-nourished in no acute distress.  Skin is warm and dry.  HEENT is normal.  Neck is supple.  Chest is clear to auscultation with normal expansion.  Cardiovascular exam is irregular Abdominal exam nontender or distended. No masses palpated. Extremities show no edema. neuro grossly intact  A/P  1 permanent atrial fibrillation-as outlined in previous notes plan is rate control and anticoagulation.  Plan to continue Toprol and apixaban.  Given history of TIA she will  need lifelong anticoagulation.  Check hemoglobin and renal function.  Note she has fallen several times by her report.  I recommended a cane.  I discussed the risk of falling on anticoagulation.  2 hypertension-blood pressure borderline.  Continue present medications and follow.  Check potassium and renal function.  3 hyperlipidemia-continue statin.  Check lipids and liver.  Kirk Ruths, MD

## 2020-01-24 ENCOUNTER — Encounter: Payer: Self-pay | Admitting: Cardiology

## 2020-01-24 ENCOUNTER — Ambulatory Visit (INDEPENDENT_AMBULATORY_CARE_PROVIDER_SITE_OTHER): Payer: Medicare Other | Admitting: Cardiology

## 2020-01-24 ENCOUNTER — Other Ambulatory Visit: Payer: Self-pay

## 2020-01-24 VITALS — BP 136/92 | HR 77 | Ht 59.0 in | Wt 160.0 lb

## 2020-01-24 DIAGNOSIS — E785 Hyperlipidemia, unspecified: Secondary | ICD-10-CM

## 2020-01-24 DIAGNOSIS — I482 Chronic atrial fibrillation, unspecified: Secondary | ICD-10-CM

## 2020-01-24 DIAGNOSIS — I10 Essential (primary) hypertension: Secondary | ICD-10-CM

## 2020-01-24 LAB — CBC
Hematocrit: 43.3 % (ref 34.0–46.6)
Hemoglobin: 14.5 g/dL (ref 11.1–15.9)
MCH: 30.7 pg (ref 26.6–33.0)
MCHC: 33.5 g/dL (ref 31.5–35.7)
MCV: 92 fL (ref 79–97)
Platelets: 241 10*3/uL (ref 150–450)
RBC: 4.72 x10E6/uL (ref 3.77–5.28)
RDW: 13.3 % (ref 11.7–15.4)
WBC: 6.9 10*3/uL (ref 3.4–10.8)

## 2020-01-24 LAB — COMPREHENSIVE METABOLIC PANEL
ALT: 32 IU/L (ref 0–32)
AST: 31 IU/L (ref 0–40)
Albumin/Globulin Ratio: 1.5 (ref 1.2–2.2)
Albumin: 4.3 g/dL (ref 3.7–4.7)
Alkaline Phosphatase: 115 IU/L (ref 48–121)
BUN/Creatinine Ratio: 19 (ref 12–28)
BUN: 19 mg/dL (ref 8–27)
Bilirubin Total: 1.2 mg/dL (ref 0.0–1.2)
CO2: 24 mmol/L (ref 20–29)
Calcium: 9.9 mg/dL (ref 8.7–10.3)
Chloride: 101 mmol/L (ref 96–106)
Creatinine, Ser: 1.02 mg/dL — ABNORMAL HIGH (ref 0.57–1.00)
GFR calc Af Amer: 61 mL/min/{1.73_m2} (ref >59)
GFR calc non Af Amer: 53 mL/min/{1.73_m2} — ABNORMAL LOW (ref >59)
Globulin, Total: 2.8 g/dL (ref 1.5–4.5)
Glucose: 118 mg/dL — ABNORMAL HIGH (ref 65–99)
Potassium: 4.4 mmol/L (ref 3.5–5.2)
Sodium: 140 mmol/L (ref 134–144)
Total Protein: 7.1 g/dL (ref 6.0–8.5)

## 2020-01-24 LAB — LIPID PANEL
Chol/HDL Ratio: 2.1 ratio (ref 0.0–4.4)
Cholesterol, Total: 142 mg/dL (ref 100–199)
HDL: 68 mg/dL (ref >39)
LDL Chol Calc (NIH): 58 mg/dL (ref 0–99)
Triglycerides: 84 mg/dL (ref 0–149)
VLDL Cholesterol Cal: 16 mg/dL (ref 5–40)

## 2020-01-24 NOTE — Patient Instructions (Signed)
Medication Instructions:  NO CHANGE *If you need a refill on your cardiac medications before your next appointment, please call your pharmacy*   Lab Work: Your physician recommends that you HAVE LAB WORK TODAY If you have labs (blood work) drawn today and your tests are completely normal, you will receive your results only by: Marland Kitchen MyChart Message (if you have MyChart) OR . A paper copy in the mail If you have any lab test that is abnormal or we need to change your treatment, we will call you to review the results.  Follow-Up: At [facility], you and your health needs are our priority.  As part of our continuing mission to provide you with exceptional heart care, we have created designated Provider Care Teams.  These Care Teams include your primary Cardiologist (physician) and Advanced Practice Providers (APPs -  Physician Assistants and Nurse Practitioners) who all work together to provide you with the care you need, when you need it.  We recommend signing up for the patient portal called "MyChart".  Sign up information is provided on this After Visit Summary.  MyChart is used to connect with patients for Virtual Visits (Telemedicine).  Patients are able to view lab/test results, encounter notes, upcoming appointments, etc.  Non-urgent messages can be sent to your provider as well.   To learn more about what you can do with MyChart, go to [email or web address].    Your next appointment:   12 month(s)  The format for your next appointment:   In Person  Provider:   Kirk Ruths, MD IN THE [place]

## 2020-01-27 ENCOUNTER — Encounter: Payer: Self-pay | Admitting: *Deleted

## 2020-05-05 ENCOUNTER — Other Ambulatory Visit: Payer: Self-pay | Admitting: Student

## 2020-05-05 DIAGNOSIS — M48062 Spinal stenosis, lumbar region with neurogenic claudication: Secondary | ICD-10-CM

## 2020-05-25 ENCOUNTER — Ambulatory Visit
Admission: RE | Admit: 2020-05-25 | Discharge: 2020-05-25 | Disposition: A | Discharge status: Home / Self Care | Payer: Medicare Other | Source: Ambulatory Visit | Attending: Student | Admitting: Student

## 2020-05-25 ENCOUNTER — Other Ambulatory Visit: Payer: Self-pay

## 2020-05-25 DIAGNOSIS — M48062 Spinal stenosis, lumbar region with neurogenic claudication: Secondary | ICD-10-CM

## 2020-05-25 MED ORDER — GADOBENATE DIMEGLUMINE 529 MG/ML IV SOLN
15.0000 mL | Freq: Once | INTRAVENOUS | Status: AC | PRN
  Administered 2020-05-25: 15 mL via INTRAVENOUS

## 2021-03-16 NOTE — Progress Notes (Deleted)
HPI: FU atrial fibrillation. Patient admitted to [facility] in August 2016 with a TIA. Patient noted to be in atrial fib. She was treated with anticoagulation. Echocardiogram August 2016 showed normal LV function, restrictive filling, mild tricuspid regurgitation and trace mitral regurgitation. Carotid Dopplers August 2016 showed 1-39% bilateral stenosis. Note she was bradycardic on atenolol 50 mg daily.  Monitor May 2020 showed atrial fibrillation with PVCs or aberrantly conducted beats rate controlled.  Since last seen,   Current Outpatient Medications  Medication Sig Dispense Refill   apixaban (ELIQUIS) 5 MG TABS tablet Take 1 tablet (5 mg total) by mouth 2 (two) times daily. 60 tablet 3   Cholecalciferol (VITAMIN D PO) Take 1,000 Units by mouth daily.     ezetimibe (ZETIA) 10 MG tablet Take 1 tablet (10 mg total) by mouth daily. (Patient taking differently: Take 5 mg by mouth daily. For cholesterol) 30 tablet 3   furosemide (LASIX) 20 MG tablet Take 1 tablet (20 mg total) by mouth every other day as needed for fluid or edema. 30 tablet 0   gabapentin (NEURONTIN) 300 MG capsule Take 1 capsule (300 mg total) by mouth 3 (three) times daily. 90 capsule 3   hydrochlorothiazide (HYDRODIURIL) 12.5 MG tablet Take 12.5 mg by mouth daily.     HYDROcodone-acetaminophen (NORCO/VICODIN) 5-325 MG tablet Take 1 tablet by mouth every 6 (six) hours as needed for severe pain. 10 tablet 0   metFORMIN (GLUCOPHAGE) 500 MG tablet Take 500 mg by mouth daily.     methocarbamol (ROBAXIN) 500 MG tablet Take 1 tablet (500 mg total) by mouth every 6 (six) hours as needed for muscle spasms. 30 tablet 0   metoprolol succinate (TOPROL XL) 25 MG 24 hr tablet Take 0.5 tablets (12.5 mg total) by mouth daily. 90 tablet 3   Multiple Vitamins-Minerals (CENTRUM SILVER PO) Take 1 tablet by mouth daily.     olmesartan (BENICAR) 40 MG tablet Take 1 tablet (40 mg total) by mouth daily. 30 tablet 3   Omega-3 Fatty Acids  (FISH OIL PO) Take 1,200 mg by mouth daily.     ondansetron (ZOFRAN) 4 MG tablet Take 1 tablet (4 mg total) by mouth every 6 (six) hours as needed for nausea or vomiting. 10 tablet 0   pravastatin (PRAVACHOL) 20 MG tablet Take 1 tablet (20 mg total) by mouth daily. 30 tablet 0   vitamin B-12 (CYANOCOBALAMIN) 1000 MCG tablet Take 1,000 mcg by mouth daily.     No current facility-administered medications for this visit.     Past Medical History:  Diagnosis Date   Acid reflux    Arthritis    per patient "ankles, back, neck, leg, and shoulder"   Cancer ([place])    Diabetes mellitus    Fatty liver    Fibromyalgia    Hyperlipidemia    Hypertension    Lymphoma malignant, large cell (HCC)    Neuropathy    PAF (paroxysmal atrial fibrillation) ([place])    Stroke ([place])    Urinary tract infection 09/19/11   Being treated at this time Cipro    Past Surgical History:  Procedure Laterality Date   APPENDECTOMY     CATARACT EXTRACTION  2015   CHOLECYSTECTOMY     EYE SURGERY     JOINT REPLACEMENT     JOINT REPLACEMENT     LAMINECTOMY WITH POSTERIOR LATERAL ARTHRODESIS LEVEL 2 N/A 06/07/2019   Procedure: Laminectomy and Foraminotomy Lumbar three-Lumbar four  Lumbar four-Lumbar five  with non-instrumented fusion;  Surgeon: Eustace Moore, MD;  Location: [place];  Service: Neurosurgery;  Laterality: N/A;   ROTATOR CUFF REPAIR      Social History   Socioeconomic History   Marital status: Married    Spouse name: [name]    Number of children: 6   Years of education: 12   Highest education level: Not on file  Occupational History   Occupation: Retired   Tobacco Use   Smoking status: Never   Smokeless tobacco: Never  Vaping Use   Vaping Use: Never used  Substance and Sexual Activity   Alcohol use: Yes    Alcohol/week: 1.0 standard drink    Types: 1 Glasses of wine per week    Comment: Rare   Drug use: No   Sexual activity: Not on file  Other Topics Concern   Not on file  Social History  Narrative   Lives at home with husband, [name].   Caffeine use: 2-3 cups coffee per day   Social Determinants of Health   Financial Resource Strain: Not on file  Food Insecurity: Not on file  Transportation Needs: Not on file  Physical Activity: Not on file  Stress: Not on file  Social Connections: Not on file  Intimate Partner Violence: Not on file    Family History  Problem Relation Age of Onset   Heart Problems Brother    Cancer Sister     ROS: no fevers or chills, productive cough, hemoptysis, dysphasia, odynophagia, melena, hematochezia, dysuria, hematuria, rash, seizure activity, orthopnea, PND, pedal edema, claudication. Remaining systems are negative.  Physical Exam: Well-developed well-nourished in no acute distress.  Skin is warm and dry.  HEENT is normal.  Neck is supple.  Chest is clear to auscultation with normal expansion.  Cardiovascular exam is regular rate and rhythm.  Abdominal exam nontender or distended. No masses palpated. Extremities show no edema. neuro grossly intact  ECG- personally reviewed  A/P  1 permanent atrial fibrillation-continue Toprol for rate control.  Continue apixaban.  Check hemoglobin and renal function.  2 hypertension-blood pressure controlled.  Continue present medical regimen.  3 hyperlipidemia-continue statin.  Kirk Ruths, MD

## 2021-03-24 ENCOUNTER — Ambulatory Visit: Payer: Medicare Other | Admitting: Cardiology

## 2021-07-29 NOTE — Progress Notes (Signed)
HPI:FU atrial fibrillation. Patient admitted to [facility] in August 2016 with a TIA. Patient noted to be in atrial fib. She was treated with anticoagulation. Echocardiogram August 2016 showed normal LV function, restrictive filling, mild tricuspid regurgitation and trace mitral regurgitation. Carotid Dopplers August 2016 showed 1-39% bilateral stenosis. Note she was bradycardic on atenolol 50 mg daily.  Monitor May 2020 showed atrial fibrillation with PVCs or aberrantly conducted beats rate controlled.  Since last seen, there is no increased dyspnea, chest pain, palpitations, syncope or bleeding.  Current Outpatient Medications  Medication Sig Dispense Refill   apixaban (ELIQUIS) 5 MG TABS tablet Take 1 tablet (5 mg total) by mouth 2 (two) times daily. 60 tablet 3   Cholecalciferol (VITAMIN D PO) Take 1,000 Units by mouth daily.     ezetimibe (ZETIA) 10 MG tablet Take 1 tablet (10 mg total) by mouth daily. (Patient taking differently: Take 5 mg by mouth daily. For cholesterol) 30 tablet 3   furosemide (LASIX) 20 MG tablet Take 1 tablet (20 mg total) by mouth every other day as needed for fluid or edema. 30 tablet 0   gabapentin (NEURONTIN) 300 MG capsule Take 1 capsule (300 mg total) by mouth 3 (three) times daily. (Patient taking differently: Take 300 mg by mouth daily.) 90 capsule 3   hydrochlorothiazide (HYDRODIURIL) 12.5 MG tablet Take 12.5 mg by mouth daily.     metFORMIN (GLUCOPHAGE) 500 MG tablet Take 500 mg by mouth daily.     methocarbamol (ROBAXIN) 500 MG tablet Take 1 tablet (500 mg total) by mouth every 6 (six) hours as needed for muscle spasms. 30 tablet 0   metoprolol succinate (TOPROL XL) 25 MG 24 hr tablet Take 0.5 tablets (12.5 mg total) by mouth daily. 90 tablet 3   Multiple Vitamins-Minerals (CENTRUM SILVER PO) Take 1 tablet by mouth daily.     olmesartan (BENICAR) 40 MG tablet Take 1 tablet (40 mg total) by mouth daily. 30 tablet 3   Omega-3 Fatty Acids (FISH  OIL PO) Take 1,200 mg by mouth daily.     pravastatin (PRAVACHOL) 20 MG tablet Take 1 tablet (20 mg total) by mouth daily. 30 tablet 0   vitamin B-12 (CYANOCOBALAMIN) 1000 MCG tablet Take 1,000 mcg by mouth daily.     HYDROcodone-acetaminophen (NORCO/VICODIN) 5-325 MG tablet Take 1 tablet by mouth every 6 (six) hours as needed for severe pain. (Patient not taking: Reported on 08/11/2021) 10 tablet 0   ondansetron (ZOFRAN) 4 MG tablet Take 1 tablet (4 mg total) by mouth every 6 (six) hours as needed for nausea or vomiting. (Patient not taking: Reported on 08/11/2021) 10 tablet 0   No current facility-administered medications for this visit.     Past Medical History:  Diagnosis Date   Acid reflux    Arthritis    per patient "ankles, back, neck, leg, and shoulder"   Cancer ([place])    Diabetes mellitus    Fatty liver    Fibromyalgia    Hyperlipidemia    Hypertension    Lymphoma malignant, large cell (HCC)    Neuropathy    PAF (paroxysmal atrial fibrillation) ([place])    Stroke [facility])    Urinary tract infection 09/19/11   Being treated at this time Cipro    Past Surgical History:  Procedure Laterality Date   APPENDECTOMY     CATARACT EXTRACTION  2015   CHOLECYSTECTOMY     EYE SURGERY     JOINT REPLACEMENT  JOINT REPLACEMENT     LAMINECTOMY WITH POSTERIOR LATERAL ARTHRODESIS LEVEL 2 N/A 06/07/2019   Procedure: Laminectomy and Foraminotomy Lumbar three-Lumbar four  Lumbar four-Lumbar five with non-instrumented fusion;  Surgeon: Eustace Moore, MD;  Location: [place];  Service: Neurosurgery;  Laterality: N/A;   ROTATOR CUFF REPAIR      Social History   Socioeconomic History   Marital status: Married    Spouse name: [name]    Number of children: 6   Years of education: 12   Highest education level: Not on file  Occupational History   Occupation: Retired   Tobacco Use   Smoking status: Never   Smokeless tobacco: Never  Vaping Use   Vaping Use: Never used  Substance and Sexual  Activity   Alcohol use: Yes    Alcohol/week: 1.0 standard drink    Types: 1 Glasses of wine per week    Comment: Rare   Drug use: No   Sexual activity: Not on file  Other Topics Concern   Not on file  Social History Narrative   Lives at home with husband, [name].   Caffeine use: 2-3 cups coffee per day   Social Determinants of Health   Financial Resource Strain: Not on file  Food Insecurity: Not on file  Transportation Needs: Not on file  Physical Activity: Not on file  Stress: Not on file  Social Connections: Not on file  Intimate Partner Violence: Not on file    Family History  Problem Relation Age of Onset   Heart Problems Brother    Cancer Sister     ROS: no fevers or chills, productive cough, hemoptysis, dysphasia, odynophagia, melena, hematochezia, dysuria, hematuria, rash, seizure activity, orthopnea, PND, pedal edema, claudication. Remaining systems are negative.  Physical Exam: Well-developed well-nourished in no acute distress.  Skin is warm and dry.  HEENT is normal.  Neck is supple.  Chest is clear to auscultation with normal expansion.  Cardiovascular exam is irregular Abdominal exam nontender or distended. No masses palpated. Extremities show no edema. neuro grossly intact  ECG-atrial fibrillation at a rate of 69, left axis deviation, cannot rule out septal infarct, nonspecific ST changes.  Personally reviewed  A/P  1 permanent atrial fibrillation-continue Toprol at present dose for rate control.  Continue apixaban.  I will have most recent hemoglobin and creatinine forwarded from primary care.  2 hyperlipidemia-continue statin.  3 hypertension-blood pressure mildly elevated.  I have asked her to track this at home and we will add additional medications if needed.  Kirk Ruths, MD

## 2021-08-11 ENCOUNTER — Ambulatory Visit (INDEPENDENT_AMBULATORY_CARE_PROVIDER_SITE_OTHER): Payer: Medicare Other | Admitting: Cardiology

## 2021-08-11 ENCOUNTER — Other Ambulatory Visit: Payer: Self-pay

## 2021-08-11 ENCOUNTER — Encounter: Payer: Self-pay | Admitting: Cardiology

## 2021-08-11 VITALS — BP 146/88 | HR 69 | Ht <= 58 in | Wt 156.0 lb

## 2021-08-11 DIAGNOSIS — I482 Chronic atrial fibrillation, unspecified: Secondary | ICD-10-CM

## 2021-08-11 DIAGNOSIS — I10 Essential (primary) hypertension: Secondary | ICD-10-CM

## 2021-08-11 DIAGNOSIS — E785 Hyperlipidemia, unspecified: Secondary | ICD-10-CM | POA: Diagnosis not present

## 2021-08-11 NOTE — Patient Instructions (Signed)

## 2021-08-26 ENCOUNTER — Telehealth: Payer: Self-pay

## 2021-08-26 ENCOUNTER — Telehealth: Payer: Self-pay | Admitting: Cardiology

## 2021-08-26 DIAGNOSIS — Z01818 Encounter for other preprocedural examination: Secondary | ICD-10-CM

## 2021-08-26 NOTE — Telephone Encounter (Signed)
Patient with diagnosis of afib on Eliquis for anticoagulation.   ? ?Procedure: colonoscopy ?Date of procedure: 09/08/21 ? ?CHA2DS2-VASc Score = 8  ?This indicates a 10.8% annual risk of stroke. ?The patient's score is based upon: ?CHF History: 0 ?HTN History: 1 ?Diabetes History: 1 ?Stroke History: 2 ?Vascular Disease History: 1 ?Age Score: 2 ?Gender Score: 1 ?  ?Pt has not had BMET or CBC checked since 2021. These will ned to be checked before final clearance can be given. If her labs are stable, she should hold Eliquis for only 1 day prior to procedure given her elevated CV risk, and resume as soon as safely possible afterwards. ?

## 2021-08-26 NOTE — Telephone Encounter (Signed)
Contacted patient, she states she seen Dr.Mann (GI) today- notes in care everywhere and they recommended to try something else other than the benicar- and try something else.  ? ?She has had such bad diarrhea and is unaware of what could be causing it. I advised I would route to [facility], and MD to advise further.  ? ? ?

## 2021-08-26 NOTE — Telephone Encounter (Signed)
? ?  Pre-operative Risk Assessment  ?  ?Patient Name: [name]  ?DOB: Jul 15, 1940 ?MRN: [id number]  ? ?  ? ?Request for Surgical Clearance   ? ?Procedure:   Colonoscopy ? ?Date of Surgery:  Clearance 09/08/21                              ?   ?Surgeon:  Dr.Jyothi ?Surgeon's Group or Practice Name:  [facility] ?Phone number:  [phone] ?Fax number:  [phone] ?  ?Type of Clearance Requested:   ?- Medical  ?- Pharmacy:  Hold Apixaban (Eliquis)   ?  ?Type of Anesthesia:   Propofol ?  ?Additional requests/questions:  Please advise surgeon/provider what medications should be held. ?Please fax a copy of clearance to the surgeon's office. ? ?Signed, ?Thedore Mins Fynley Chrystal   ?08/26/2021, 3:24 PM  ? ?

## 2021-08-26 NOTE — Telephone Encounter (Signed)
Pt c/o medication issue: ? ?1. Name of Medication: olmesartan (BENICAR) 40 MG tablet ? ?2. How are you currently taking this medication (dosage and times per day)? 1 tablet daily ? ?3. Are you having a reaction (difficulty breathing--STAT)? no ? ?4. What is your medication issue? Patient states she has been having diarrhea and her PCP wants to know if she can be put on a different BP medication. ?

## 2021-08-26 NOTE — Telephone Encounter (Signed)
Preoperative team, Ms. Katzenberger needs to have a BMP and CBC drawn.  Once labs have been drawn pharmacy will be able to make recommendations on Eliquis hold.  Please contact patient and order the above labs.  Thank you for your help. ? ?Jossie Ng. Lavanda Nevels NP-C ? ?  ?08/26/2021, 4:18 PM ?[place] ?[address] ?Office [phone] Fax [phone] ? ?

## 2021-08-26 NOTE — Telephone Encounter (Signed)
I tried to call the pt to set up lab appt for pre op clearance, no answer nor vm came on ?

## 2021-08-27 MED ORDER — LOSARTAN POTASSIUM 100 MG PO TABS: 100.0000 mg | ORAL_TABLET | Freq: Every day | ORAL | 3 refills | Status: DC

## 2021-08-27 NOTE — Telephone Encounter (Signed)
Natalie Perla, MD  Cristopher Estimable, RN 22 hours ago (5:52 PM)  ? ?Benicar doesn't typically cause diarrhea; however; ok to DC and try losartan 100 mg daily  ?Kirk Ruths   ? ?Returned call to patient and advised patient of Dr. Jacalyn Lefevre recommendations.  Olmesartan discontinued and Losartan '100mg'$  daily sent to patient preferred pharmacy. Patient aware of all instructions and verbalized understanding.  ? ?Advised patient to call back to office with any issues, questions, or concerns. Patient verbalized understanding.  ? ?

## 2021-08-27 NOTE — Telephone Encounter (Signed)
1st attempt to reach pt regarding surgical clearance and the need for some lab work before clearance can be given. ?Left message for pt to call back. ?Orders are already in [place]. ?

## 2021-08-31 ENCOUNTER — Other Ambulatory Visit: Payer: Self-pay | Admitting: *Deleted

## 2021-08-31 DIAGNOSIS — Z01818 Encounter for other preprocedural examination: Secondary | ICD-10-CM

## 2021-08-31 NOTE — Telephone Encounter (Signed)
Lab orders have been released for [employer] at [facility] office ?

## 2021-08-31 NOTE — Telephone Encounter (Signed)
I called the pt back and explained to her that the orders for the lab are in the system, that the operator may not have known where to look. Pt states she will go for lab work tomorrow to the [place] office. Pt thanked me for the call and the help.  ?

## 2021-08-31 NOTE — Telephone Encounter (Signed)
Left message x 2 the pt is going to need to have lab work before she can be cleared, see previous notes. Jeanann Lewandowsky, RMA has placed lab orders for [facility] office where pt see's Dr. Stanford Breed. Left message for the pt to please call the office back and let us know when she is going to have the lab work per op clearance.  ? ?

## 2021-08-31 NOTE — Telephone Encounter (Signed)
Patient following up. I informed patient I do not see that lab orders have been placed. Once placed, she would like to know how soon she should come in for lab work. Please advise. ?

## 2021-09-02 LAB — CBC
Hematocrit: 42 % (ref 34.0–46.6)
Hemoglobin: 14.2 g/dL (ref 11.1–15.9)
MCH: 30.9 pg (ref 26.6–33.0)
MCHC: 33.8 g/dL (ref 31.5–35.7)
MCV: 92 fL (ref 79–97)
Platelets: 257 10*3/uL (ref 150–450)
RBC: 4.59 x10E6/uL (ref 3.77–5.28)
RDW: 13.2 % (ref 11.7–15.4)
WBC: 7.9 10*3/uL (ref 3.4–10.8)

## 2021-09-02 LAB — BASIC METABOLIC PANEL
BUN/Creatinine Ratio: 17 (ref 12–28)
BUN: 18 mg/dL (ref 8–27)
CO2: 24 mmol/L (ref 20–29)
Calcium: 9.8 mg/dL (ref 8.7–10.3)
Chloride: 101 mmol/L (ref 96–106)
Creatinine, Ser: 1.08 mg/dL — ABNORMAL HIGH (ref 0.57–1.00)
Glucose: 82 mg/dL (ref 70–99)
Potassium: 4.6 mmol/L (ref 3.5–5.2)
Sodium: 141 mmol/L (ref 134–144)
eGFR: 52 mL/min/{1.73_m2} — ABNORMAL LOW (ref >59)

## 2021-09-02 NOTE — Telephone Encounter (Signed)
Labs have resulted, CBC is stable with Plt 257K. BMET also stable, CrCl 46m/min. ? ?Pt should hold Eliquis for only 1 day prior to procedure given her elevated CV risk, and resume as soon as safely possible afterwards. ?

## 2021-09-03 NOTE — Telephone Encounter (Signed)
? ?  Primary Cardiologist: Kirk Ruths, MD ? ?Chart reviewed as part of pre-operative protocol coverage. Given past medical history and time since last visit, based on ACC/AHA guidelines, [name] would be at acceptable risk for the planned procedure without further cardiovascular testing.  ? ?Patient with diagnosis of afib on Eliquis for anticoagulation.   ?  ?Procedure: colonoscopy ?Date of procedure: 09/08/21 ?  ?CHA2DS2-VASc Score = 8  ?This indicates a 10.8% annual risk of stroke. ?The patient's score is based upon: ?CHF History: 0 ?HTN History: 1 ?Diabetes History: 1 ?Stroke History: 2 ?Vascular Disease History: 1 ?Age Score: 2 ?Gender Score: 1 ? ?Labs have resulted, CBC is stable with Plt 257K. BMET also stable, CrCl 100m/min. ?  ?Pt should hold Eliquis for only 1 day prior to procedure given her elevated CV risk, and resume as soon as safely possible afterwards. ? ? ?I will route this recommendation to the requesting party via Epic fax function and remove from pre-op pool. ? ?Please call with questions. ? ?[name] NP-C ? ?  ?09/03/2021, 2:47 PM ?[place]?[address] ?Office [phone] ([phone] ? ? ? ?

## 2022-04-04 ENCOUNTER — Encounter: Payer: Self-pay | Admitting: Infectious Disease

## 2022-04-04 ENCOUNTER — Other Ambulatory Visit: Payer: Self-pay

## 2022-04-04 ENCOUNTER — Ambulatory Visit (INDEPENDENT_AMBULATORY_CARE_PROVIDER_SITE_OTHER): Payer: Medicare Other | Admitting: Infectious Disease

## 2022-04-04 VITALS — BP 153/90 | HR 71 | Temp 97.6°F | Ht 58.5 in | Wt 159.0 lb

## 2022-04-04 DIAGNOSIS — N3941 Urge incontinence: Secondary | ICD-10-CM

## 2022-04-04 DIAGNOSIS — Z23 Encounter for immunization: Secondary | ICD-10-CM | POA: Diagnosis not present

## 2022-04-04 DIAGNOSIS — Z9889 Other specified postprocedural states: Secondary | ICD-10-CM

## 2022-04-04 DIAGNOSIS — N390 Urinary tract infection, site not specified: Secondary | ICD-10-CM

## 2022-04-04 DIAGNOSIS — I480 Paroxysmal atrial fibrillation: Secondary | ICD-10-CM | POA: Diagnosis not present

## 2022-04-04 DIAGNOSIS — I63411 Cerebral infarction due to embolism of right middle cerebral artery: Secondary | ICD-10-CM | POA: Diagnosis not present

## 2022-04-04 DIAGNOSIS — A498 Other bacterial infections of unspecified site: Secondary | ICD-10-CM | POA: Diagnosis not present

## 2022-04-04 DIAGNOSIS — Z1613 Resistance to carbapenem: Secondary | ICD-10-CM | POA: Insufficient documentation

## 2022-04-04 DIAGNOSIS — Z8742 Personal history of other diseases of the female genital tract: Secondary | ICD-10-CM

## 2022-04-04 HISTORY — DX: Other bacterial infections of unspecified site: A49.8

## 2022-04-04 HISTORY — DX: Personal history of other diseases of the female genital tract: Z87.42

## 2022-04-04 HISTORY — DX: Urge incontinence: N39.41

## 2022-04-04 NOTE — Addendum Note (Signed)
Addended by: Leatrice Jewels on: 04/04/2022 11:07 AM   Modules accepted: Orders

## 2022-04-04 NOTE — Progress Notes (Signed)
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  Action seen the patient back in 2011 when she was hospitalized with acute renal failure nausea vomiting and diarrhea.  During that hospitalization the cause of her symptoms was never clearly identified and we did an extensive work-up.  There was question of urinary tract infection based on CT scan suggesting pyelonephritis.  However the time her urine culture only was growing 15,000 forming units of Staphylococcus epidermidis.  In any case she recovered from that illness and had done well.  Since then as I mentioned though she has been developed problems with urinary incontinence and then over the last 6 months lower abdominal discomfort particularly when she has to get up to use the bathroom and also with ambulation.  She also suffers some spasm of the bladder as well.  She has had prior cystoscopy with insertion of stents but she and her husband believes stents have been removed since then.  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  She was recently given doxycycline that the organism is intermediate to tetracyclines and they do not penetrate the urine well anyway she was then given fosfomycin which also did not improve her  symptomatology.  Here is culture that was done at [facility] order:     It is not altogether clear that her colonization with this multidrug-resistant organism and its present in the bladder and the bladder is really driving much in the way of her symptoms.  I am certainly open to the idea of trying to treat it but if she still has CRE it will not be a simple matter to treat this as will require new or more expensive antibiotic to be procured and given.  I am skeptical even if we use this that we will achieve a long-term solution for her.  I think the better thing we can do is try to minimize antibiotics that are used for her to try to see if her flora may go back towards a last resistant population.  Her husband getting ready go on a cruise in roughly 3 weeks time  Past Medical History:  Diagnosis Date   Acid reflux    Arthritis    per patient "ankles, back, neck, leg, and shoulder"   Cancer ([place])    Diabetes mellitus    Fatty liver    Fibromyalgia    History of uterine prolapse 04/04/2022   Hyperlipidemia    Hypertension    Lymphoma malignant, large cell (HCC)    Neuropathy    PAF (paroxysmal atrial fibrillation) ([place])    Stroke ([place])    Urge incontinence 04/04/2022   Urinary tract infection 09/19/11   Being treated at this time Cipro    Past Surgical History:  Procedure Laterality Date   APPENDECTOMY     CATARACT EXTRACTION  2015   CHOLECYSTECTOMY     EYE SURGERY  JOINT REPLACEMENT     JOINT REPLACEMENT     LAMINECTOMY WITH POSTERIOR LATERAL ARTHRODESIS LEVEL 2 N/A 06/07/2019   Procedure: Laminectomy and Foraminotomy Lumbar three-Lumbar four  Lumbar four-Lumbar five with non-instrumented fusion;  Surgeon: Eustace Moore, MD;  Location: [facility];  Service: Neurosurgery;  Laterality: N/A;   ROTATOR CUFF REPAIR      Family History  Problem Relation Age of Onset   Heart Problems Brother    Cancer Sister       Social History   Socioeconomic History   Marital  status: Married    Spouse name: [name]    Number of children: 6   Years of education: 12   Highest education level: Not on file  Occupational History   Occupation: Retired   Tobacco Use   Smoking status: Never   Smokeless tobacco: Never  Vaping Use   Vaping Use: Never used  Substance and Sexual Activity   Alcohol use: Yes    Alcohol/week: 1.0 standard drink of alcohol    Types: 1 Glasses of wine per week    Comment: Rare   Drug use: No   Sexual activity: Not on file  Other Topics Concern   Not on file  Social History Narrative   Lives at home with husband, [name].   Caffeine use: 2-3 cups coffee per day   Social Determinants of Health   Financial Resource Strain: Not on file  Food Insecurity: Not on file  Transportation Needs: Not on file  Physical Activity: Not on file  Stress: Not on file  Social Connections: Not on file    No Active Allergies   Current Outpatient Medications:    Albuterol Sulfate (PROAIR RESPICLICK) 478 (90 Base) MCG/ACT AEPB, , Disp: , Rfl:    allopurinol (ZYLOPRIM) 100 MG tablet, Take 100 mg by mouth daily., Disp: , Rfl:    apixaban (ELIQUIS) 5 MG TABS tablet, Take 1 tablet (5 mg total) by mouth 2 (two) times daily., Disp: 60 tablet, Rfl: 3   Cholecalciferol (VITAMIN D PO), Take 1,000 Units by mouth daily., Disp: , Rfl:    ezetimibe (ZETIA) 10 MG tablet, Take 1 tablet (10 mg total) by mouth daily. (Patient taking differently: Take 5 mg by mouth daily. For cholesterol), Disp: 30 tablet, Rfl: 3   furosemide (LASIX) 20 MG tablet, Take 1 tablet (20 mg total) by mouth every other day as needed for fluid or edema., Disp: 30 tablet, Rfl: 0   gabapentin (NEURONTIN) 300 MG capsule, Take 1 capsule (300 mg total) by mouth 3 (three) times daily. (Patient taking differently: Take 300 mg by mouth daily.), Disp: 90 capsule, Rfl: 3   hydrochlorothiazide (HYDRODIURIL) 12.5 MG tablet, Take 12.5 mg by mouth daily., Disp: , Rfl:    metFORMIN (GLUCOPHAGE) 500 MG tablet,  Take 500 mg by mouth daily., Disp: , Rfl:    methocarbamol (ROBAXIN) 500 MG tablet, Take 1 tablet (500 mg total) by mouth every 6 (six) hours as needed for muscle spasms., Disp: 30 tablet, Rfl: 0   metoprolol succinate (TOPROL XL) 25 MG 24 hr tablet, Take 0.5 tablets (12.5 mg total) by mouth daily., Disp: 90 tablet, Rfl: 3   Multiple Vitamins-Minerals (CENTRUM SILVER PO), Take 1 tablet by mouth daily., Disp: , Rfl:    olmesartan (BENICAR) 40 MG tablet, Take 40 mg by mouth daily., Disp: , Rfl:    Omega-3 Fatty Acids (FISH OIL PO), Take 1,200 mg by mouth daily., Disp: , Rfl:    ondansetron (ZOFRAN)  4 MG tablet, Take 1 tablet (4 mg total) by mouth every 6 (six) hours as needed for nausea or vomiting., Disp: 10 tablet, Rfl: 0   pravastatin (PRAVACHOL) 20 MG tablet, Take 1 tablet (20 mg total) by mouth daily., Disp: 30 tablet, Rfl: 0   vitamin B-12 (CYANOCOBALAMIN) 1000 MCG tablet, Take 1,000 mcg by mouth daily., Disp: , Rfl:    traMADol (ULTRAM) 50 MG tablet, Take 1 tablet by mouth every 6 (six) hours as needed., Disp: , Rfl:   Review of Systems  Constitutional:  Negative for activity change, appetite change, chills, diaphoresis, fatigue, fever and unexpected weight change.  HENT:  Negative for congestion, rhinorrhea, sinus pressure, sneezing, sore throat and trouble swallowing.   Eyes:  Negative for photophobia and visual disturbance.  Respiratory:  Negative for cough, chest tightness, shortness of breath, wheezing and stridor.   Cardiovascular:  Negative for chest pain, palpitations and leg swelling.  Gastrointestinal:  Positive for abdominal pain. Negative for abdominal distention, anal bleeding, blood in stool, constipation, diarrhea, nausea and vomiting.  Genitourinary:  Positive for frequency and urgency. Negative for difficulty urinating, dysuria, flank pain and hematuria.  Musculoskeletal:  Negative for arthralgias, back pain, gait problem, joint swelling and myalgias.  Skin:  Negative for  color change, pallor, rash and wound.  Neurological:  Negative for dizziness, tremors, weakness and light-headedness.  Hematological:  Negative for adenopathy. Does not bruise/bleed easily.  Psychiatric/Behavioral:  Negative for agitation, behavioral problems, confusion, decreased concentration, dysphoric mood and sleep disturbance.        Objective:   Physical Exam Constitutional:      General: She is not in acute distress.    Appearance: Normal appearance. She is well-developed. She is not ill-appearing or diaphoretic.  HENT:     Head: Normocephalic and atraumatic.     Right Ear: Hearing and external ear normal.     Left Ear: Hearing and external ear normal.     Nose: No nasal deformity or rhinorrhea.  Eyes:     General: No scleral icterus.    Conjunctiva/sclera: Conjunctivae normal.     Right eye: Right conjunctiva is not injected.     Left eye: Left conjunctiva is not injected.     Pupils: Pupils are equal, round, and reactive to light.  Neck:     Vascular: No JVD.  Cardiovascular:     Rate and Rhythm: Normal rate and regular rhythm.     Heart sounds: S1 normal and S2 normal.  Pulmonary:     Effort: Pulmonary effort is normal. No respiratory distress.     Breath sounds: No wheezing.  Abdominal:     General: Bowel sounds are normal. There is no distension.     Palpations: Abdomen is soft.     Tenderness: There is no abdominal tenderness.  Musculoskeletal:        General: Normal range of motion.     Right shoulder: Normal.     Left shoulder: Normal.     Cervical back: Normal range of motion and neck supple.     Right hip: Normal.     Left hip: Normal.     Right knee: Normal.     Left knee: Normal.  Lymphadenopathy:     Head:     Right side of head: No submandibular, preauricular or posterior auricular adenopathy.     Left side of head: No submandibular, preauricular or posterior auricular adenopathy.     Cervical: No cervical adenopathy.     Right  cervical: No  superficial or deep cervical adenopathy.    Left cervical: No superficial or deep cervical adenopathy.  Skin:    General: Skin is warm and dry.     Coloration: Skin is not pale.     Findings: No abrasion, bruising, ecchymosis, erythema, lesion or rash.     Nails: There is no clubbing.  Neurological:     Mental Status: She is alert and oriented to person, place, and time.     Sensory: No sensory deficit.     Coordination: Coordination normal.     Gait: Gait normal.  Psychiatric:        Attention and Perception: She is attentive.        Mood and Affect: Mood normal.        Speech: Speech normal.        Behavior: Behavior normal. Behavior is cooperative.        Thought Content: Thought content normal.        Judgment: Judgment normal.           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  We will recheck a CMP CBC with differential and a urinalysis with urine culture.  I think the most prudent thing for her is actually not to give antibiotics but to restrain ourselves and hope that without being given antibiotics that are applying selective pressure on the organisms in her urine that a less resistant micro will become the predominant one in her bladder.  I am not unwilling to try to treat the CRE though this would require an antibiotic such as Zadie Rhine that will undoubtedly require prior authorization and not be cheap  We will get the data today and for now plan on seeing her in 6 weeks time and less there is pressing need to treat her before then.  I spent 82 minutes with the patient including than 50% of the time in face to face counseling of the patient and her husband regarding the nature of CRE regarding the nature of multidrug-resistant organisms in the urine and how they develop and how they can persist in particular in the face of  selective antibiotic pressure,  along with review of medical records in preparation for the visit and during the visit and in coordination of her care.

## 2022-04-04 NOTE — Addendum Note (Signed)
Addended by: Leatrice Jewels on: 04/04/2022 11:06 AM   Modules accepted: Orders

## 2022-04-05 LAB — CBC WITH DIFFERENTIAL/PLATELET
Absolute Monocytes: 604 cells/uL (ref 200–950)
Basophils Absolute: 28 cells/uL (ref 0–200)
Basophils Relative: 0.4 %
Eosinophils Absolute: 213 cells/uL (ref 15–500)
Eosinophils Relative: 3 %
HCT: 38.2 % (ref 35.0–45.0)
Hemoglobin: 12.8 g/dL (ref 11.7–15.5)
Lymphs Abs: 2741 cells/uL (ref 850–3900)
MCH: 31.8 pg (ref 27.0–33.0)
MCHC: 33.5 g/dL (ref 32.0–36.0)
MCV: 94.8 fL (ref 80.0–100.0)
MPV: 10.8 fL (ref 7.5–12.5)
Monocytes Relative: 8.5 %
Neutro Abs: 3515 cells/uL (ref 1500–7800)
Neutrophils Relative %: 49.5 %
Platelets: 258 10*3/uL (ref 140–400)
RBC: 4.03 10*6/uL (ref 3.80–5.10)
RDW: 13.1 % (ref 11.0–15.0)
Total Lymphocyte: 38.6 %
WBC: 7.1 10*3/uL (ref 3.8–10.8)

## 2022-04-05 LAB — COMPLETE METABOLIC PANEL WITH GFR
AG Ratio: 1.3 (calc) (ref 1.0–2.5)
ALT: 18 U/L (ref 6–29)
AST: 18 U/L (ref 10–35)
Albumin: 4.2 g/dL (ref 3.6–5.1)
Alkaline phosphatase (APISO): 98 U/L (ref 37–153)
BUN: 21 mg/dL (ref 7–25)
CO2: 27 mmol/L (ref 20–32)
Calcium: 9.8 mg/dL (ref 8.6–10.4)
Chloride: 106 mmol/L (ref 98–110)
Creat: 0.95 mg/dL (ref 0.60–0.95)
Globulin: 3.2 g/dL (calc) (ref 1.9–3.7)
Glucose, Bld: 132 mg/dL — ABNORMAL HIGH (ref 65–99)
Potassium: 4.3 mmol/L (ref 3.5–5.3)
Sodium: 141 mmol/L (ref 135–146)
Total Bilirubin: 1.3 mg/dL — ABNORMAL HIGH (ref 0.2–1.2)
Total Protein: 7.4 g/dL (ref 6.1–8.1)
eGFR: 60 mL/min/{1.73_m2} (ref >=60)

## 2022-04-07 ENCOUNTER — Other Ambulatory Visit: Payer: Self-pay | Admitting: Family Medicine

## 2022-04-07 DIAGNOSIS — Z1231 Encounter for screening mammogram for malignant neoplasm of breast: Secondary | ICD-10-CM

## 2022-04-08 LAB — URINALYSIS, ROUTINE W REFLEX MICROSCOPIC
Bilirubin Urine: NEGATIVE
Glucose, UA: NEGATIVE
Ketones, ur: NEGATIVE
Nitrite: POSITIVE — AB
RBC / HPF: NONE SEEN /HPF (ref 0–2)
Specific Gravity, Urine: 1.012 (ref 1.001–1.035)
pH: 6 (ref 5.0–8.0)

## 2022-04-08 LAB — URINE CULTURE
MICRO NUMBER:: 14056751
SPECIMEN QUALITY:: ADEQUATE

## 2022-04-08 LAB — MICROSCOPIC MESSAGE

## 2022-04-11 ENCOUNTER — Telehealth: Payer: Self-pay

## 2022-04-11 NOTE — Telephone Encounter (Signed)
Patient called asking if she can get in a hot tub/pool during a cruise she is going on for 60 year anniversary next week. Advised Dr Tommy Medal thinks it may not be a good idea given she already has Super R Bacteria and he can not be certain that she will not spread this to others or that other bacteria's will not colonize in her infection and cause more severe issues. Patient understood.

## 2022-04-11 NOTE — Progress Notes (Signed)
I would tend to agree!

## 2022-05-16 ENCOUNTER — Encounter (HOSPITAL_COMMUNITY): Payer: Self-pay

## 2022-05-16 ENCOUNTER — Ambulatory Visit (INDEPENDENT_AMBULATORY_CARE_PROVIDER_SITE_OTHER): Payer: Medicare Other | Admitting: Infectious Disease

## 2022-05-16 ENCOUNTER — Encounter: Payer: Self-pay | Admitting: Infectious Disease

## 2022-05-16 ENCOUNTER — Other Ambulatory Visit: Payer: Self-pay

## 2022-05-16 VITALS — BP 111/72 | HR 79 | Temp 97.4°F

## 2022-05-16 DIAGNOSIS — Z1613 Resistance to carbapenem: Secondary | ICD-10-CM

## 2022-05-16 DIAGNOSIS — N3941 Urge incontinence: Secondary | ICD-10-CM | POA: Diagnosis not present

## 2022-05-16 DIAGNOSIS — R5383 Other fatigue: Secondary | ICD-10-CM

## 2022-05-16 DIAGNOSIS — R61 Generalized hyperhidrosis: Secondary | ICD-10-CM | POA: Diagnosis not present

## 2022-05-16 DIAGNOSIS — G4710 Hypersomnia, unspecified: Secondary | ICD-10-CM

## 2022-05-16 DIAGNOSIS — A498 Other bacterial infections of unspecified site: Secondary | ICD-10-CM | POA: Diagnosis not present

## 2022-05-16 DIAGNOSIS — N3000 Acute cystitis without hematuria: Secondary | ICD-10-CM

## 2022-05-16 DIAGNOSIS — R531 Weakness: Secondary | ICD-10-CM | POA: Insufficient documentation

## 2022-05-16 HISTORY — DX: Other fatigue: R53.83

## 2022-05-16 HISTORY — DX: Hypersomnia, unspecified: G47.10

## 2022-05-16 HISTORY — DX: Generalized hyperhidrosis: R61

## 2022-05-16 NOTE — Progress Notes (Signed)
Subjective:  Chief complaint worsening fatigue sweats at night and sleeping more than usual  Patient ID: [name], female    DOB: 02-21-1941, 81 y.o.   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  Action seen the patient back in 2011 when she was hospitalized with acute renal failure nausea vomiting and diarrhea.  During that hospitalization the cause of her symptoms was never clearly identified and we did an extensive work-up.  There was question of urinary tract infection based on CT scan suggesting pyelonephritis.  However the time her urine culture only was growing 15,000 forming units of Staphylococcus epidermidis.  In any case she recovered from that illness and had done well.  Since then as I mentioned though she has been developed problems with urinary incontinence and then over the last 6 months lower abdominal discomfort particularly when she has to get up to use the bathroom and also with ambulation.  She also suffers some spasm of the bladder as well.  She has had prior cystoscopy with insertion of stents but she and her husband believes stents have been removed since then.  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  She was recently given doxycycline that the organism is intermediate to tetracyclines and they do not penetrate the urine well anyway she was then given fosfomycin which also did not improve her symptomatology.  Here is culture that was done at [facility] order:     It was not altogether clear that her colonization with this multidrug-resistant organism and its  present in the bladder and the bladder is really driving much in the way of her symptoms.  I was certainly open to the idea of trying to treat it but if she still has CRE it will not be a simple matter to treat this as will require new or more expensive antibiotic to be procured and given IV.  I am skeptical even if we use this that we will achieve a long-term solution for her.  I think the better thing we can do is try to minimize antibiotics that are used for her to try to see if her flora may go back towards a last resistant population.  I saw them they were getting ready go on a cruise.  We did reculture her urine and again isolated a Carbapenem resistant Klebsiella pneumonia.   Unfortunate the cruise did not go very well.  They went with another couple 1 of whom fell and broke her ankle.  Catheter self suffered from her urge incontinence and frequent urination.  She also was having night sweats at night and still is having them.  She becoming progressively more fatigued.  She is sleeping much more than usual.  Her husband also when I asked him says that she is a bit more confused than normal.  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   Diabetes mellitus    Fatty liver    Fibromyalgia    History of uterine prolapse 04/04/2022   Hyperlipidemia    Hypertension    Lymphoma malignant, large cell (HCC)    Neuropathy    PAF (paroxysmal atrial fibrillation) (HCC)    Stroke ([place])    Urge incontinence 04/04/2022   Urinary tract infection 09/19/11   Being treated at this time Cipro    Past Surgical History:  Procedure Laterality  Date   APPENDECTOMY     CATARACT EXTRACTION  2015   CHOLECYSTECTOMY     EYE SURGERY     JOINT REPLACEMENT     JOINT REPLACEMENT     LAMINECTOMY WITH POSTERIOR LATERAL ARTHRODESIS LEVEL 2 N/A 06/07/2019   Procedure: Laminectomy and Foraminotomy Lumbar three-Lumbar four  Lumbar four-Lumbar five with non-instrumented fusion;  Surgeon: Eustace Moore, MD;  Location: [facility];  Service: Neurosurgery;  Laterality: N/A;   ROTATOR CUFF REPAIR      Family History  Problem Relation Age of Onset   Heart Problems Brother    Cancer Sister       Social History   Socioeconomic History   Marital status: Married    Spouse name: [name]    Number of children: 6   Years of education: 12   Highest education level: Not on file  Occupational History   Occupation: Retired   Tobacco Use   Smoking status: Never   Smokeless tobacco: Never  Vaping Use   Vaping Use: Never used  Substance and Sexual Activity   Alcohol use: Yes    Alcohol/week: 1.0 standard drink of alcohol    Types: 1 Glasses of wine per week    Comment: Rare   Drug use: No   Sexual activity: Not on file  Other Topics Concern   Not on file  Social History Narrative   Lives at home with husband, [name].   Caffeine use: 2-3 cups coffee per day   Social Determinants of Health   Financial Resource Strain: Not on file  Food Insecurity: Not on file  Transportation Needs: Not on file  Physical Activity: Not on file  Stress: Not on file  Social Connections: Not on file    No Active Allergies   Current Outpatient Medications:    Albuterol Sulfate (PROAIR RESPICLICK) 094 (90 Base) MCG/ACT AEPB, , Disp: , Rfl:    allopurinol (ZYLOPRIM) 100 MG tablet, Take 100 mg by mouth daily., Disp: , Rfl:    apixaban (ELIQUIS) 5 MG TABS tablet, Take 1 tablet (5 mg total) by mouth 2 (two) times daily., Disp: 60 tablet, Rfl: 3   Cholecalciferol (VITAMIN D PO), Take 1,000 Units by mouth daily., Disp: , Rfl:    ezetimibe (ZETIA) 10 MG tablet, Take 1  tablet (10 mg total) by mouth daily. (Patient taking differently: Take 5 mg by mouth daily. For cholesterol), Disp: 30 tablet, Rfl: 3   furosemide (LASIX) 20 MG tablet, Take 1 tablet (20 mg total) by mouth every other day as needed for fluid or edema., Disp: 30 tablet, Rfl: 0   gabapentin (NEURONTIN) 300 MG capsule, Take 1 capsule (300 mg total) by mouth 3 (three) times daily. (Patient taking differently: Take 300 mg by mouth daily.), Disp: 90 capsule, Rfl: 3   hydrochlorothiazide (HYDRODIURIL) 12.5 MG tablet, Take 12.5 mg by mouth daily., Disp: , Rfl:    metFORMIN (GLUCOPHAGE) 500 MG tablet, Take 500 mg by  mouth daily., Disp: , Rfl:    methocarbamol (ROBAXIN) 500 MG tablet, Take 1 tablet (500 mg total) by mouth every 6 (six) hours as needed for muscle spasms., Disp: 30 tablet, Rfl: 0   metoprolol succinate (TOPROL XL) 25 MG 24 hr tablet, Take 0.5 tablets (12.5 mg total) by mouth daily., Disp: 90 tablet, Rfl: 3   Multiple Vitamins-Minerals (CENTRUM SILVER PO), Take 1 tablet by mouth daily., Disp: , Rfl:    olmesartan (BENICAR) 40 MG tablet, Take 40 mg by mouth daily., Disp: , Rfl:    Omega-3 Fatty Acids (FISH OIL PO), Take 1,200 mg by mouth daily., Disp: , Rfl:    ondansetron (ZOFRAN) 4 MG tablet, Take 1 tablet (4 mg total) by mouth every 6 (six) hours as needed for nausea or vomiting., Disp: 10 tablet, Rfl: 0   pravastatin (PRAVACHOL) 20 MG tablet, Take 1 tablet (20 mg total) by mouth daily., Disp: 30 tablet, Rfl: 0   traMADol (ULTRAM) 50 MG tablet, Take 1 tablet by mouth every 6 (six) hours as needed., Disp: , Rfl:    vitamin B-12 (CYANOCOBALAMIN) 1000 MCG tablet, Take 1,000 mcg by mouth daily., Disp: , Rfl:   Review of Systems  Constitutional:  Positive for activity change, appetite change and fatigue. Negative for chills, diaphoresis, fever and unexpected weight change.  HENT:  Negative for congestion, rhinorrhea, sinus pressure, sneezing, sore throat and trouble swallowing.   Eyes:  Negative  for photophobia and visual disturbance.  Respiratory:  Negative for cough, chest tightness, shortness of breath, wheezing and stridor.   Cardiovascular:  Negative for chest pain, palpitations and leg swelling.  Gastrointestinal:  Negative for abdominal distention, abdominal pain, anal bleeding, blood in stool, constipation, diarrhea, nausea and vomiting.  Genitourinary:  Positive for frequency and pelvic pain. Negative for difficulty urinating, dysuria, flank pain and hematuria.  Musculoskeletal:  Negative for arthralgias, back pain, gait problem, joint swelling and myalgias.  Skin:  Negative for color change, pallor, rash and wound.  Neurological:  Positive for weakness. Negative for dizziness, tremors and light-headedness.  Hematological:  Negative for adenopathy. Does not bruise/bleed easily.  Psychiatric/Behavioral:  Positive for confusion. Negative for agitation, behavioral problems, decreased concentration, dysphoric mood and sleep disturbance.        Objective:   Physical Exam Constitutional:      General: She is not in acute distress.    Appearance: Normal appearance. She is well-developed. She is not ill-appearing or diaphoretic.  HENT:     Head: Normocephalic and atraumatic.     Right Ear: Hearing and external ear normal.     Left Ear: Hearing and external ear normal.     Nose: No nasal deformity or rhinorrhea.  Eyes:     General: No scleral icterus.    Conjunctiva/sclera: Conjunctivae normal.     Right eye: Right conjunctiva is not injected.     Left eye: Left conjunctiva is not injected.     Pupils: Pupils are equal, round, and reactive to light.  Neck:     Vascular: No JVD.  Cardiovascular:     Rate and Rhythm: Normal rate and regular rhythm.     Heart sounds: Normal heart sounds, S1 normal and S2 normal. No murmur heard.    No friction rub.  Abdominal:     General: Bowel sounds are normal. There is no distension.     Palpations: Abdomen is soft.     Tenderness:  There is no abdominal tenderness.  Musculoskeletal:  General: Normal range of motion.     Right shoulder: Normal.     Left shoulder: Normal.     Cervical back: Normal range of motion and neck supple.     Right hip: Normal.     Left hip: Normal.     Right knee: Normal.     Left knee: Normal.  Lymphadenopathy:     Head:     Right side of head: No submandibular, preauricular or posterior auricular adenopathy.     Left side of head: No submandibular, preauricular or posterior auricular adenopathy.     Cervical: No cervical adenopathy.     Right cervical: No superficial or deep cervical adenopathy.    Left cervical: No superficial or deep cervical adenopathy.  Skin:    General: Skin is warm and dry.     Coloration: Skin is pale.     Findings: No abrasion, bruising, ecchymosis, erythema, lesion or rash.     Nails: There is no clubbing.  Neurological:     General: No focal deficit present.     Mental Status: She is alert and oriented to person, place, and time.     Sensory: No sensory deficit.     Coordination: Coordination normal.     Gait: Gait normal.  Psychiatric:        Attention and Perception: She is attentive.        Mood and Affect: Mood normal.        Speech: Speech normal.        Behavior: Behavior normal. Behavior is cooperative.        Thought Content: Thought content normal.        Judgment: Judgment normal.           Assessment & Plan:  CRE colonization:  Has been difficult to separate out acute infectious pathology from chronic symptoms her current symptoms of excessive fatigue increased sleeping make me quite concerned that CRE could be behind the symptoms and she should get prompt treatment  Hemodynamically stable here in the clinic.  Tried hospitalist have graciously agreed to admit the patient I have counseled her and her husband that should her condition worsen at all she would need to go to the emergency department for admission.  He and the  patient agreed to plan for direct admission and they have an daughter who is a nurse who will also stay at home with her until she has a bed.  When she was admitted I would make recommend getting a set of blood cultures a CMP a CBC with differential as well as a urine analysis and culture.  After the cultures have been properly obtained I would initiate Zadie Rhine  I will let my  partners know she is going to be admitted to the hospital  I spent 42 minutes with the patient including than 50% of the time in face to face counseling of the patient husband with regards to her CRE colonization now with concerns for infection, along with review of medical records in preparation for the visit and during the visit and in coordination of her care with Dr. Tamala Julian.  Vaccine counseling:

## 2022-05-16 NOTE — Plan of Care (Signed)
Direct admission from ID office. [name] is a 81 year old female with HTN, HLD, PAF, large cell lymphoma s/p chemotherapy, uterine prolapse with spastic bladder and urinary incontinence who has been following with urology and recently ID after urine cultures revealed Carbapenem resistant Enterobacteriaceae -Klebsiella pneumonia.  Patient had just recently went on a cruise with reports of night sweats and increased lethargy.  Seen in office today for which treatment is thought to be indicated with Avycaz.  Vital signs were reported to be stable.  Admitted to a medical telemetry bed.  Consult on-call ID on arrival and check blood and urine cultures.

## 2022-05-17 ENCOUNTER — Other Ambulatory Visit: Payer: Self-pay

## 2022-05-17 ENCOUNTER — Telehealth: Payer: Self-pay

## 2022-05-17 ENCOUNTER — Inpatient Hospital Stay (HOSPITAL_COMMUNITY)
Admission: AD | Admit: 2022-05-17 | Discharge: 2022-05-20 | DRG: 690 | Disposition: A | Discharge status: Home Health | Payer: Medicare Other | Source: Ambulatory Visit | Attending: Internal Medicine | Admitting: Internal Medicine

## 2022-05-17 ENCOUNTER — Encounter (HOSPITAL_COMMUNITY): Payer: Self-pay | Admitting: Internal Medicine

## 2022-05-17 DIAGNOSIS — Z9049 Acquired absence of other specified parts of digestive tract: Secondary | ICD-10-CM | POA: Diagnosis not present

## 2022-05-17 DIAGNOSIS — Z8572 Personal history of non-Hodgkin lymphomas: Secondary | ICD-10-CM | POA: Diagnosis not present

## 2022-05-17 DIAGNOSIS — J989 Respiratory disorder, unspecified: Secondary | ICD-10-CM

## 2022-05-17 DIAGNOSIS — Z8673 Personal history of transient ischemic attack (TIA), and cerebral infarction without residual deficits: Secondary | ICD-10-CM

## 2022-05-17 DIAGNOSIS — Z87440 Personal history of urinary (tract) infections: Secondary | ICD-10-CM

## 2022-05-17 DIAGNOSIS — Z79899 Other long term (current) drug therapy: Secondary | ICD-10-CM | POA: Diagnosis not present

## 2022-05-17 DIAGNOSIS — B961 Klebsiella pneumoniae [K. pneumoniae] as the cause of diseases classified elsewhere: Secondary | ICD-10-CM | POA: Diagnosis present

## 2022-05-17 DIAGNOSIS — B348 Other viral infections of unspecified site: Secondary | ICD-10-CM

## 2022-05-17 DIAGNOSIS — R319 Hematuria, unspecified: Secondary | ICD-10-CM

## 2022-05-17 DIAGNOSIS — I10 Essential (primary) hypertension: Secondary | ICD-10-CM | POA: Diagnosis present

## 2022-05-17 DIAGNOSIS — E1129 Type 2 diabetes mellitus with other diabetic kidney complication: Secondary | ICD-10-CM

## 2022-05-17 DIAGNOSIS — Z9221 Personal history of antineoplastic chemotherapy: Secondary | ICD-10-CM

## 2022-05-17 DIAGNOSIS — Z8742 Personal history of other diseases of the female genital tract: Secondary | ICD-10-CM | POA: Diagnosis not present

## 2022-05-17 DIAGNOSIS — Z1152 Encounter for screening for COVID-19: Secondary | ICD-10-CM | POA: Diagnosis not present

## 2022-05-17 DIAGNOSIS — Z7901 Long term (current) use of anticoagulants: Secondary | ICD-10-CM

## 2022-05-17 DIAGNOSIS — E119 Type 2 diabetes mellitus without complications: Secondary | ICD-10-CM

## 2022-05-17 DIAGNOSIS — B9689 Other specified bacterial agents as the cause of diseases classified elsewhere: Secondary | ICD-10-CM | POA: Diagnosis not present

## 2022-05-17 DIAGNOSIS — Z981 Arthrodesis status: Secondary | ICD-10-CM

## 2022-05-17 DIAGNOSIS — E669 Obesity, unspecified: Secondary | ICD-10-CM | POA: Diagnosis present

## 2022-05-17 DIAGNOSIS — N390 Urinary tract infection, site not specified: Principal | ICD-10-CM | POA: Diagnosis present

## 2022-05-17 DIAGNOSIS — G609 Hereditary and idiopathic neuropathy, unspecified: Secondary | ICD-10-CM | POA: Diagnosis present

## 2022-05-17 DIAGNOSIS — E785 Hyperlipidemia, unspecified: Secondary | ICD-10-CM | POA: Diagnosis present

## 2022-05-17 DIAGNOSIS — Z8701 Personal history of pneumonia (recurrent): Secondary | ICD-10-CM

## 2022-05-17 DIAGNOSIS — E1140 Type 2 diabetes mellitus with diabetic neuropathy, unspecified: Secondary | ICD-10-CM | POA: Diagnosis present

## 2022-05-17 DIAGNOSIS — I480 Paroxysmal atrial fibrillation: Secondary | ICD-10-CM | POA: Diagnosis present

## 2022-05-17 DIAGNOSIS — K760 Fatty (change of) liver, not elsewhere classified: Secondary | ICD-10-CM | POA: Diagnosis present

## 2022-05-17 DIAGNOSIS — N3000 Acute cystitis without hematuria: Secondary | ICD-10-CM | POA: Diagnosis not present

## 2022-05-17 DIAGNOSIS — Z6831 Body mass index (BMI) 31.0-31.9, adult: Secondary | ICD-10-CM | POA: Diagnosis not present

## 2022-05-17 DIAGNOSIS — M797 Fibromyalgia: Secondary | ICD-10-CM | POA: Diagnosis present

## 2022-05-17 DIAGNOSIS — E876 Hypokalemia: Secondary | ICD-10-CM | POA: Diagnosis not present

## 2022-05-17 DIAGNOSIS — Z1613 Resistance to carbapenem: Secondary | ICD-10-CM

## 2022-05-17 DIAGNOSIS — Z7984 Long term (current) use of oral hypoglycemic drugs: Secondary | ICD-10-CM

## 2022-05-17 HISTORY — DX: Cerebral infarction due to embolism of right middle cerebral artery: I63.411

## 2022-05-17 LAB — COMPREHENSIVE METABOLIC PANEL
ALT: 22 U/L (ref 0–44)
AST: 26 U/L (ref 15–41)
Albumin: 3.1 g/dL — ABNORMAL LOW (ref 3.5–5.0)
Alkaline Phosphatase: 67 U/L (ref 38–126)
Anion gap: 9 (ref 5–15)
BUN: 27 mg/dL — ABNORMAL HIGH (ref 8–23)
CO2: 24 mmol/L (ref 22–32)
Calcium: 9 mg/dL (ref 8.9–10.3)
Chloride: 103 mmol/L (ref 98–111)
Creatinine, Ser: 1.33 mg/dL — ABNORMAL HIGH (ref 0.44–1.00)
GFR, Estimated: 40 mL/min — ABNORMAL LOW (ref >60)
Glucose, Bld: 107 mg/dL — ABNORMAL HIGH (ref 70–99)
Potassium: 3.8 mmol/L (ref 3.5–5.1)
Sodium: 136 mmol/L (ref 135–145)
Total Bilirubin: 0.7 mg/dL (ref 0.3–1.2)
Total Protein: 7 g/dL (ref 6.5–8.1)

## 2022-05-17 LAB — CBC
HCT: 34.2 % — ABNORMAL LOW (ref 36.0–46.0)
Hemoglobin: 11.3 g/dL — ABNORMAL LOW (ref 12.0–15.0)
MCH: 31.5 pg (ref 26.0–34.0)
MCHC: 33 g/dL (ref 30.0–36.0)
MCV: 95.3 fL (ref 80.0–100.0)
Platelets: 203 10*3/uL (ref 150–400)
RBC: 3.59 MIL/uL — ABNORMAL LOW (ref 3.87–5.11)
RDW: 14.2 % (ref 11.5–15.5)
WBC: 7.1 10*3/uL (ref 4.0–10.5)
nRBC: 0 % (ref 0.0–0.2)

## 2022-05-17 MED ORDER — GABAPENTIN 300 MG PO CAPS
300.0000 mg | ORAL_CAPSULE | Freq: Every day | ORAL | Status: DC
  Administered 2022-05-17 – 2022-05-19 (×3): 300 mg via ORAL
  Filled 2022-05-17 (×4): qty 1

## 2022-05-17 MED ORDER — ACETAMINOPHEN 650 MG RE SUPP: 650.0000 mg | Freq: Four times a day (QID) | RECTAL | Status: DC | PRN

## 2022-05-17 MED ORDER — ACETAMINOPHEN 325 MG PO TABS
650.0000 mg | ORAL_TABLET | Freq: Four times a day (QID) | ORAL | Status: DC | PRN
  Administered 2022-05-19: 650 mg via ORAL
  Filled 2022-05-17: qty 2

## 2022-05-17 MED ORDER — EZETIMIBE 10 MG PO TABS
5.0000 mg | ORAL_TABLET | Freq: Every day | ORAL | Status: DC
  Administered 2022-05-17 – 2022-05-19 (×3): 5 mg via ORAL
  Filled 2022-05-17 (×4): qty 1

## 2022-05-17 MED ORDER — POLYETHYLENE GLYCOL 3350 17 G PO PACK: 17.0000 g | PACK | Freq: Every day | ORAL | Status: DC | PRN

## 2022-05-17 MED ORDER — IRBESARTAN 300 MG PO TABS: 300.0000 mg | ORAL_TABLET | Freq: Every day | ORAL | Status: DC

## 2022-05-17 MED ORDER — APIXABAN 5 MG PO TABS
5.0000 mg | ORAL_TABLET | Freq: Two times a day (BID) | ORAL | Status: DC
  Administered 2022-05-17 – 2022-05-20 (×6): 5 mg via ORAL
  Filled 2022-05-17 (×6): qty 1

## 2022-05-17 MED ORDER — SODIUM CHLORIDE 0.9% FLUSH
3.0000 mL | Freq: Two times a day (BID) | INTRAVENOUS | Status: DC
  Administered 2022-05-17 – 2022-05-20 (×5): 3 mL via INTRAVENOUS

## 2022-05-17 MED ORDER — METOPROLOL SUCCINATE ER 25 MG PO TB24
12.5000 mg | ORAL_TABLET | Freq: Every day | ORAL | Status: DC
  Administered 2022-05-18 – 2022-05-20 (×3): 12.5 mg via ORAL
  Filled 2022-05-17 (×3): qty 1

## 2022-05-17 MED ORDER — HYDROCHLOROTHIAZIDE 12.5 MG PO TABS: 12.5000 mg | ORAL_TABLET | Freq: Every day | ORAL | Status: DC

## 2022-05-17 MED ORDER — PRAVASTATIN SODIUM 40 MG PO TABS
20.0000 mg | ORAL_TABLET | Freq: Every day | ORAL | Status: DC
  Administered 2022-05-17 – 2022-05-19 (×3): 20 mg via ORAL
  Filled 2022-05-17 (×4): qty 1

## 2022-05-17 NOTE — H&P (Signed)
History and Physical   [name] CZY:[id number] DOB: Jun 01, 1941 DOA: 05/17/2022  PCP: [name], MD   Patient coming from: Home / [facility]  Chief Complaint: UTI  HPI: [name] is a 80 y.o. female with medical history significant of hypertension, hyperlipidemia, atrial fibrillation, CVA, diabetes, neuropathy, large cell lymphoma status post chemo, uterine prolapse with spastic bladder and recurrent urinary tract infection including carbapenem resistant Enterococcus and Klebsiella pneumonia presenting with UTI.  Patient sent from ID clinic.  She has had recurrent infections as above due to resistant organisms.  On recent cruise she was experiencing increase her urge incontinence and urinary frequency.  Has also been experiencing night sweats which have continued and has had increased fatigue and somnolence.  Family has reported mildly increased confusion as well.  Was evaluated in ID clinic today for the same.  Concern for developing drug-resistant UTI given her history.  She was excepted for direct admission.  Recommendation for urinalysis, urine cultures, blood cultures and initiation of Avycaz after these have been obtained.  Patient denies fevers, chills, chest pain, shortness breath, abdominal pain, constipation, diarrhea, Nausea, vomiting.   Course: Vital signs stable at ID clinic.  Not yet checked here.  Lab workup is still ongoing.  No imaging.  Review of Systems: As per HPI otherwise all other systems reviewed and are negative.  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   Cerebral infarction due to embolism of right middle cerebral artery ([place])    Diabetes mellitus    Diarrhea 11/30/2009   Qualifier: Diagnosis of   By: Tommy Medal MD, Cornelius       Excessive sleepiness 05/16/2022   Fatigue 05/16/2022    Fatty liver    Fibromyalgia    History of uterine prolapse 04/04/2022   Hyperlipidemia    Hypertension    Lymphoma malignant, large cell (HCC)    Neuropathy    Night sweat 05/16/2022   PAF (paroxysmal atrial fibrillation) ([place])    Slurred speech 02/16/2015   Stroke ([place])    Syncope 03/06/2018   Urge incontinence 04/04/2022   Urinary tract infection 09/19/2011   Being treated at this time Cipro    Past Surgical History:  Procedure Laterality Date   APPENDECTOMY     CATARACT EXTRACTION  2015   CHOLECYSTECTOMY     EYE SURGERY     JOINT REPLACEMENT     JOINT REPLACEMENT     LAMINECTOMY WITH POSTERIOR LATERAL ARTHRODESIS LEVEL 2 N/A 06/07/2019   Procedure: Laminectomy and Foraminotomy Lumbar three-Lumbar four  Lumbar four-Lumbar five with non-instrumented fusion;  Surgeon: Eustace Moore, MD;  Location: [facility];  Service: Neurosurgery;  Laterality: N/A;   ROTATOR CUFF REPAIR      Social History  reports that she has never smoked. She has never used smokeless tobacco. She reports current alcohol use of about 1.0 standard drink of alcohol per week. She reports that she does not use drugs.  No Known Allergies  Family History  Problem Relation Age of Onset   Heart Problems Brother    Cancer Sister   Reviewed on admission  Prior to Admission medications   Medication Sig Start Date End Date Taking? Authorizing Provider  Albuterol Sulfate (PROAIR RESPICLICK) 093 (90 Base) MCG/ACT AEPB     [provider]  allopurinol (ZYLOPRIM) 100  MG tablet Take 100 mg by mouth daily. 12/27/21   [provider]  apixaban (ELIQUIS) 5 MG TABS tablet Take 1 tablet (5 mg total) by mouth 2 (two) times daily. 02/18/15   Florencia Reasons, MD  Cholecalciferol (VITAMIN D PO) Take 1,000 Units by mouth daily.    [provider]  ezetimibe (ZETIA) 10 MG tablet Take 1 tablet (10 mg total) by mouth daily. Patient taking differently: Take 5 mg by mouth daily. For cholesterol 02/18/15   Florencia Reasons, MD  furosemide (LASIX) 20 MG tablet Take 1 tablet (20 mg total) by mouth every other day as needed for fluid or edema. 02/18/15   Florencia Reasons, MD  gabapentin (NEURONTIN) 300 MG capsule Take 1 capsule (300 mg total) by mouth 3 (three) times daily. Patient taking differently: Take 300 mg by mouth daily. 02/08/16   Melvenia Beam, MD  hydrochlorothiazide (HYDRODIURIL) 12.5 MG tablet Take 12.5 mg by mouth daily. 09/17/15   [provider]  metFORMIN (GLUCOPHAGE) 500 MG tablet Take 500 mg by mouth daily. 12/10/14   [provider]  methocarbamol (ROBAXIN) 500 MG tablet Take 1 tablet (500 mg total) by mouth every 6 (six) hours as needed for muscle spasms. 06/08/19   Earnie Larsson, MD  metoprolol succinate (TOPROL XL) 25 MG 24 hr tablet Take 0.5 tablets (12.5 mg total) by mouth daily. 02/20/15   Lelon Perla, MD  Multiple Vitamins-Minerals (CENTRUM SILVER PO) Take 1 tablet by mouth daily.    [provider]  olmesartan (BENICAR) 40 MG tablet Take 40 mg by mouth daily.    [provider]  Omega-3 Fatty Acids (FISH OIL PO) Take 1,200 mg by mouth daily.    [provider]  ondansetron (ZOFRAN) 4 MG tablet Take 1 tablet (4 mg total) by mouth every 6 (six) hours as needed for nausea or vomiting. 08/04/19   Sherol Dade E, PA-C  pravastatin (PRAVACHOL) 20 MG tablet Take 1 tablet (20 mg total) by mouth daily. 02/18/15   Florencia Reasons, MD  traMADol (ULTRAM) 50 MG tablet Take 1 tablet by mouth every 6 (six) hours as needed.    [provider]  vitamin B-12 (CYANOCOBALAMIN) 1000 MCG tablet Take 1,000 mcg by mouth daily.    [provider]    Physical Exam: Vitals:   05/17/22 1823  BP: 131/61  Pulse: 80  Resp: 16  Temp: 99.7 F (37.6 C)  TempSrc: Oral  SpO2: 100%    Physical Exam Constitutional:      General: She is not in acute distress.    Appearance: Normal appearance.  HENT:     Head: Normocephalic and atraumatic.      Mouth/Throat:     Mouth: Mucous membranes are moist.     Pharynx: Oropharynx is clear.  Eyes:     Extraocular Movements: Extraocular movements intact.     Pupils: Pupils are equal, round, and reactive to light.  Cardiovascular:     Rate and Rhythm: Normal rate and regular rhythm.     Pulses: Normal pulses.     Heart sounds: Normal heart sounds.  Pulmonary:     Effort: Pulmonary effort is normal. No respiratory distress.     Breath sounds: Normal breath sounds.  Abdominal:     General: Bowel sounds are normal. There is no distension.     Palpations: Abdomen is soft.     Tenderness: There is abdominal tenderness in the suprapubic area.  Musculoskeletal:  General: No swelling or deformity.  Skin:    General: Skin is warm and dry.  Neurological:     General: No focal deficit present.     Mental Status: Mental status is at baseline.    Labs on Admission: I have personally reviewed following labs and imaging studies  CBC: No results for input(s): "WBC", "NEUTROABS", "HGB", "HCT", "MCV", "PLT" in the last 168 hours.  Basic Metabolic Panel: No results for input(s): "NA", "K", "CL", "CO2", "GLUCOSE", "BUN", "CREATININE", "CALCIUM", "MG", "PHOS" in the last 168 hours.  GFR: CrCl cannot be calculated (Patient's most recent lab result is older than the maximum 21 days allowed.).  Liver Function Tests: No results for input(s): "AST", "ALT", "ALKPHOS", "BILITOT", "PROT", "ALBUMIN" in the last 168 hours.  Urine analysis:    Component Value Date/Time   COLORURINE YELLOW 04/04/2022 0217   APPEARANCEUR TURBID (A) 04/04/2022 0217   LABSPEC 1.012 04/04/2022 0217   PHURINE 6.0 04/04/2022 0217   GLUCOSEU NEGATIVE 04/04/2022 0217   GLUCOSEU NEG mg/dL 01/19/2010 1935   HGBUR 1+ (A) 04/04/2022 0217   BILIRUBINUR NEGATIVE 02/17/2015 0420   KETONESUR NEGATIVE 04/04/2022 0217   PROTEINUR TRACE (A) 04/04/2022 0217   UROBILINOGEN 0.2 02/17/2015 0420   NITRITE POSITIVE (A) 04/04/2022  0217   LEUKOCYTESUR 3+ (A) 04/04/2022 0217    Radiological Exams on Admission: No results found.  EKG: Not performed  Assessment/Plan Principal Problem:   UTI (urinary tract infection) Active Problems:   Hereditary and idiopathic peripheral neuropathy   History of CVA (cerebrovascular accident)   Diabetes ([place])   Dyslipidemia   PAF (paroxysmal atrial fibrillation) (HCC)   Essential hypertension   History of uterine prolapse   UTI > History of Carbapenem resistant Enterococcus infection and Klebsiella pneumonia infection.  In the setting of uterine prolapse with spastic bladder and recurrent UTIs. > Seen in ID clinic today concern for recent urinary frequency and urge incontinence with night sweats increased somnolence fatigue and some mild confusion. > Does have suprapubic tenderness on exam. > Directed admitted for lab workup and treatment with Avycaz. - Inpatient ID team consulted, appreciate assistance and recommendations - Urinalysis, urine culture, blood culture - Plan is for antibiotics following cultures being obtained, to be ordered by ID  Hypertension - Continue home metoprolol, olmesartan, hydrochlorothiazide, Lasix  Hyperlipidemia - Continue pravastatin  Paroxysmal atrial fibrillation - Continue home Eliquis and metoprolol  History of CVA - Continue home Zetia, pravastatin - Is on Eliquis as above  Diabetes - SSI  Neuropathy - Continue home gabapentin  History of lymphoma status post chemo - Noted  DVT prophylaxis: Eliquis Code Status:   Full Family Communication:  Updated at bedside. Disposition Plan:   Patient is from:  Home  Anticipated DC to:  Home  Anticipated DC date:  2 to 5 days  Anticipated DC barriers: None  Consults called:  Infectious disease Admission status:  Inpatient, telemetry  Severity of Illness: The appropriate patient status for this patient is INPATIENT. Inpatient status is judged to be reasonable and necessary in order  to provide the required intensity of service to ensure the patient's safety. The patient's presenting symptoms, physical exam findings, and initial radiographic and laboratory data in the context of their chronic comorbidities is felt to place them at high risk for further clinical deterioration. Furthermore, it is not anticipated that the patient will be medically stable for discharge from the hospital within 2 midnights of admission.   * I certify that at the point of  admission it is my clinical judgment that the patient will require inpatient hospital care spanning beyond 2 midnights from the point of admission due to high intensity of service, high risk for further deterioration and high frequency of surveillance required.Marcelyn Bruins MD [facility]  How to contact the [facility] Attending or Consulting provider [place] or covering provider during after hours [place], for this patient?   Check the care team in [facility] and look for a) attending/consulting [facility] provider listed and b) the [facility] team listed Log into [email or web address] and use [facility]'s universal password to access. If you do not have the password, please contact the hospital operator. Locate the [facility] provider you are looking for under [facility] and page to a number that you can be directly reached. If you still have difficulty reaching the provider, please page the [facility] (Director on Call) for the Hospitalists listed on amion for assistance.  05/17/2022, 6:47 PM

## 2022-05-17 NOTE — Telephone Encounter (Signed)
Received call from patient's husband wanting to know status of direct admission. Spoke with Dawn at bed control who reports it could be "a long while" before patient gets a bed.   Relayed this to [name] and reminded him that if patient's condition worsens he should take her to the emergency department, he verbalized understanding.   Beryle Flock, RN

## 2022-05-18 ENCOUNTER — Inpatient Hospital Stay (HOSPITAL_COMMUNITY): Payer: Medicare Other

## 2022-05-18 DIAGNOSIS — J989 Respiratory disorder, unspecified: Secondary | ICD-10-CM

## 2022-05-18 DIAGNOSIS — Z1613 Resistance to carbapenem: Secondary | ICD-10-CM

## 2022-05-18 DIAGNOSIS — N3000 Acute cystitis without hematuria: Secondary | ICD-10-CM

## 2022-05-18 LAB — COMPREHENSIVE METABOLIC PANEL
ALT: 22 U/L (ref 0–44)
AST: 27 U/L (ref 15–41)
Albumin: 3 g/dL — ABNORMAL LOW (ref 3.5–5.0)
Alkaline Phosphatase: 62 U/L (ref 38–126)
Anion gap: 8 (ref 5–15)
BUN: 25 mg/dL — ABNORMAL HIGH (ref 8–23)
CO2: 24 mmol/L (ref 22–32)
Calcium: 8.8 mg/dL — ABNORMAL LOW (ref 8.9–10.3)
Chloride: 102 mmol/L (ref 98–111)
Creatinine, Ser: 1.21 mg/dL — ABNORMAL HIGH (ref 0.44–1.00)
GFR, Estimated: 45 mL/min — ABNORMAL LOW (ref >60)
Glucose, Bld: 96 mg/dL (ref 70–99)
Potassium: 3.6 mmol/L (ref 3.5–5.1)
Sodium: 134 mmol/L — ABNORMAL LOW (ref 135–145)
Total Bilirubin: 1.1 mg/dL (ref 0.3–1.2)
Total Protein: 7 g/dL (ref 6.5–8.1)

## 2022-05-18 LAB — URINALYSIS, ROUTINE W REFLEX MICROSCOPIC
Bilirubin Urine: NEGATIVE
Glucose, UA: NEGATIVE mg/dL
Ketones, ur: NEGATIVE mg/dL
Nitrite: POSITIVE — AB
Protein, ur: 100 mg/dL — AB
Specific Gravity, Urine: 1.008 (ref 1.005–1.030)
WBC, UA: >50 WBC/hpf — ABNORMAL HIGH (ref 0–5)
pH: 5 (ref 5.0–8.0)

## 2022-05-18 LAB — CBC
HCT: 35.1 % — ABNORMAL LOW (ref 36.0–46.0)
Hemoglobin: 11.4 g/dL — ABNORMAL LOW (ref 12.0–15.0)
MCH: 31.1 pg (ref 26.0–34.0)
MCHC: 32.5 g/dL (ref 30.0–36.0)
MCV: 95.6 fL (ref 80.0–100.0)
Platelets: 220 10*3/uL (ref 150–400)
RBC: 3.67 MIL/uL — ABNORMAL LOW (ref 3.87–5.11)
RDW: 14.3 % (ref 11.5–15.5)
WBC: 7 10*3/uL (ref 4.0–10.5)
nRBC: 0 % (ref 0.0–0.2)

## 2022-05-18 LAB — RESPIRATORY PANEL BY PCR

## 2022-05-18 LAB — CBC WITH DIFFERENTIAL/PLATELET
Abs Immature Granulocytes: 0.03 10*3/uL (ref 0.00–0.07)
Basophils Absolute: 0 10*3/uL (ref 0.0–0.1)
Basophils Relative: 0 %
Eosinophils Absolute: 0.1 10*3/uL (ref 0.0–0.5)
Eosinophils Relative: 2 %
HCT: 34.7 % — ABNORMAL LOW (ref 36.0–46.0)
Hemoglobin: 11.4 g/dL — ABNORMAL LOW (ref 12.0–15.0)
Immature Granulocytes: 0 %
Lymphocytes Relative: 49 %
Lymphs Abs: 3.5 10*3/uL (ref 0.7–4.0)
MCH: 31.5 pg (ref 26.0–34.0)
MCHC: 32.9 g/dL (ref 30.0–36.0)
MCV: 95.9 fL (ref 80.0–100.0)
Monocytes Absolute: 0.5 10*3/uL (ref 0.1–1.0)
Monocytes Relative: 7 %
Neutro Abs: 3 10*3/uL (ref 1.7–7.7)
Neutrophils Relative %: 42 %
Platelets: 225 10*3/uL (ref 150–400)
RBC: 3.62 MIL/uL — ABNORMAL LOW (ref 3.87–5.11)
RDW: 14.3 % (ref 11.5–15.5)
WBC: 7.2 10*3/uL (ref 4.0–10.5)
nRBC: 0 % (ref 0.0–0.2)

## 2022-05-18 LAB — SARS CORONAVIRUS 2 BY RT PCR: SARS Coronavirus 2 by RT PCR: NEGATIVE

## 2022-05-18 MED ORDER — DEXTROSE 5 % IV SOLN
1.2500 g | Freq: Three times a day (TID) | INTRAVENOUS | Status: DC
  Administered 2022-05-18 – 2022-05-20 (×7): 1.25 g via INTRAVENOUS
  Filled 2022-05-18 (×9): qty 6

## 2022-05-18 MED ORDER — GUAIFENESIN 100 MG/5ML PO LIQD: 5.0000 mL | ORAL | Status: DC | PRN

## 2022-05-18 MED ORDER — HYDRALAZINE HCL 20 MG/ML IJ SOLN: 10.0000 mg | Freq: Four times a day (QID) | INTRAMUSCULAR | Status: DC | PRN

## 2022-05-18 MED ORDER — ALBUTEROL SULFATE (2.5 MG/3ML) 0.083% IN NEBU
2.5000 mg | INHALATION_SOLUTION | Freq: Four times a day (QID) | RESPIRATORY_TRACT | Status: DC
  Administered 2022-05-18 – 2022-05-19 (×4): 2.5 mg via RESPIRATORY_TRACT
  Filled 2022-05-18 (×4): qty 3

## 2022-05-18 MED ORDER — RINGERS IV SOLN: INTRAVENOUS | Status: DC

## 2022-05-18 NOTE — Plan of Care (Signed)
  Problem: Education: Goal: Knowledge of General Education information will improve Description: Including pain rating scale, medication(s)/side effects and non-pharmacologic comfort measures Outcome: Progressing   Problem: Clinical Measurements: Goal: Ability to maintain clinical measurements within normal limits will improve Outcome: Progressing Goal: Diagnostic test results will improve Outcome: Progressing   

## 2022-05-18 NOTE — Plan of Care (Signed)

## 2022-05-18 NOTE — Consult Note (Signed)
[facility] for Infectious Disease  Total days of antibiotics just for a week: 0 Day        Reason for Consult: MDR UTI    Referring Physician: Neva Seat, MD [facility]  Principal Problem:   UTI (urinary tract infection) Active Problems:   Hereditary and idiopathic peripheral neuropathy   History of CVA (cerebrovascular accident)   Diabetes ([place])   Dyslipidemia   PAF (paroxysmal atrial fibrillation) ([place])   Essential hypertension   History of uterine prolapse   HPI: [name] is a 81 y.o. 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 Patient reports she and her daughter went on a 2-week cruise from 11/6 to 11/20.  Since returning, she has had excessive fatigue and generalized malaise. She also reports a productive cough of clear sputum for the last 3-4 days. She has not been tested for covid and has been taking mucinex for the cough. She reports associated chills but denies any fever, shortness of breath, abdominal pain, hematuria, sore throat or dysuria. She does report frequent urination more than her usual and some pelvic pressure.   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   Cerebral infarction due to embolism of right middle cerebral artery ([place])    Diabetes mellitus    Diarrhea 11/30/2009   Qualifier: Diagnosis of   By: Tommy Medal MD, Cornelius       Excessive sleepiness 05/16/2022   Fatigue 05/16/2022   Fatty liver    Fibromyalgia    History of uterine prolapse 04/04/2022   Hyperlipidemia    Hypertension     Lymphoma malignant, large cell (HCC)    Neuropathy    Night sweat 05/16/2022   PAF (paroxysmal atrial fibrillation) ([place])    Slurred speech 02/16/2015   Stroke ([place])    Syncope 03/06/2018   Urge incontinence 04/04/2022   Urinary tract infection 09/19/2011   Being treated at this time Cipro    Allergies: No Known Allergies  Current antibiotics: None   MEDICATIONS:  apixaban  5 mg Oral BID   ezetimibe  5 mg Oral Daily   gabapentin  300 mg Oral Daily   metoprolol succinate  12.5 mg Oral Daily   pravastatin  20 mg Oral Daily   sodium chloride flush  3 mL Intravenous Q12H    Social History   Tobacco Use   Smoking status: Never   Smokeless tobacco: Never  Vaping Use   Vaping Use: Never used  Substance Use Topics   Alcohol use: Yes    Alcohol/week: 1.0 standard drink of alcohol    Types: 1 Glasses of wine per week    Comment: Rare   Drug use: No    Family History  Problem Relation Age of Onset   Heart Problems Brother    Cancer Sister     Review of Systems - Negative except as stated in HPI  OBJECTIVE: Temp:  [98.3 F (36.8 C)-99.7 F (37.6 C)] 98.3 F (36.8 C) (11/29 0846) Pulse Rate:  [69-81] 81 (11/29 0846) Resp:  [14-23] 23 (11/29 0846) BP: (131-151)/(61-91) 151/90 (11/29  0846) SpO2:  [97 %-100 %] 98 % (11/29 0846) Weight:  [69.3 kg] 69.3 kg (11/29 0523) Physical exam General: Pleasant, well-appearing elderly woman laying in bed. No acute distress. HEENT: Moist mucous membrane.  No lymphadenopathy. CV: RRR. No murmurs, rubs, or gallops. No LE edema Pulmonary: Lungs CTAB. Normal effort. No wheezing, rhonchi or rales. Abdominal: Soft, nontender, nondistended. Normal bowel sounds. Extremities: Palpable radial and DP pulses. Normal ROM. Skin: Warm and dry. No obvious rash or lesions. Neuro: A&Ox3. Moves all extremities. Normal sensation. No focal deficit. Psych: Normal mood and affect   LABS: Results for orders placed or performed during the hospital  encounter of 05/17/22 (from the past 48 hour(s))  Comprehensive metabolic panel     Status: Abnormal   Collection Time: 05/17/22  6:30 PM  Result Value Ref Range   Sodium 136 135 - 145 mmol/L   Potassium 3.8 3.5 - 5.1 mmol/L   Chloride 103 98 - 111 mmol/L   CO2 24 22 - 32 mmol/L   Glucose, Bld 107 (H) 70 - 99 mg/dL    Comment: Glucose reference range applies only to samples taken after fasting for at least 8 hours.   BUN 27 (H) 8 - 23 mg/dL   Creatinine, Ser 1.33 (H) 0.44 - 1.00 mg/dL   Calcium 9.0 8.9 - 10.3 mg/dL   Total Protein 7.0 6.5 - 8.1 g/dL   Albumin 3.1 (L) 3.5 - 5.0 g/dL   AST 26 15 - 41 U/L   ALT 22 0 - 44 U/L   Alkaline Phosphatase 67 38 - 126 U/L   Total Bilirubin 0.7 0.3 - 1.2 mg/dL   GFR, Estimated 40 (L) >60 mL/min    Comment: (NOTE) Calculated using the CKD-EPI Creatinine Equation (2021)    Anion gap 9 5 - 15    Comment: Performed at [facility] [address], [place], [place] [address]  CBC     Status: Abnormal   Collection Time: 05/17/22  6:30 PM  Result Value Ref Range   WBC 7.1 4.0 - 10.5 K/uL   RBC 3.59 (L) 3.87 - 5.11 MIL/uL   Hemoglobin 11.3 (L) 12.0 - 15.0 g/dL   HCT 34.2 (L) 36.0 - 46.0 %   MCV 95.3 80.0 - 100.0 fL   MCH 31.5 26.0 - 34.0 pg   MCHC 33.0 30.0 - 36.0 g/dL   RDW 14.2 11.5 - 15.5 %   Platelets 203 150 - 400 K/uL   nRBC 0.0 0.0 - 0.2 %    Comment: Performed at [facility], [place] [address] [place], [place] [address]  Culture, blood (Routine X 2) w Reflex to ID Panel     Status: None (Preliminary result)   Collection Time: 05/17/22  6:40 PM   Specimen: BLOOD LEFT ARM  Result Value Ref Range   Specimen Description BLOOD LEFT ARM    Special Requests      BOTTLES DRAWN AEROBIC AND ANAEROBIC Blood Culture adequate volume   Culture      NO GROWTH < 12 HOURS Performed at [facility], [place] [address] [place], [place] [address]    Report Status PENDING   Culture, blood (Routine X 2) w Reflex to ID Panel     Status:  None (Preliminary result)   Collection Time: 05/17/22  6:41 PM   Specimen: BLOOD RIGHT ARM  Result Value Ref Range   Specimen Description BLOOD RIGHT ARM    Special Requests  BOTTLES DRAWN AEROBIC AND ANAEROBIC Blood Culture adequate volume   Culture      NO GROWTH < 12 HOURS Performed at [facility] [address], [place], [place] [address]    Report Status PENDING   Comprehensive metabolic panel     Status: Abnormal   Collection Time: 05/18/22  4:51 AM  Result Value Ref Range   Sodium 134 (L) 135 - 145 mmol/L   Potassium 3.6 3.5 - 5.1 mmol/L   Chloride 102 98 - 111 mmol/L   CO2 24 22 - 32 mmol/L   Glucose, Bld 96 70 - 99 mg/dL    Comment: Glucose reference range applies only to samples taken after fasting for at least 8 hours.   BUN 25 (H) 8 - 23 mg/dL   Creatinine, Ser 1.21 (H) 0.44 - 1.00 mg/dL   Calcium 8.8 (L) 8.9 - 10.3 mg/dL   Total Protein 7.0 6.5 - 8.1 g/dL   Albumin 3.0 (L) 3.5 - 5.0 g/dL   AST 27 15 - 41 U/L   ALT 22 0 - 44 U/L   Alkaline Phosphatase 62 38 - 126 U/L   Total Bilirubin 1.1 0.3 - 1.2 mg/dL   GFR, Estimated 45 (L) >60 mL/min    Comment: (NOTE) Calculated using the CKD-EPI Creatinine Equation (2021)    Anion gap 8 5 - 15    Comment: Performed at [facility], [place] [address], [place], [place] [address]  CBC     Status: Abnormal   Collection Time: 05/18/22  4:51 AM  Result Value Ref Range   WBC 7.0 4.0 - 10.5 K/uL   RBC 3.67 (L) 3.87 - 5.11 MIL/uL   Hemoglobin 11.4 (L) 12.0 - 15.0 g/dL   HCT 35.1 (L) 36.0 - 46.0 %   MCV 95.6 80.0 - 100.0 fL   MCH 31.1 26.0 - 34.0 pg   MCHC 32.5 30.0 - 36.0 g/dL   RDW 14.3 11.5 - 15.5 %   Platelets 220 150 - 400 K/uL   nRBC 0.0 0.0 - 0.2 %    Comment: Performed at [facility], [place] [address], [place], [place] [address]    MICRO:  Blood culture 11/28 >> no growth <12 hr Urine culture 11/29 >> Pending  IMAGING: DG Chest Port 1 View  Result Date: 05/18/2022 CLINICAL DATA:  Cough  EXAM: PORTABLE CHEST 1 VIEW COMPARISON:  06/05/2019 FINDINGS: Heart size is normal. Calcification and tortuosity of the aorta. The lungs are clear. No infiltrate, collapse or effusion. No edema. No significant bone finding of an acute nature. Probable chronic degenerative change in rotator cuff pathology of the right shoulder. IMPRESSION: No active disease. Aortic atherosclerosis. Electronically Signed   By: Nelson Chimes M.D.   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  Recently came back from a cruise with progressively fatigue, productive cough and worsening pelvic pressure/urinary frequency   #MDR UTI Hx spastic bladder, and Carbapenem resistant Enterobacteriaceae (CRE), specifically Klebsiella pneumonia from last urine culture. UA today shows +nitrite, large leuks, >50 WBC, and many bacteria. She reports more pelvic pressure and worsening urinary frequency. -Start Avycaz and de-escalate based on urine culture -Follow-up urine culture -Follow-up blood culture  #  Cough Reports productive cough 5 days after returning from cruise.  Daughter has similar symptoms.  Has associated chills but denies any fevers, shortness of breath no sore throat. CXR negative for pneumonia. Concern for possible rviral illness. -Check COVID, flu -Airborne and contact precautions -Start prn guaifenesin for cough

## 2022-05-18 NOTE — Progress Notes (Signed)
PROGRESS NOTE    [name]  NFA:[id number] DOB: 1941/05/17 DOA: 05/17/2022 PCP: [name], MD   Brief Narrative: 81 year old with past medical history significant for hypertension, hyperlipidemia, A-fib, CVA, diabetes, neuropathy, large cell lymphoma status post chemo, uterine prolapse with spastic bladder and recurrent UTI, prior resistant carbapenems and Enterococcus presents to the ID clinic complaining of increased urge to urinate, increased urinary frequency, night sweat, fatigue and somnolence.  She was referred for admission for evaluation of UTI.  In and out cath show a UA with more than 50 white blood cells, positive nitrates.  Patient has been started on Avycaz.    Assessment & Plan:   Principal Problem:   UTI (urinary tract infection) Active Problems:   Hereditary and idiopathic peripheral neuropathy   History of CVA (cerebrovascular accident)   Diabetes ([place])   Dyslipidemia   PAF (paroxysmal atrial fibrillation) ([place])   Essential hypertension   History of uterine prolapse   1-UTI;  UA with more than 50 WBC.  Started on [employer].  ID following.  Urine culture pending.   2-Hypertension: Continue with metoprolol Hold olmesartan and hydrochlorothiazide due to concern for infection and dehydration Will order PRN hydralazine.   3-Hyperlipidemia: Continue with pravastatin  4-A-fib: Continue with Eliquis and metoprolol  History  of CVA:  Continue with Eliquis Zetia and pravastatin  Diabetes: Continue with a sliding scale insulin  Neuropathy: Continue with gabapentin  History of lymphoma, status post ischemia Cough; resume albuterol. Covid negative.    Estimated body mass index is 31.93 kg/m as calculated from the following:   Height as of this encounter: '4\' 10"'$  (1.473 m).   Weight as of this encounter: 69.3 kg.   DVT prophylaxis: Eliquis Code Status: Full code Family Communication: Care discussed with patient and son who was at bedside.   Disposition Plan:  Status is: Inpatient Remains inpatient appropriate because: management of UTI    Consultants:  ID  Procedures:    Antimicrobials:    Subjective: She is alert, feels weak tired.  She required in and out to obtain urine sample.   Objective: Vitals:   05/17/22 1823 05/17/22 1939 05/18/22 0523 05/18/22 0846  BP: 131/61 133/73 (!) 140/91 (!) 151/90  Pulse: 80 79 69 81  Resp: '16 16 14 '$ (!) 23  Temp: 99.7 F (37.6 C) 98.5 F (36.9 C) 98.5 F (36.9 C) 98.3 F (36.8 C)  TempSrc: Oral Oral Oral Oral  SpO2: 100% 97% 99% 98%  Weight:   69.3 kg   Height:   '4\' 10"'$  (1.473 m)     Intake/Output Summary (Last 24 hours) at 05/18/2022 1713 Last data filed at 05/17/2022 2156 Gross per 24 hour  Intake 3 ml  Output --  Net 3 ml   Filed Weights   05/18/22 0523  Weight: 69.3 kg    Examination:  General exam: Appears calm and comfortable  Respiratory system: Clear to auscultation. Respiratory effort normal. Cardiovascular system: S1 & S2 heard, RRR. No JVD, murmurs, rubs, gallops or clicks. No pedal edema. Gastrointestinal system: Abdomen is nondistended, soft and nontender. No organomegaly or masses felt. Normal bowel sounds heard. Central nervous system: Alert and oriented.  Extremities: Symmetric 5 x 5 power.   Data Reviewed: I have personally reviewed following labs and imaging studies  CBC: Recent Labs  Lab 05/17/22 1830 05/18/22 0450 05/18/22 0451  WBC 7.1 7.2 7.0  NEUTROABS  --  3.0  --   HGB 11.3* 11.4* 11.4*  HCT 34.2* 34.7* 35.1*  MCV 95.3 95.9 95.6  PLT 203 225 973   Basic Metabolic Panel: Recent Labs  Lab 05/17/22 1830 05/18/22 0451  NA 136 134*  K 3.8 3.6  CL 103 102  CO2 24 24  GLUCOSE 107* 96  BUN 27* 25*  CREATININE 1.33* 1.21*  CALCIUM 9.0 8.8*   GFR: Estimated Creatinine Clearance: 30.1 mL/min (A) (by C-G formula based on SCr of 1.21 mg/dL (H)). Liver Function Tests: Recent Labs  Lab 05/17/22 1830 05/18/22 0451   AST 26 27  ALT 22 22  ALKPHOS 67 62  BILITOT 0.7 1.1  PROT 7.0 7.0  ALBUMIN 3.1* 3.0*   No results for input(s): "LIPASE", "AMYLASE" in the last 168 hours. No results for input(s): "AMMONIA" in the last 168 hours. Coagulation Profile: No results for input(s): "INR", "PROTIME" in the last 168 hours. Cardiac Enzymes: No results for input(s): "CKTOTAL", "CKMB", "CKMBINDEX", "TROPONINI" in the last 168 hours. BNP (last 3 results) No results for input(s): "PROBNP" in the last 8760 hours. HbA1C: No results for input(s): "HGBA1C" in the last 72 hours. CBG: No results for input(s): "GLUCAP" in the last 168 hours. Lipid Profile: No results for input(s): "CHOL", "HDL", "LDLCALC", "TRIG", "CHOLHDL", "LDLDIRECT" in the last 72 hours. Thyroid Function Tests: No results for input(s): "TSH", "T4TOTAL", "FREET4", "T3FREE", "THYROIDAB" in the last 72 hours. Anemia Panel: No results for input(s): "VITAMINB12", "FOLATE", "FERRITIN", "TIBC", "IRON", "RETICCTPCT" in the last 72 hours. Sepsis Labs: No results for input(s): "PROCALCITON", "LATICACIDVEN" in the last 168 hours.  Recent Results (from the past 240 hour(s))  Culture, blood (Routine X 2) w Reflex to ID Panel     Status: None (Preliminary result)   Collection Time: 05/17/22  6:40 PM   Specimen: BLOOD LEFT ARM  Result Value Ref Range Status   Specimen Description BLOOD LEFT ARM  Final   Special Requests   Final    BOTTLES DRAWN AEROBIC AND ANAEROBIC Blood Culture adequate volume   Culture   Final    NO GROWTH < 12 HOURS Performed at [facility], [address], [place], [place] [address]    Report Status PENDING  Incomplete  Culture, blood (Routine X 2) w Reflex to ID Panel     Status: None (Preliminary result)   Collection Time: 05/17/22  6:41 PM   Specimen: BLOOD RIGHT ARM  Result Value Ref Range Status   Specimen Description BLOOD RIGHT ARM  Final   Special Requests   Final    BOTTLES DRAWN AEROBIC AND ANAEROBIC Blood  Culture adequate volume   Culture   Final    NO GROWTH < 12 HOURS Performed at [facility], [place] [address] [place], [place] [address]    Report Status PENDING  Incomplete  SARS Coronavirus 2 by RT PCR (hospital order, performed in [facility] hospital lab) *cepheid single result test* Urine, In & Out Cath     Status: None   Collection Time: 05/18/22 10:12 AM   Specimen: Urine, In & Out Cath; Nasal Swab  Result Value Ref Range Status   SARS Coronavirus 2 by RT PCR NEGATIVE NEGATIVE Final    Comment: (NOTE) SARS-CoV-2 target nucleic acids are NOT DETECTED.  The SARS-CoV-2 RNA is generally detectable in upper and lower respiratory specimens during the acute phase of infection. The lowest concentration of SARS-CoV-2 viral copies this assay can detect is 250 copies / mL. A negative result does not preclude SARS-CoV-2 infection and should not be used as the sole  basis for treatment or other patient management decisions.  A negative result may occur with improper specimen collection / handling, submission of specimen other than nasopharyngeal swab, presence of viral mutation(s) within the areas targeted by this assay, and inadequate number of viral copies (<250 copies / mL). A negative result must be combined with clinical observations, patient history, and epidemiological information.  Fact Sheet for Patients:   https://www.patel.info/  Fact Sheet for Healthcare Providers: https://hall.com/  This test is not yet approved or  cleared by the [place] FDA and has been authorized for detection and/or diagnosis of SARS-CoV-2 by FDA under an Emergency Use Authorization (EUA).  This EUA will remain in effect (meaning this test can be used) for the duration of the COVID-19 declaration under Section 564(b)(1) of the Act, 21 U.S.C. section 360bbb-3(b)(1), unless the authorization is terminated or revoked sooner.  Performed at [facility], [place] [address], [place], [place] [address]          Radiology Studies: DG Chest Port 1 View  Result Date: 05/18/2022 CLINICAL DATA:  Cough EXAM: PORTABLE CHEST 1 VIEW COMPARISON:  06/05/2019 FINDINGS: Heart size is normal. Calcification and tortuosity of the aorta. The lungs are clear. No infiltrate, collapse or effusion. No edema. No significant bone finding of an acute nature. Probable chronic degenerative change in rotator cuff pathology of the right shoulder. IMPRESSION: No active disease. Aortic atherosclerosis. Electronically Signed   By: Nelson Chimes M.D.   On: 05/18/2022 11:31        Scheduled Meds:  albuterol  2.5 mg Inhalation Q6H   apixaban  5 mg Oral BID   ezetimibe  5 mg Oral Daily   gabapentin  300 mg Oral Daily   metoprolol succinate  12.5 mg Oral Daily   pravastatin  20 mg Oral Daily   sodium chloride flush  3 mL Intravenous Q12H   Continuous Infusions:  ceftazidime-avibactam (AVYCAZ) 1.25 g in dextrose 5 % 50 mL IVPB     ringers       LOS: 1 day    Time spent: 35 minutes    Wei Newbrough A Khrystal Jeanmarie, MD [facility]   If 7PM-7AM, please contact night-coverage [email or web address]  05/18/2022, 5:13 PM

## 2022-05-19 ENCOUNTER — Inpatient Hospital Stay: Payer: Self-pay

## 2022-05-19 ENCOUNTER — Encounter (HOSPITAL_COMMUNITY): Payer: Self-pay | Admitting: Internal Medicine

## 2022-05-19 DIAGNOSIS — B348 Other viral infections of unspecified site: Secondary | ICD-10-CM

## 2022-05-19 LAB — BASIC METABOLIC PANEL
Anion gap: 9 (ref 5–15)
BUN: 20 mg/dL (ref 8–23)
CO2: 23 mmol/L (ref 22–32)
Calcium: 8.7 mg/dL — ABNORMAL LOW (ref 8.9–10.3)
Chloride: 106 mmol/L (ref 98–111)
Creatinine, Ser: 1.06 mg/dL — ABNORMAL HIGH (ref 0.44–1.00)
GFR, Estimated: 53 mL/min — ABNORMAL LOW (ref >60)
Glucose, Bld: 121 mg/dL — ABNORMAL HIGH (ref 70–99)
Potassium: 3.3 mmol/L — ABNORMAL LOW (ref 3.5–5.1)
Sodium: 138 mmol/L (ref 135–145)

## 2022-05-19 MED ORDER — ALBUTEROL SULFATE (2.5 MG/3ML) 0.083% IN NEBU
2.5000 mg | INHALATION_SOLUTION | Freq: Three times a day (TID) | RESPIRATORY_TRACT | Status: DC
  Administered 2022-05-19: 2.5 mg via RESPIRATORY_TRACT
  Filled 2022-05-19: qty 3

## 2022-05-19 MED ORDER — POTASSIUM CHLORIDE CRYS ER 20 MEQ PO TBCR
40.0000 meq | EXTENDED_RELEASE_TABLET | Freq: Once | ORAL | Status: AC
  Administered 2022-05-19: 40 meq via ORAL
  Filled 2022-05-19: qty 2

## 2022-05-19 NOTE — Progress Notes (Addendum)
Bladder scanned patient due to poor urine output. 405 mL of urine retained from bladder scan.  In/out cath patient. 700 mL of urine of urine collected.

## 2022-05-19 NOTE — Progress Notes (Signed)
At bedside for PICC placement. Pt states she wants to wait until the AM for PICC placement. Husband at bedside. Consent signed and is on the chart.

## 2022-05-19 NOTE — Evaluation (Signed)
Physical Therapy Evaluation Patient Details Name: IOANNA COLQUHOUN MRN: [id number] DOB: 06-Oct-1940 Today's Date: 05/19/2022  History of Present Illness  81 y.o. female presents to [facility] hospital on 05/17/2022 with recurrent UTI 2/2 resistant organisms. PMH includes HTN, HLD, afib, DMII, large cell lymphoma.  Clinical Impression  Pt presents to PT with deficits in endurance, power, gait, balance. Pt is able to ambulate for household and limited community distances with support of walker at this time. Pt self-reports feeling more stable with walker, deferring ambulation out of room without DME currently. Pt will benefit from aggressive mobilization in an effort to restore independence and reduce falls risk. PT recommends discharge home when medically ready, no PT or DME needs.       Recommendations for follow up therapy are one component of a multi-disciplinary discharge planning process, led by the attending physician.  Recommendations may be updated based on patient status, additional functional criteria and insurance authorization.  Follow Up Recommendations No PT follow up      Assistance Recommended at Discharge PRN  Patient can return home with the following  A little help with bathing/dressing/bathroom;Assistance with cooking/housework    Equipment Recommendations None recommended by PT  Recommendations for Other Services       Functional Status Assessment Patient has had a recent decline in their functional status and demonstrates the ability to make significant improvements in function in a reasonable and predictable amount of time.     Precautions / Restrictions Precautions Precautions: Fall Restrictions Weight Bearing Restrictions: No      Mobility  Bed Mobility               General bed mobility comments: received sitting in recliner    Transfers Overall transfer level: Needs assistance Equipment used: None Transfers: Sit to/from Stand Sit to Stand:  Supervision                Ambulation/Gait Ambulation/Gait assistance: Supervision Gait Distance (Feet): 250 Feet (20' without device) Assistive device: None, Rolling walker (2 wheels) Gait Pattern/deviations: Step-through pattern Gait velocity: functional Gait velocity interpretation: 1.31 - 2.62 ft/sec, indicative of limited community ambulator   General Gait Details: slowed step-through gait  Stairs            Wheelchair Mobility    Modified Rankin (Stroke Patients Only)       Balance Overall balance assessment: Needs assistance Sitting-balance support: No upper extremity supported, Feet supported Sitting balance-Leahy Scale: Good     Standing balance support: No upper extremity supported, During functional activity Standing balance-Leahy Scale: Fair                               Pertinent Vitals/Pain Pain Assessment Pain Assessment: No/denies pain    Home Living Family/patient expects to be discharged to:: Private residence Living Arrangements: Spouse/significant other Available Help at Discharge: Family (spouse, co-dependent. dtr also available PRN) Type of Home: House Home Access: Stairs to enter Entrance Stairs-Rails: None Entrance Stairs-Number of Steps: 1   Home Layout: One level Home Equipment: Cane - single Environmental consultant (2 wheels);BSC/3in1      Prior Function Prior Level of Function : Independent/Modified Independent;Driving;History of Falls (last six months) (4-5 falls in the last 3 months)             Mobility Comments: ambulates with RW at night, no device during the day       Hand Dominance   Dominant Hand:  Right    Extremity/Trunk Assessment   Upper Extremity Assessment Upper Extremity Assessment: Overall WFL for tasks assessed    Lower Extremity Assessment Lower Extremity Assessment: Generalized weakness    Cervical / Trunk Assessment Cervical / Trunk Assessment: Kyphotic   Communication   Communication: No difficulties  Cognition Arousal/Alertness: Awake/alert Behavior During Therapy: WFL for tasks assessed/performed Overall Cognitive Status: Within Functional Limits for tasks assessed                                          General Comments General comments (skin integrity, edema, etc.): VSS on RA    Exercises     Assessment/Plan    PT Assessment Patient needs continued PT services  PT Problem List Decreased strength;Decreased activity tolerance;Decreased balance;Decreased mobility       PT Treatment Interventions DME instruction;Gait training;Functional mobility training;Stair training;Balance training;Patient/family education    PT Goals (Current goals can be found in the Care Plan section)  Acute Rehab PT Goals Patient Stated Goal: to resolve infection, return to independence PT Goal Formulation: With patient Time For Goal Achievement: 06/02/22 Potential to Achieve Goals: Good    Frequency Min 3X/week     Co-evaluation               AM-PAC PT "6 Clicks" Mobility  Outcome Measure Help needed turning from your back to your side while in a flat bed without using bedrails?: A Little Help needed moving from lying on your back to sitting on the side of a flat bed without using bedrails?: A Little Help needed moving to and from a bed to a chair (including a wheelchair)?: A Little Help needed standing up from a chair using your arms (e.g., wheelchair or bedside chair)?: A Little Help needed to walk in hospital room?: A Little Help needed climbing 3-5 steps with a railing? : A Little 6 Click Score: 18    End of Session   Activity Tolerance: Patient tolerated treatment well Patient left: in chair;with call bell/phone within reach Nurse Communication: Mobility status PT Visit Diagnosis: Other abnormalities of gait and mobility (R26.89);Muscle weakness (generalized) (M62.81)    Time: 9702-6378 PT Time Calculation  (min) (ACUTE ONLY): 36 min   Charges:   PT Evaluation $PT Eval Low Complexity: [place], PT, DPT Acute Rehabilitation Office [phone]   Zenaida Niece 05/19/2022, 4:56 PM

## 2022-05-19 NOTE — Progress Notes (Addendum)
PROGRESS NOTE    [name]  QVZ:[id number] DOB: Sep 19, 1940 DOA: 05/17/2022 PCP: [name], MD   Brief Narrative: 81 year old with past medical history significant for hypertension, hyperlipidemia, A-fib, CVA, diabetes, neuropathy, large cell lymphoma status post chemo, uterine prolapse with spastic bladder and recurrent UTI, prior resistant carbapenems and Enterococcus presents to the ID clinic complaining of increased urge to urinate, increased urinary frequency, night sweat, fatigue and somnolence.  She was referred for admission for evaluation of UTI.  In and out cath show a UA with more than 50 white blood cells, positive nitrates.  Patient has been started on Avycaz.    Assessment & Plan:   Principal Problem:   UTI (urinary tract infection) Active Problems:   Hereditary and idiopathic peripheral neuropathy   History of CVA (cerebrovascular accident)   Diabetes ([place])   Dyslipidemia   PAF (paroxysmal atrial fibrillation) ([place])   Essential hypertension   History of uterine prolapse   1-UTI;  UA with more than 50 WBC.  Started on [employer]net.  ID following.  Urine culture: Klebsiella Pneumonia. Awaiting sensitivity   2-Hypertension: Continue with metoprolol Hold olmesartan and hydrochlorothiazide due to concern for infection and dehydration PRN hydralazine.   3-Hyperlipidemia: Continue with pravastatin  4-A-fib: Continue with Eliquis and metoprolol  5-History of CVA: Continue with Eliquis Zet[place] and pravastatin  6-Diabetes: Continue with a sliding scale insulin  7-Neuropathy: Continue with gabapentin.   History of lymphoma:  Status post ischemia.  Cough; Para-Influenza positive Resume albuterol. Covid negative.  Hypokalemia; replete orally.  Obesity; needs life style modification   Estimated body mass index is 31.93 kg/m as calculated from the following:   Height as of this encounter: '4\' 10"'$  (1.473 m).   Weight as of this encounter: 69.3 kg.   DVT  prophylaxis: Eliquis Code Status: Full code Family Communication: Care discussed with patient and son who was at bedside.  Disposition Plan:  Status is: Inpatient Remains inpatient appropriate because: management of UTI    Consultants:  ID  Procedures:    Antimicrobials:    Subjective: She is feeling better.  Denies pain.   Objective: Vitals:   05/18/22 2031 05/19/22 0541 05/19/22 0711 05/19/22 1451  BP: 139/77 123/75 128/74   Pulse: 80  88 73  Resp: 18 (!) 22 20   Temp: 98.2 F (36.8 C)  98 F (36.7 C) 98.1 F (36.7 C)  TempSrc: Oral  Oral Oral  SpO2: 99%  98% 99%  Weight:      Height:        Intake/Output Summary (Last 24 hours) at 05/19/2022 1638 Last data filed at 05/19/2022 0400 Gross per 24 hour  Intake 832.02 ml  Output --  Net 832.02 ml    Filed Weights   05/18/22 0523  Weight: 69.3 kg    Examination:  General exam: NAD Respiratory system: CTA Cardiovascular system: S 1, S 2 RRR Gastrointestinal system: BS present, soft, nt Central nervous system: Alert, follow command.  Extremities: No edema.    Data Reviewed: I have personally reviewed following labs and imaging studies  CBC: Recent Labs  Lab 05/17/22 1830 05/18/22 0450 05/18/22 0451  WBC 7.1 7.2 7.0  NEUTROABS  --  3.0  --   HGB 11.3* 11.4* 11.4*  HCT 34.2* 34.7* 35.1*  MCV 95.3 95.9 95.6  PLT 203 225 329    Basic Metabolic Panel: Recent Labs  Lab 05/17/22 1830 05/18/22 0451 05/19/22 0545  NA 136 134* 138  K 3.8 3.6 3.3*  CL 103 102 106  CO2 '24 24 23  '$ GLUCOSE 107* 96 121*  BUN 27* 25* 20  CREATININE 1.33* 1.21* 1.06*  CALCIUM 9.0 8.8* 8.7*    GFR: Estimated Creatinine Clearance: 34.4 mL/min (A) (by C-G formula based on SCr of 1.06 mg/dL (H)). Liver Function Tests: Recent Labs  Lab 05/17/22 1830 05/18/22 0451  AST 26 27  ALT 22 22  ALKPHOS 67 62  BILITOT 0.7 1.1  PROT 7.0 7.0  ALBUMIN 3.1* 3.0*    No results for input(s): "LIPASE", "AMYLASE" in the  last 168 hours. No results for input(s): "AMMONIA" in the last 168 hours. Coagulation Profile: No results for input(s): "INR", "PROTIME" in the last 168 hours. Cardiac Enzymes: No results for input(s): "CKTOTAL", "CKMB", "CKMBINDEX", "TROPONINI" in the last 168 hours. BNP (last 3 results) No results for input(s): "PROBNP" in the last 8760 hours. HbA1C: No results for input(s): "HGBA1C" in the last 72 hours. CBG: No results for input(s): "GLUCAP" in the last 168 hours. Lipid Profile: No results for input(s): "CHOL", "HDL", "LDLCALC", "TRIG", "CHOLHDL", "LDLDIRECT" in the last 72 hours. Thyroid Function Tests: No results for input(s): "TSH", "T4TOTAL", "FREET4", "T3FREE", "THYROIDAB" in the last 72 hours. Anemia Panel: No results for input(s): "VITAMINB12", "FOLATE", "FERRITIN", "TIBC", "IRON", "RETICCTPCT" in the last 72 hours. Sepsis Labs: No results for input(s): "PROCALCITON", "LATICACIDVEN" in the last 168 hours.  Recent Results (from the past 240 hour(s))  Urine Culture     Status: Abnormal (Preliminary result)   Collection Time: 05/17/22  6:00 PM   Specimen: Urine, Clean Catch  Result Value Ref Range Status   Specimen Description URINE, CLEAN CATCH  Final   Special Requests NONE  Final   Culture (A)  Final    >=100,000 COLONIES/mL KLEBSIELLA PNEUMONIAE SUSCEPTIBILITIES TO FOLLOW Performed at [facility], [address], [place], [place] [address]    Report Status PENDING  Incomplete  Culture, blood (Routine X 2) w Reflex to ID Panel     Status: None (Preliminary result)   Collection Time: 05/17/22  6:40 PM   Specimen: BLOOD LEFT ARM  Result Value Ref Range Status   Specimen Description BLOOD LEFT ARM  Final   Special Requests   Final    BOTTLES DRAWN AEROBIC AND ANAEROBIC Blood Culture adequate volume   Culture   Final    NO GROWTH 2 DAYS Performed at [facility], [place] [address], [place], [place] [address]    Report Status PENDING  Incomplete  Culture,  blood (Routine X 2) w Reflex to ID Panel     Status: None (Preliminary result)   Collection Time: 05/17/22  6:41 PM   Specimen: BLOOD RIGHT ARM  Result Value Ref Range Status   Specimen Description BLOOD RIGHT ARM  Final   Special Requests   Final    BOTTLES DRAWN AEROBIC AND ANAEROBIC Blood Culture adequate volume   Culture   Final    NO GROWTH 2 DAYS Performed at [facility], [place] [address], [place],  [address]    Report Status PENDING  Incomplete  SARS Coronavirus 2 by RT PCR (hospital order, performed in [facility] hospital lab) *cepheid single result test* Urine, In & Out Cath     Status: None   Collection Time: 05/18/22 10:12 AM   Specimen: Urine, In & Out Cath; Nasal Swab  Result Value Ref Range Status   SARS Coronavirus 2 by RT PCR NEGATIVE NEGATIVE Final    Comment: (  NOTE) SARS-CoV-2 target nucleic acids are NOT DETECTED.  The SARS-CoV-2 RNA is generally detectable in upper and lower respiratory specimens during the acute phase of infection. The lowest concentration of SARS-CoV-2 viral copies this assay can detect is 250 copies / mL. A negative result does not preclude SARS-CoV-2 infection and should not be used as the sole basis for treatment or other patient management decisions.  A negative result may occur with improper specimen collection / handling, submission of specimen other than nasopharyngeal swab, presence of viral mutation(s) within the areas targeted by this assay, and inadequate number of viral copies (<250 copies / mL). A negative result must be combined with clinical observations, patient history, and epidemiological information.  Fact Sheet for Patients:   https://www.patel.info/  Fact Sheet for Healthcare Providers: https://hall.com/  This test is not yet approved or  cleared by the [place] FDA and has been authorized for detection and/or diagnosis of SARS-CoV-2 by FDA under an Emergency  Use Authorization (EUA).  This EUA will remain in effect (meaning this test can be used) for the duration of the COVID-19 declaration under Section 564(b)(1) of the Act, 21 U.S.C. section 360bbb-3(b)(1), unless the authorization is terminated or revoked sooner.  Performed at [facility], [place] [address] [place], [place] [address]   Respiratory (~20 pathogens) panel by PCR     Status: Abnormal   Collection Time: 05/18/22  3:00 PM   Specimen: Nasopharyngeal Swab; Respiratory  Result Value Ref Range Status   Adenovirus NOT DETECTED NOT DETECTED Final   Coronavirus 229E NOT DETECTED NOT DETECTED Final    Comment: (NOTE) The Coronavirus on the Respiratory Panel, DOES NOT test for the novel  Coronavirus (2019 nCoV)    Coronavirus HKU1 NOT DETECTED NOT DETECTED Final   Coronavirus NL63 NOT DETECTED NOT DETECTED Final   Coronavirus OC43 NOT DETECTED NOT DETECTED Final   Metapneumovirus NOT DETECTED NOT DETECTED Final   Rhinovirus / Enterovirus NOT DETECTED NOT DETECTED Final   Influenza A NOT DETECTED NOT DETECTED Final   Influenza B NOT DETECTED NOT DETECTED Final   Parainfluenza Virus 1 DETECTED (A) NOT DETECTED Final   Parainfluenza Virus 2 NOT DETECTED NOT DETECTED Final   Parainfluenza Virus 3 NOT DETECTED NOT DETECTED Final   Parainfluenza Virus 4 NOT DETECTED NOT DETECTED Final   Respiratory Syncytial Virus NOT DETECTED NOT DETECTED Final   Bordetella pertussis NOT DETECTED NOT DETECTED Final   Bordetella Parapertussis NOT DETECTED NOT DETECTED Final   Chlamydophila pneumoniae NOT DETECTED NOT DETECTED Final   Mycoplasma pneumoniae NOT DETECTED NOT DETECTED Final    Comment: Performed at [facility], [place]. [address], [place], [place] [address]         Radiology Studies: [place] EKG SITE RITE  Result Date: 05/19/2022 If Site Rite image not attached, placement could not be confirmed due to current cardiac rhythm.  DG Chest Port 1 View  Result Date:  05/18/2022 CLINICAL DATA:  Cough EXAM: PORTABLE CHEST 1 VIEW COMPARISON:  06/05/2019 FINDINGS: Heart size is normal. Calcification and tortuosity of the aorta. The lungs are clear. No infiltrate, collapse or effusion. No edema. No significant bone finding of an acute nature. Probable chronic degenerative change in rotator cuff pathology of the right shoulder. IMPRESSION: No active disease. Aortic atherosclerosis. Electronically Signed   By: Nelson Chimes M.D.   On: 05/18/2022 11:31        Scheduled Meds:  albuterol  2.5 mg Inhalation TID   apixaban  5 mg Oral BID   ezetimibe  5 mg Oral Daily   gabapentin  300 mg Oral Daily   metoprolol succinate  12.5 mg Oral Daily   potassium chloride  40 mEq Oral Once   pravastatin  20 mg Oral Daily   sodium chloride flush  3 mL Intravenous Q12H   Continuous Infusions:  ceftazidime-avibactam (AVYCAZ) 1.25 g in dextrose 5 % 50 mL IVPB 1.25 g (05/19/22 1423)     LOS: 2 days    Time spent: 35 minutes    Joby Hershkowitz A Marquavion Venhuizen, MD [facility]   If 7PM-7AM, please contact night-coverage [email or web address]  05/19/2022, 4:38 PM

## 2022-05-19 NOTE — Progress Notes (Addendum)
[name] for Infectious Disease    Date of Admission:  05/17/2022   Total days of antibiotics 1         ID: [name] is a 81 y.o. 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  Reports being cold natured at baseline but states her cough has improved with albuterol nebulizer treatments. Pelvic pressure has improved but she continues to have urinary retention  Medications:   albuterol  2.5 mg Inhalation Q6H   apixaban  5 mg Oral BID   ezetimibe  5 mg Oral Daily   gabapentin  300 mg Oral Daily   metoprolol succinate  12.5 mg Oral Daily   pravastatin  20 mg Oral Daily   sodium chloride flush  3 mL Intravenous Q12H    Objective: Vital signs in last 24 hours: Temp:  [98 F (36.7 C)-98.2 F (36.8 C)] 98 F (36.7 C) (11/30 0711) Pulse Rate:  [80-88] 88 (11/30 0711) Resp:  [18-22] 20 (11/30 0711) BP: (123-139)/(74-77) 128/74 (11/30 0711) SpO2:  [98 %-99 %] 98 % (11/30 0711)   Physical exam General: Pleasant, well-appearing elderly woman laying in bed. No acute distress. HEENT: Moist mucous membrane. No lymphadenopathy. CV: RRR. No murmurs, rubs, or gallops. No LE edema Pulmonary: Lungs CTAB. Normal effort. No wheezing, rhonchi or rales. Abdominal: Soft, nontender, nondistended. Normal bowel sounds. Extremities: Palpable radial and DP pulses. Normal ROM. Skin: Warm and  dry. No obvious rash or lesions. Neuro: A&Ox3. Moves all extremities. Normal sensation. No focal deficit. Psych: Normal mood and affect  Lab Results Recent Labs    05/18/22 0450 05/18/22 0451 05/19/22 0545  WBC 7.2 7.0  --   HGB 11.4* 11.4*  --   HCT 34.7* 35.1*  --   NA  --  134* 138  K  --  3.6 3.3*  CL  --  102 106  CO2  --  24 23  BUN  --  25* 20  CREATININE  --  1.21* 1.06*   Liver Panel Recent Labs    05/17/22 1830 05/18/22 0451  PROT 7.0 7.0  ALBUMIN 3.1* 3.0*  AST 26 27  ALT 22 22  ALKPHOS 67 62  BILITOT 0.7 1.1   Sedimentation Rate No results for input(s): "ESRSEDRATE" in the last 72 hours. C-Reactive Protein No results for input(s): "CRP" in the last 72 hours.  Microbiology: Blood culture 11/28 >> no growth in 48 hr Urine culture 11/29 >> Pending  Studies/Results: DG Chest Port 1 View  Result Date: 05/18/2022 CLINICAL DATA:  Cough EXAM: PORTABLE CHEST 1 VIEW COMPARISON:  06/05/2019 FINDINGS: Heart size is normal. Calcification and tortuosity of the aorta. The lungs are clear. No infiltrate, collapse or effusion. No edema. No significant bone finding of an acute nature. Probable chronic degenerative change in rotator cuff pathology of the right shoulder. IMPRESSION: No active disease. Aortic atherosclerosis. Electronically Signed   By: Jan Fireman.D.  On: 05/18/2022 11:31     Assessment/Plan: Assessment/Plan:  PMH of HTN, HLD, A-fib, CVA, T2DM, neuropathy, large cell lymphoma status post chemo, uterine prolapse status post spastic bladder and recurrent UTI including Carbapenem resistant enterococci and Klebsiella pneumonia presenting from ID clinic for evaluation of generalized malaise with concern for possible UTI. Findings consistent with UTI and parainfluenza virus. Bcx still no growth after 2 days.  Pelvic pressure improved continues to have urinary retention.   #MDR UTI -Continue prn bladder scans and In & Out cath -F/u urine culture -Continue  Avycaz and de-escalate based on urine culture -Will need PICC line for prolonged antibiotics if urine culture positive for Klebsiella pneumonia -Contact precautions -Close follow-up with urology in the outpatient   #Parainfluenza virus CXR negative for pneumonia. Remains on room air. Cough is better -Continue droplet precautions -Continue prn guaifenesin for cough -Continue prn albuterol nebs  [facility] for Infectious Diseases Pager: [phone]  05/19/2022, 11:42 AM ------------------ Patient was seen, examined,treatment plan was discussed with dr. Coy Saunas.  I have personally reviewed the clinical findings, labs, imaging studies and management of this patient in detail.  I agree with the documentation, as recorded by dr. Coy Saunas.

## 2022-05-19 NOTE — TOC Initial Note (Incomplete)
Transition of Care [facility]) - Initial/Assessment Note    Patient Details  Name: [name] MRN: [id number] Date of Birth: 05-16-1941  [facility] CM/SW Contact:    Sharin Mons, RN Phone Number: 05/19/2022, 7:53 PM  Clinical Narrative:                   Expected Discharge Plan: Home/Self Care (vs home health) Barriers to Discharge: Continued Medical Work up   Patient Goals and CMS Choice     Choice offered to / list presented to : Patient  Expected Discharge Plan and Services Expected Discharge Plan: Home/Self Care (vs home health)   Discharge Planning Services: CM Consult   Living arrangements for the past 2 months: Single Family Home                 DME Arranged: Other see comment (? IV ABX therapy)                    Prior Living Arrangements/Services Living arrangements for the past 2 months: [address] with:: Spouse Patient language and need for interpreter reviewed:: Yes Do you feel safe going back to the place where you live?: Yes      Need for Family Participation in Patient Care: Yes (Comment) Care giver support system in place?: Yes (comment)   Criminal Activity/Legal Involvement Pertinent to Current Situation/Hospitalization: No - Comment as needed  Activities of Daily Living Home Assistive Devices/Equipment: Dentures (specify type), Eyeglasses, Walker (specify type) ADL Screening (condition at time of admission) Patient's cognitive ability adequate to safely complete daily activities?: Yes Is the patient deaf or have difficulty hearing?: No Does the patient have difficulty seeing, even when wearing glasses/contacts?: No Does the patient have difficulty concentrating, remembering, or making decisions?: No Patient able to express need for assistance with ADLs?: Yes Does the patient have difficulty dressing or bathing?: No Independently performs ADLs?: Yes (appropriate for developmental age) Does the patient  have difficulty walking or climbing stairs?: Yes Weakness of Legs: Both Weakness of Arms/Hands: Both  Permission Sought/Granted   Permission granted to share information with : Yes, Verbal Permission Granted  Share Information with NAME: [name]  Spouse  [phone]           Emotional Assessment Appearance:: Appears stated age Attitude/Demeanor/Rapport: Engaged, Gracious Affect (typically observed): Accepting Orientation: : Oriented to Self, Oriented to Place, Oriented to  Time, Oriented to Situation Alcohol / Substance Use: Not Applicable Psych Involvement: No (comment)  Admission diagnosis:  UTI (urinary tract infection) [N39.0] Patient Active Problem List   Diagnosis Date Noted   UTI (urinary tract infection) 05/17/2022   Excessive sleepiness 05/16/2022   Night sweat 05/16/2022   Fatigue 05/16/2022   Urge incontinence 04/04/2022   History of uterine prolapse 04/04/2022   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   S/P lumbar laminectomy 06/07/2019   Abnormal EKG 03/06/2018   Preop cardiovascular exam 03/06/2018   Chronic anticoagulation 04/21/2015   PAF (paroxysmal atrial fibrillation) ([place]) 02/20/2015   Essential hypertension 02/20/2015   Dyslipidemia    History of CVA (cerebrovascular accident) 02/16/2015   Diabetes ([place]) 02/16/2015   Atrial flutter ([place]) 02/16/2015   Hereditary and idiopathic peripheral neuropathy 12/16/2014   Non Hodgkin's lymphoma ([place]) 11/30/2009   UNSPECIFIED DISORDER OF KIDNEY AND URETER 11/30/2009   PCP:  [name], MD Pharmacy:   [place], [place]. [place]. [place] [address]  Phone: [phone] Fax: [phone]  [facility] [place], [place] [address] [place] [address] Phone: [phone] Fax: [phone]     Social Determinants of Health (SDOH) Interventions    Readmission Risk  Interventions     No data to display

## 2022-05-20 ENCOUNTER — Inpatient Hospital Stay (HOSPITAL_COMMUNITY): Payer: Medicare Other

## 2022-05-20 DIAGNOSIS — B348 Other viral infections of unspecified site: Secondary | ICD-10-CM

## 2022-05-20 DIAGNOSIS — J989 Respiratory disorder, unspecified: Secondary | ICD-10-CM

## 2022-05-20 DIAGNOSIS — Z1613 Resistance to carbapenem: Secondary | ICD-10-CM

## 2022-05-20 DIAGNOSIS — B9689 Other specified bacterial agents as the cause of diseases classified elsewhere: Secondary | ICD-10-CM

## 2022-05-20 LAB — BASIC METABOLIC PANEL
Anion gap: 6 (ref 5–15)
BUN: 17 mg/dL (ref 8–23)
CO2: 26 mmol/L (ref 22–32)
Calcium: 8.6 mg/dL — ABNORMAL LOW (ref 8.9–10.3)
Chloride: 108 mmol/L (ref 98–111)
Creatinine, Ser: 0.91 mg/dL (ref 0.44–1.00)
GFR, Estimated: >60 mL/min (ref >60)
Glucose, Bld: 136 mg/dL — ABNORMAL HIGH (ref 70–99)
Potassium: 4.6 mmol/L (ref 3.5–5.1)
Sodium: 140 mmol/L (ref 135–145)

## 2022-05-20 MED ORDER — CHLORHEXIDINE GLUCONATE CLOTH 2 % EX PADS
6.0000 | MEDICATED_PAD | Freq: Every day | CUTANEOUS | Status: DC
  Administered 2022-05-20: 6 via TOPICAL

## 2022-05-20 MED ORDER — GUAIFENESIN 100 MG/5ML PO LIQD: 5.0000 mL | ORAL | 0 refills | Status: DC | PRN

## 2022-05-20 MED ORDER — ENSURE ENLIVE PO LIQD
237.0000 mL | Freq: Two times a day (BID) | ORAL | Status: DC
  Administered 2022-05-20: 237 mL via ORAL

## 2022-05-20 MED ORDER — ALBUTEROL SULFATE (2.5 MG/3ML) 0.083% IN NEBU: 2.5000 mg | INHALATION_SOLUTION | Freq: Four times a day (QID) | RESPIRATORY_TRACT | Status: DC | PRN

## 2022-05-20 MED ORDER — CEFTAZIDIME-AVIBACTAM 2.5 (2-0.5) G IV SOLR: 1.2500 g | Freq: Three times a day (TID) | INTRAVENOUS | 0 refills | Status: AC

## 2022-05-20 MED ORDER — ENSURE ENLIVE PO LIQD: 237.0000 mL | Freq: Two times a day (BID) | ORAL | Status: DC

## 2022-05-20 MED ORDER — PROAIR RESPICLICK 108 (90 BASE) MCG/ACT IN AEPB: 1.0000 | INHALATION_SPRAY | Freq: Four times a day (QID) | RESPIRATORY_TRACT | 1 refills | Status: AC | PRN

## 2022-05-20 MED ORDER — SODIUM CHLORIDE 0.9% FLUSH: 10.0000 mL | INTRAVENOUS | Status: DC | PRN

## 2022-05-20 MED ORDER — HEPARIN SOD (PORK) LOCK FLUSH 100 UNIT/ML IV SOLN
250.0000 [IU] | INTRAVENOUS | Status: AC | PRN
  Administered 2022-05-20: 250 [IU]
  Filled 2022-05-20: qty 3

## 2022-05-20 MED ORDER — SODIUM CHLORIDE 0.9% FLUSH
10.0000 mL | Freq: Two times a day (BID) | INTRAVENOUS | Status: DC
  Administered 2022-05-20: 40 mL

## 2022-05-20 NOTE — Care Management Important Message (Signed)
Important Message  Patient Details  Name: [name] MRN: [id number] Date of Birth: 08/05/1940   Medicare Important Message Given:  Other (see comment)     Hannah Beat 05/20/2022, 2:45 PM

## 2022-05-20 NOTE — Progress Notes (Signed)
PHARMACY CONSULT NOTE FOR:  OUTPATIENT  PARENTERAL ANTIBIOTIC THERAPY (OPAT)  Indication: CRE UTI Regimen: Ceftazidime-avibactam 1.25 g every 8 hours  End date: 05/25/2022  IV antibiotic discharge orders are pended. To discharging provider:  please sign these orders via discharge navigator,  Select New Orders & click on the button choice - Manage This Unsigned Work.     Thank you for allowing pharmacy to be a part of this patient's care.  Jimmy Footman, PharmD, BCPS, [place] [facility] Clinical Pharmacist Phone: [phone] 05/20/2022, 11:20 AM

## 2022-05-20 NOTE — TOC Transition Note (Signed)
Transition of Care (TOC) - CM/SW Discharge Note Marvetta Gibbons RN, BSN Transitions of Care Unit 4E- RN Case Manager See Treatment Team for direct phone # [phone] Cross Coverage  Patient Details  Name: [name] MRN: [id number] Date of Birth: 13-Feb-1941  [facility]) CM/SW Contact:  Dawayne Patricia, RN Phone Number: 05/20/2022, 3:36 PM   Clinical Narrative:    Pt stable for transition home today with IV abx.  CM spoke w/ Pam at [facility] [place] to come speak with pt and family to confirm home IV abx plan- ID to give final OPAT order once Pam speaks to pt/family.   HHRN has been set up with [place] coordinating with Caryl Pina from [place] for home abx needs.   Pt will get dose here this afternoon, per Pam she will plan to be here later this afternoon between 4-4:30 to provide bedside education with pt/spouse.   Pt will d/c later this evening after education completed.   No further TOC needs noted, family will transport home.    Final next level of care: [place] Barriers to Discharge: Barriers Resolved   Patient Goals and CMS Choice Patient states their goals for this hospitalization and ongoing recovery are:: return home CMS Medicare.gov Compare Post Acute Care list provided to:: Patient Choice offered to / list presented to : Patient  Discharge Placement               Home w/ [facility]        Discharge Plan and Services   Discharge Planning Services: CM Consult Post Acute Care Choice: Home Health          DME Arranged: N/A DME Agency: NA       HH Arranged: RN, IV Antibiotics HH Agency: [facility] Date HH Agency Contacted: 05/20/22 Time HH Agency Contacted: 1400 Representative spoke with at [place]: [place] Determinants of Health ([place]) Interventions     Readmission Risk Interventions    05/20/2022    3:36 PM  Readmission Risk Prevention Plan  Post Dischage Appt Complete  Medication Screening  Complete  Transportation Screening Complete

## 2022-05-20 NOTE — Progress Notes (Signed)
12/01 I spoke with patient's husband Viha Kriegel) to let him know that I would mailing an IMM Letter to the address listed in the chart. I explained that it woruld be for his and his wife's review. Patient under droplet Contact Precaution.

## 2022-05-20 NOTE — Progress Notes (Signed)
Pharmacist needs to educate patient's husband about new medication. He will not arrive until 4:30pm.  Gomez Cleverly SWOT RN

## 2022-05-20 NOTE — Progress Notes (Addendum)
[facility]    Date of Admission:  05/17/2022   Total days of antibiotics 1         ID: [name] is a 81 y.o. female with    PMH of HTN, HLD, A-fib, CVA, T2DM, neuropathy, large cell lymphoma status post chemo, uterine prolapse status post spastic bladder and recurrent UTI including Carbapenem resistant enterococci and Klebsiella pneumonia admitted for UTI and parainfluenza virus infection  Principal Problem:   UTI (urinary tract infection) Active Problems:   Hereditary and idiopathic peripheral neuropathy   History of CVA (cerebrovascular accident)   Diabetes ([place])   Dyslipidemia   PAF (paroxysmal atrial fibrillation) ([place])   Essential hypertension   History of uterine prolapse   Subjective: Evaluated at bedside laying comfortably in bed.  Cough is getting better She was able to void last night with decrease urinary retention Pending PICC line placement today  Medications:   apixaban  5 mg Oral BID   Chlorhexidine Gluconate Cloth  6 each Topical Daily   ezetimibe  5 mg Oral Daily   gabapentin  300 mg Oral Daily   metoprolol succinate  12.5 mg Oral Daily   pravastatin  20 mg Oral Daily   sodium chloride flush  10-40 mL Intracatheter Q12H   sodium chloride flush  3 mL Intravenous Q12H    Objective: Vital signs in last 24 hours: Temp:  [97.7 F (36.5 C)-98.3 F (36.8 C)] 97.7 F (36.5 C) (12/01 0620) Pulse Rate:  [62-81] 62 (12/01 0620) Resp:  [15-20] 15 (12/01 0620) BP: (103-149)/(89-92) 149/92 (12/01 0620) SpO2:  [96 %-99 %] 96 % (12/01 0620)   Physical exam General: Pleasant, well-appearing elderly woman laying in bed. No acute distress. HEENT: Moist mucous membrane. No lymphadenopathy. CV: RRR. No murmurs, rubs, or gallops. No LE edema Pulmonary: Lungs CTAB. Normal effort. Mild expiratory wheezing. No rales Abdominal: Soft, nontender, nondistended. Normal bowel sounds. Extremities: Palpable radial and DP pulses. Normal  ROM. Skin: Warm and dry. No obvious rash or lesions. Neuro: A&Ox3. Moves all extremities. Normal sensation. No focal deficit. Psych: Normal mood and affect  Lab Results Recent Labs    05/18/22 0450 05/18/22 0451 05/19/22 0545  WBC 7.2 7.0  --   HGB 11.4* 11.4*  --   HCT 34.7* 35.1*  --   NA  --  134* 138  K  --  3.6 3.3*  CL  --  102 106  CO2  --  24 23  BUN  --  25* 20  CREATININE  --  1.21* 1.06*   Liver Panel Recent Labs    05/17/22 1830 05/18/22 0451  PROT 7.0 7.0  ALBUMIN 3.1* 3.0*  AST 26 27  ALT 22 22  ALKPHOS 67 62  BILITOT 0.7 1.1   Sedimentation Rate No results for input(s): "ESRSEDRATE" in the last 72 hours. C-Reactive Protein No results for input(s): "CRP" in the last 72 hours.  Microbiology: Blood culture 11/28 >> no growth in 3 days Urine culture 11/29 >> Klebsiella pnemoniae  Studies/Results: [place] EKG SITE RITE  Result Date: 05/19/2022 If Site Rite image not attached, placement could not be confirmed due to current cardiac rhythm.  DG Chest Port 1 View  Result Date: 05/18/2022 CLINICAL DATA:  Cough EXAM: PORTABLE CHEST 1 VIEW COMPARISON:  06/05/2019 FINDINGS: Heart size is normal. Calcification and tortuosity of the aorta. The lungs are clear. No infiltrate, collapse or effusion. No edema. No significant bone finding of an acute  nature. Probable chronic degenerative change in rotator cuff pathology of the right shoulder. IMPRESSION: No active disease. Aortic atherosclerosis. Electronically Signed   By: Nelson Chimes M.D.   On: 05/18/2022 11:31     Assessment/Plan: Assessment/Plan:  PMH of HTN, HLD, A-fib, CVA, T2DM, neuropathy, large cell lymphoma status post chemo, uterine prolapse status post spastic bladder and recurrent UTI including Carbapenem resistant enterococci and Klebsiella pneumonia presenting from ID clinic for evaluation of generalized malaise with concern for possible UTI. Findings consistent with klebsiella pneumonia UTI and  parainfluenza virus. Bcx NGTD. Urinary and respiratory symptoms improving.    #MDR UTI -PICC line placed this AM -Continue Avycaz to complete 7-day course at home -Can be discharged later today from ID perspective after IV abx infusion education -We will schedule outpatient follow up -Contact precautions -Close follow-up urology in the outpatient to evaluate need for intermittent self-catheterization.    #Parainfluenza virus CXR negative for pneumonia. Remains on room air. Some wheezing on exam today. Cough has improved. -Continue droplet precautions -Continue prn guaifenesin for cough -Continue prn albuterol nebs  Wendie Diskin [facility] for Infectious Diseases Pager: [phone]  05/20/2022, 9:27 AM

## 2022-05-20 NOTE — Discharge Summary (Signed)
Physician Discharge Summary   Patient: [name] MRN: [id number] DOB: Aug 11, 1940  Admit date:     05/17/2022  Discharge date: 05/20/22  Discharge Physician: Elmarie Shiley   PCP: [name], MD   Recommendations at discharge:    Needs referral to Urology  for further evaluation of urinary incontinence retention.  Follow up with ID after completion antibiotics for UTI.   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 *  Hospital Course: 81 year old with past medical history significant for hypertension, hyperlipidemia, A-fib, CVA, diabetes, neuropathy, large cell lymphoma status post chemo, uterine prolapse with spastic bladder and recurrent UTI, prior resistant carbapenems and Enterococcus presents to the ID clinic complaining of increased urge to urinate, increased urinary frequency, night sweat, fatigue and somnolence.   She was referred for admission for evaluation of UTI.  In and out cath show a UA with more than 50 white blood cells, positive nitrates.  Patient has been started on Avycaz.    Assessment and Plan: 1-UTI;  UA with more than 50 WBC.  Started on [employer].  ID following.  Urine culture: Klebsiella Pneumonia.  Plan to discharge on [place] until 12/06. Discharge with Picc line.  No further retention she has been urinating.   2-Hypertension: Continue with metoprolol Hold olmesartan and hydrochlorothiazide due to concern for infection and dehydration PRN hydralazine.    3-Hyperlipidemia: Continue with pravastatin   4-A-fib: Continue with Eliquis and  metoprolol   5-History of CVA: Continue with Eliquis, Zetia and pravastatin   6-Diabetes: Continue with a sliding scale insulin   7-Neuropathy: Continue with gabapentin.    History of lymphoma:  Status post ischemia.  Cough; Para-Influenza positive Resume albuterol. Covid negative.  Hypokalemia; Replaced.  Obesity; needs life style modification   weakness; feels better. Has been ambulating.         Consultants: ID Procedures performed Disposition: Home Diet recommendation:  Discharge Diet Orders (From admission, onward)     Start     Ordered   05/20/22 0000  Diet - low sodium heart healthy        05/20/22 1221           Carb modified diet DISCHARGE MEDICATION: Allergies as of 05/20/2022   No Known Allergies      Medication List     STOP taking these medications    dextromethorphan-guaiFENesin 30-600 MG 12hr tablet Commonly known as: MUCINEX DM   olmesartan 40 MG tablet Commonly known as: BENICAR       TAKE these medications    apixaban 5 MG Tabs tablet Commonly known as: ELIQUIS Take 1 tablet (5 mg total) by mouth 2 (two) times daily.   ceftazidime-avibactam 2-0.5 G Solr injection Commonly known as: AVYCAZ Inject 1,250 mg (1.25 g total) into the vein every 8 (eight) hours for 5 days. Indication:  CRE UTI  First Dose: Yes Last Day of Therapy:  05/25/22 Labs - Once weekly:  CBC/D and BMP, Labs - Every other week:  ESR and CRP Method of administration: Mini-Bag Plus / Control-A-Flo (CAF) Method of administration may be changed at the discretion of home  infusion pharmacist based upon assessment of the patient and/or caregiver's ability to self-administer the medication ordered. Start taking on: May 21, 2022   CENTRUM SILVER PO Take 1 tablet by mouth daily.   cyanocobalamin 1000 MCG tablet Commonly known as: VITAMIN B12 Take 1,000 mcg by mouth daily.   ezetimibe 10 MG tablet Commonly known as: Zetia Take 1 tablet (10 mg total) by mouth  daily. What changed:  how much to take when to take this additional instructions   FISH OIL PO Take 1,200 mg by mouth daily.   gabapentin 300 MG capsule Commonly known as: NEURONTIN Take 1 capsule (300 mg total) by mouth 3 (three) times daily. What changed: when to take this   guaiFENesin 100 MG/5ML liquid Commonly known as: ROBITUSSIN Take 5 mLs by mouth every 4 (four) hours as needed for cough or to loosen phlegm.   hydrochlorothiazide 12.5 MG tablet Commonly known as: HYDRODIURIL Take 12.5 mg by mouth daily.   metFORMIN 500 MG tablet Commonly known as: GLUCOPHAGE Take 500 mg by mouth daily.   metoprolol succinate 25 MG 24 hr tablet Commonly known as: Toprol XL Take 0.5 tablets (12.5 mg total) by mouth daily. What changed: how much to take   Myrbetriq 50 MG Tb24 tablet Generic drug: mirabegron ER Take 50 mg by mouth daily.   pravastatin 20 MG tablet Commonly known as: Pravachol Take 1 tablet (20 mg total) by mouth daily. What changed: when to take this   ProAir RespiClick 962 (90 Base) MCG/ACT Aepb Generic drug: Albuterol Sulfate Inhale 1 puff into the lungs every 6 (six) hours as needed. What changed:  how much to take how to take this when to take this reasons to take this   VITAMIN D PO Take 1,000 Units by mouth daily.               Discharge Care Instructions  (From admission, onward)           Start     Ordered   05/20/22 0000  Change dressing on IV access line weekly and PRN  (Home infusion instructions - [facility] )        05/20/22 1128            Follow-up Information     [name], MD Follow up.   Specialty: Internal Medicine Contact information: [place] [address] [phone]         Tommy Medal, Lavell Islam, MD Follow up on 07/04/2022.   Specialty: Infectious Diseases Why: 10:15 pm appointment with Dr. Tommy Medal. Please arrive 15 min early Contact information: [address]  [place] [address] [phone]                Discharge Exam: Danley Danker Weights   05/18/22 0523  Weight: 69.3 kg   General; NAD  Condition at discharge: stable  The results of significant diagnostics from this hospitalization (including imaging, microbiology, ancillary and laboratory) are listed below for reference.   Imaging Studies: DG Chest Port 1 View  Result Date: 05/20/2022 CLINICAL DATA:  PICC placement EXAM: PORTABLE CHEST 1 VIEW COMPARISON:  05/18/2022 chest radiograph. FINDINGS: Right PICC terminates in the middle third of the SVC. Stable cardiomediastinal silhouette with normal heart size. No pneumothorax. No pleural effusion. Lungs appear clear, with no acute consolidative airspace disease and no pulmonary edema. IMPRESSION: Right PICC terminates in the middle third of the SVC. No active cardiopulmonary disease. Electronically Signed   By:  Ilona Sorrel M.D.   On: 05/20/2022 10:04   [place] EKG SITE RITE  Result Date: 05/19/2022 If Site Rite image not attached, placement could not be confirmed due to current cardiac rhythm.  DG Chest Port 1 View  Result Date: 05/18/2022 CLINICAL DATA:  Cough EXAM: PORTABLE CHEST 1 VIEW COMPARISON:  06/05/2019 FINDINGS: Heart size is normal. Calcification and tortuosity of the aorta. The lungs are clear. No infiltrate, collapse or effusion. No edema. No significant bone finding of an acute nature. Probable chronic degenerative change in rotator cuff pathology of the right shoulder. IMPRESSION: No active disease. Aortic atherosclerosis. Electronically Signed   By: Nelson Chimes M.D.   On: 05/18/2022 11:31    Microbiology: Results for orders placed or performed during the hospital encounter of 05/17/22  Urine Culture     Status: Abnormal (Preliminary result)   Collection Time: 05/17/22  6:00 PM   Specimen: Urine, Clean Catch  Result Value Ref Range Status   Specimen Description URINE, CLEAN CATCH  Final   Special Requests  NONE  Final   Culture (A)  Final    >=100,000 COLONIES/mL KLEBSIELLA PNEUMONIAE ATTEMPTING TO ISOLATE FOR REPEAT SUSCEPTIBILITIES Performed at [facility], [place] [address], [place], [place] [address]    Report Status PENDING  Incomplete  Culture, blood (Routine X 2) w Reflex to ID Panel     Status: None (Preliminary result)   Collection Time: 05/17/22  6:40 PM   Specimen: BLOOD LEFT ARM  Result Value Ref Range Status   Specimen Description BLOOD LEFT ARM  Final   Special Requests   Final    BOTTLES DRAWN AEROBIC AND ANAEROBIC Blood Culture adequate volume   Culture   Final    NO GROWTH 3 DAYS Performed at [facility], [place] [address], [place], [place] [address]    Report Status PENDING  Incomplete  Culture, blood (Routine X 2) w Reflex to ID Panel     Status: None (Preliminary result)   Collection Time: 05/17/22  6:41 PM   Specimen: BLOOD RIGHT ARM  Result Value Ref Range Status   Specimen Description BLOOD RIGHT ARM  Final   Special Requests   Final    BOTTLES DRAWN AEROBIC AND ANAEROBIC Blood Culture adequate volume   Culture   Final    NO GROWTH 3 DAYS Performed at [facility], [place] [address], [place], [place] [address]    Report Status PENDING  Incomplete  SARS Coronavirus 2 by RT PCR (hospital order, performed in [facility] hospital lab) *cepheid single result test* Urine, In & Out Cath     Status: None   Collection Time: 05/18/22 10:12 AM   Specimen: Urine, In & Out Cath; Nasal Swab  Result Value Ref Range Status   SARS Coronavirus 2 by RT PCR NEGATIVE NEGATIVE Final    Comment: (NOTE) SARS-CoV-2 target nucleic acids are NOT DETECTED.  The SARS-CoV-2 RNA is generally detectable in upper and lower respiratory specimens during the acute phase of infection. The lowest concentration of SARS-CoV-2 viral copies this assay can detect is 250 copies / mL. A negative result does not preclude SARS-CoV-2 infection and should not be used as the sole basis for  treatment or other patient management decisions.  A negative result may occur with improper specimen collection / handling, submission of specimen other than nasopharyngeal swab, presence of viral mutation(s) within the areas targeted by this assay, and inadequate number of viral copies (<250 copies /  mL). A negative result must be combined with clinical observations, patient history, and epidemiological information.  Fact Sheet for Patients:   https://www.patel.info/  Fact Sheet for Healthcare Providers: https://hall.com/  This test is not yet approved or  cleared by the [place] FDA and has been authorized for detection and/or diagnosis of SARS-CoV-2 by FDA under an Emergency Use Authorization (EUA).  This EUA will remain in effect (meaning this test can be used) for the duration of the COVID-19 declaration under Section 564(b)(1) of the Act, 21 U.S.C. section 360bbb-3(b)(1), unless the authorization is terminated or revoked sooner.  Performed at [facility], [place] [address] [place], [place] [address]   Respiratory (~20 pathogens) panel by PCR     Status: Abnormal   Collection Time: 05/18/22  3:00 PM   Specimen: Nasopharyngeal Swab; Respiratory  Result Value Ref Range Status   Adenovirus NOT DETECTED NOT DETECTED Final   Coronavirus 229E NOT DETECTED NOT DETECTED Final    Comment: (NOTE) The Coronavirus on the Respiratory Panel, DOES NOT test for the novel  Coronavirus (2019 nCoV)    Coronavirus HKU1 NOT DETECTED NOT DETECTED Final   Coronavirus NL63 NOT DETECTED NOT DETECTED Final   Coronavirus OC43 NOT DETECTED NOT DETECTED Final   Metapneumovirus NOT DETECTED NOT DETECTED Final   Rhinovirus / Enterovirus NOT DETECTED NOT DETECTED Final   Influenza A NOT DETECTED NOT DETECTED Final   Influenza B NOT DETECTED NOT DETECTED Final   Parainfluenza Virus 1 DETECTED (A) NOT DETECTED Final   Parainfluenza Virus 2 NOT  DETECTED NOT DETECTED Final   Parainfluenza Virus 3 NOT DETECTED NOT DETECTED Final   Parainfluenza Virus 4 NOT DETECTED NOT DETECTED Final   Respiratory Syncytial Virus NOT DETECTED NOT DETECTED Final   Bordetella pertussis NOT DETECTED NOT DETECTED Final   Bordetella Parapertussis NOT DETECTED NOT DETECTED Final   Chlamydophila pneumoniae NOT DETECTED NOT DETECTED Final   Mycoplasma pneumoniae NOT DETECTED NOT DETECTED Final    Comment: Performed at [facility], [place]. [address], [place], [place] [address]    Labs: CBC: Recent Labs  Lab 05/17/22 1830 05/18/22 0450 05/18/22 0451  WBC 7.1 7.2 7.0  NEUTROABS  --  3.0  --   HGB 11.3* 11.4* 11.4*  HCT 34.2* 34.7* 35.1*  MCV 95.3 95.9 95.6  PLT 203 225 073   Basic Metabolic Panel: Recent Labs  Lab 05/17/22 1830 05/18/22 0451 05/19/22 0545  NA 136 134* 138  K 3.8 3.6 3.3*  CL 103 102 106  CO2 _0 GLUCOSE 107* 96 121*  BUN 27* 25* 20  CREATININE 1.33* 1.21* 1.06*  CALCIUM 9.0 8.8* 8.7*   Liver Function Tests: Recent Labs  Lab 05/17/22 1830 05/18/22 0451  AST 26 27  ALT 22 22  ALKPHOS 67 62  BILITOT 0.7 1.1  PROT 7.0 7.0  ALBUMIN 3.1* 3.0*   CBG: No results for input(s): "GLUCAP" in the last 168 hours.  Discharge time spent: greater than 30 minutes.  Signed: Elmarie Shiley, MD [facility] 05/20/2022

## 2022-05-20 NOTE — Progress Notes (Signed)
Peripherally Inserted Central Catheter Placement  The IV Nurse has discussed with the patient and/or persons authorized to consent for the patient, the purpose of this procedure and the potential benefits and risks involved with this procedure.  The benefits include less needle sticks, lab draws from the catheter, and the patient may be discharged home with the catheter. Risks include, but not limited to, infection, bleeding, blood clot (thrombus formation), and puncture of an artery; nerve damage and irregular heartbeat and possibility to perform a PICC exchange if needed/ordered by physician.  Alternatives to this procedure were also discussed.  Bard Power PICC patient education guide, fact sheet on infection prevention and patient information card has been provided to patient /or left at bedside.    PICC Placement Documentation  PICC Single Lumen 05/39/76 Right Basilic 33 cm 0 cm (Active)  Indication for Insertion or Continuance of Line Home intravenous therapies (PICC only) 05/20/22 0920  Exposed Catheter (cm) 0 cm 05/20/22 0920  Site Assessment Clean, Dry, Intact 05/20/22 0920  Line Status Flushed;Blood return noted;Saline locked 05/20/22 0920  Dressing Type Transparent 05/20/22 0920  Dressing Status Antimicrobial disc in place 05/20/22 [place] checked and tightened 05/20/22 0920  Dressing Intervention Dressing changed 05/20/22 0920  Dressing Change Due 05/27/22 05/20/22 0920       Scotty Court 05/20/2022, 9:23 AM

## 2022-05-22 LAB — CULTURE, BLOOD (ROUTINE X 2)
Culture: NO GROWTH
Culture: NO GROWTH
Special Requests: ADEQUATE
Special Requests: ADEQUATE

## 2022-05-23 LAB — CARBAPENEM RESISTANCE PANEL
Carba Resistance IMP Gene: NOT DETECTED
Carba Resistance KPC Gene: DETECTED — AB
Carba Resistance NDM Gene: NOT DETECTED
Carba Resistance OXA48 Gene: NOT DETECTED
Carba Resistance VIM Gene: NOT DETECTED

## 2022-05-23 LAB — URINE CULTURE: Culture: >=100000 — AB

## 2022-05-26 ENCOUNTER — Ambulatory Visit: Payer: Medicare Other

## 2022-06-01 ENCOUNTER — Other Ambulatory Visit: Payer: Self-pay

## 2022-06-01 ENCOUNTER — Encounter: Payer: Self-pay | Admitting: Internal Medicine

## 2022-06-01 ENCOUNTER — Ambulatory Visit (INDEPENDENT_AMBULATORY_CARE_PROVIDER_SITE_OTHER): Payer: Medicare Other | Admitting: Internal Medicine

## 2022-06-01 VITALS — BP 151/88 | HR 67 | Temp 97.6°F | Ht <= 58 in | Wt 155.0 lb

## 2022-06-01 DIAGNOSIS — N390 Urinary tract infection, site not specified: Secondary | ICD-10-CM

## 2022-06-01 NOTE — Progress Notes (Unsigned)
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   S/P lumbar laminectomy 06/07/2019   Abnormal EKG 03/06/2018   Preop cardiovascular exam 03/06/2018   Chronic anticoagulation 04/21/2015   PAF (paroxysmal atrial fibrillation) ([place]) 02/20/2015   Essential hypertension 02/20/2015   Dyslipidemia    History of CVA (cerebrovascular accident) 02/16/2015   Diabetes ([place]) 02/16/2015   Atrial flutter ([place]) 02/16/2015   Hereditary and idiopathic peripheral neuropathy 12/16/2014   Non Hodgkin's lymphoma ([place]) 11/30/2009   UNSPECIFIED DISORDER OF KIDNEY AND URETER 11/30/2009    Patient's Medications  New Prescriptions   No medications on file  Previous Medications   ALBUTEROL SULFATE (PROAIR RESPICLICK) 295 (90 BASE) MCG/ACT AEPB    Inhale 1 puff into the lungs every 6 (six) hours as needed.   APIXABAN (ELIQUIS) 5 MG TABS TABLET    Take 1 tablet (5 mg total) by mouth 2 (two) times daily.   CHOLECALCIFEROL (VITAMIN D PO)    Take 1,000 Units by mouth daily.   EZETIMIBE (ZETIA) 10 MG TABLET    Take 1 tablet (10 mg total) by mouth daily.   GABAPENTIN (NEURONTIN) 300 MG CAPSULE    Take 1 capsule (300 mg total) by mouth 3 (three) times daily.   GUAIFENESIN (ROBITUSSIN) 100 MG/5ML LIQUID    Take 5 mLs by mouth every 4 (four) hours as needed for cough or to loosen phlegm.   HYDROCHLOROTHIAZIDE (HYDRODIURIL) 12.5 MG TABLET    Take 12.5 mg by mouth daily.   METFORMIN (GLUCOPHAGE) 500 MG TABLET    Take 500 mg by mouth daily.   METOPROLOL SUCCINATE (TOPROL XL) 25 MG 24 HR TABLET    Take 0.5 tablets (12.5 mg total) by mouth  daily.   MIRABEGRON ER (MYRBETRIQ) 50 MG TB24 TABLET    Take 50 mg by mouth daily.   MULTIPLE VITAMINS-MINERALS (CENTRUM SILVER PO)    Take 1 tablet by mouth daily.   OMEGA-3 FATTY ACIDS (FISH OIL PO)    Take 1,200 mg by mouth daily.   PRAVASTATIN (PRAVACHOL) 20 MG TABLET    Take 1 tablet (20 mg total) by mouth daily.   VITAMIN B-12 (CYANOCOBALAMIN) 1000 MCG TABLET    Take 1,000 mcg by mouth daily.  Modified Medications   No medications on file  Discontinued Medications   No medications on file    Subjective: 81 year old female with past medical history of hypertension, hyperlipidemia, A-fib, CVA, type 2 diabetes, large cell lymphoma status post chemo, urine Proctolog status post spastic bladder with recurrent UTI including Kleb pneumo with KBC gene/CRE presents for hospital follow-up of hospitalization for parainfluenza virus and concern for possible UTI.  Patient had a temp of 99.2, WBC 7K on arrival.  Chest x-ray is negative, respiratory screen positive for parainfluenza virus.  Blood cultures were negative.  She had initially presented to Dr. Arlyss Queen clinic with increased fatigue and sent to the ED for further evaluation.  Urine cultures again grew Kleb pneumo, with KBC gene she was discharged on Avycaz to complete 1 week course EOT 12/6.  Review of Systems: Review of Systems  All other systems reviewed and  are negative.   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   Cerebral infarction due to embolism of right middle cerebral artery ([place])    Diabetes mellitus    Diarrhea 11/30/2009   Qualifier: Diagnosis of   By: Tommy Medal MD, Cornelius       Excessive sleepiness 05/16/2022   Fatigue 05/16/2022   Fatty liver    Fibromyalgia    History of uterine prolapse 04/04/2022   Hyperlipidemia    Hypertension    Lymphoma malignant,  large cell ([place])    Neuropathy    Night sweat 05/16/2022   PAF (paroxysmal atrial fibrillation) ([place])    Slurred speech 02/16/2015   Stroke ([place])    Syncope 03/06/2018   Urge incontinence 04/04/2022   Urinary tract infection 09/19/2011   Being treated at this time Cipro    Social History   Tobacco Use   Smoking status: Never   Smokeless tobacco: Never  Vaping Use   Vaping Use: Never used  Substance Use Topics   Alcohol use: Yes    Alcohol/week: 1.0 standard drink of alcohol    Types: 1 Glasses of wine per week    Comment: Rare   Drug use: No    Family History  Problem Relation Age of Onset   Heart Problems Brother    Cancer Sister     No Known Allergies  Health Maintenance  Topic Date Due   Medicare Annual Wellness (AWV)  Never done   FOOT EXAM  Never done   OPHTHALMOLOGY EXAM  Never done   Diabetic kidney evaluation - Urine ACR  Never done   Zoster Vaccines- Shingrix (1 of 2) Never done   Pneumonia Vaccine 55+ Years old (1 - PCV) Never done   DEXA SCAN  Never done   HEMOGLOBIN A1C  12/06/2019   COVID-19 Vaccine (4 - 2023-24 season) 02/18/2022   Diabetic kidney evaluation - eGFR measurement  05/21/2023   DTaP/Tdap/Td (2 - Td or Tdap) 09/19/2028   INFLUENZA VACCINE  Completed   HPV VACCINES  Aged Out    Objective:  There were no vitals filed for this visit. There is no height or weight on file to calculate BMI.  Physical Exam Constitutional:      Appearance: Normal appearance.  HENT:     Head: Normocephalic and atraumatic.     Right Ear: Tympanic membrane normal.     Left Ear: Tympanic membrane normal.     Nose: Nose normal.     Mouth/Throat:     Mouth: Mucous membranes are moist.  Eyes:     Extraocular Movements: Extraocular movements intact.     Conjunctiva/sclera: Conjunctivae normal.     Pupils: Pupils are equal, round, and reactive to light.  Cardiovascular:     Rate and Rhythm: Normal rate and regular rhythm.     Heart sounds: No murmur  heard.    No friction rub. No gallop.  Pulmonary:     Effort: Pulmonary effort is normal.     Breath sounds: Normal breath sounds.  Abdominal:     General: Abdomen is flat.     Palpations: Abdomen is soft.  Musculoskeletal:        General: Normal range of motion.  Skin:    General: Skin is warm and dry.  Neurological:     General:  No focal deficit present.     Mental Status: She is alert and oriented to person, place, and time.  Psychiatric:        Mood and Affect: Mood normal.     Lab Results Lab Results  Component Value Date   WBC 7.0 05/18/2022   HGB 11.4 (L) 05/18/2022   HCT 35.1 (L) 05/18/2022   MCV 95.6 05/18/2022   PLT 220 05/18/2022    Lab Results  Component Value Date   CREATININE 0.91 05/20/2022   BUN 17 05/20/2022   NA 140 05/20/2022   K 4.6 05/20/2022   CL 108 05/20/2022   CO2 26 05/20/2022    Lab Results  Component Value Date   ALT 22 05/18/2022   AST 27 05/18/2022   ALKPHOS 62 05/18/2022   BILITOT 1.1 05/18/2022    Lab Results  Component Value Date   CHOL 142 01/24/2020   HDL 68 01/24/2020   LDLCALC 58 01/24/2020   TRIG 84 01/24/2020   CHOLHDL 2.1 01/24/2020   No results found for: "LABRPR", "RPRTITER" No results found for: "HIV1RNAQUANT", "HIV1RNAVL", "CD4TABS"   Assessment and Plan #Concern for UTI in the setting of CRE Kleb pneumo #Parainfluenza virus -Avycaz 11/29-12/6 -Patient reports her cough started improving yesterday.  Is continue to have some fatigue. I suspect her symptoms may have been secondary to prior close a virus of the UTI.  She did note that at time of presentation to the ED she had some dysuria and pelvic pressure that improved with antibiotic course. - Pull PICC line - CBC and CMP today - Follow-up with Dr. Drucilla Schmidt on 1/15, previously scheduled - Follow-up with Urology, patient has appointment   I have personally spent 45 minutes involved in face-to-face and non-face-to-face activities for this patient on the day of  the visit. Professional time spent includes the following activities: Preparing to see the patient (review of tests), Obtaining and/or reviewing separately obtained history (admission/discharge record), Performing a medically appropriate examination and/or evaluation , Ordering medications/tests/procedures, referring and communicating with other health care professionals, Documenting clinical information in the EMR, Independently interpreting results (not separately reported), Communicating results to the patient/family/caregiver, Counseling and educating the patient/family/caregiver and Care coordination (not separately reported).   Laurice Record, MD [place] 06/01/2022, 8:37 AM

## 2022-06-01 NOTE — Progress Notes (Addendum)
PICC Labs  PICC line flushed with 10 mL normal saline and blood return obtained without difficulty. Labs drawn via PICC line per Dr. Candiss Norse. Line flushed with 10 mL normal saline and clamped after draw. Cap change was not done. PICC dressing clean, dry, and intact. Last changed on Monday, May 30, 2022. No pain, discharge, erythema, or edema noted at site. Patient tolerated procedure well.  Binnie Kand, RN   PICC Removal    PICC length & location:  Right basilic 33 cm Removed per verbal order from: Dr. Candiss Norse  Blood thinners:  Elequis 5 mg Platelet count:  268 on 05/30/22  Site assessment: Dressing clean and dry. Extremity warm and dry with palpable radial pulse. No redness, drainage, or swelling present at insertion site.   Pre-removal vital signs:  BP:  144/91 HR:  74 SpO2:  98%  Patient unable to tolerate flat, supine position. Remained seated. No sutures present. Insertion site cleaned with CHG, catheter removed and petroleum dressing applied. Tip intact. Pressure held until hemostasis achieved.    Length of catheter removed:  33 cm   Provided patient with after care instructions and precautions print out (via [facility]). Reviewed this information with patient and spouse. Understanding demonstrated by patient teach back.  Patient verbalized understanding and agreement, all questions answered. Patient tolerated procedure well and remained in clinic under the care of RN 30 minutes post removal.   Post-observation vital signs:  BP:  151/88 HR:  67 SpO2:  99%  Notified [facility] and [facility] pharmacy team of removal via [employer].  Binnie Kand, RN

## 2022-06-02 LAB — COMPREHENSIVE METABOLIC PANEL
AG Ratio: 1.2 (calc) (ref 1.0–2.5)
ALT: 17 U/L (ref 6–29)
AST: 23 U/L (ref 10–35)
Albumin: 3.6 g/dL (ref 3.6–5.1)
Alkaline phosphatase (APISO): 85 U/L (ref 37–153)
BUN/Creatinine Ratio: 22 (calc) (ref 6–22)
BUN: 23 mg/dL (ref 7–25)
CO2: 28 mmol/L (ref 20–32)
Calcium: 9.3 mg/dL (ref 8.6–10.4)
Chloride: 103 mmol/L (ref 98–110)
Creat: 1.03 mg/dL — ABNORMAL HIGH (ref 0.60–0.95)
Globulin: 3.1 g/dL (calc) (ref 1.9–3.7)
Glucose, Bld: 140 mg/dL — ABNORMAL HIGH (ref 65–99)
Potassium: 3.9 mmol/L (ref 3.5–5.3)
Sodium: 140 mmol/L (ref 135–146)
Total Bilirubin: 0.7 mg/dL (ref 0.2–1.2)
Total Protein: 6.7 g/dL (ref 6.1–8.1)

## 2022-06-02 LAB — CBC WITH DIFFERENTIAL/PLATELET
Absolute Monocytes: 564 cells/uL (ref 200–950)
Basophils Absolute: 37 cells/uL (ref 0–200)
Basophils Relative: 0.6 %
Eosinophils Absolute: 223 cells/uL (ref 15–500)
Eosinophils Relative: 3.6 %
HCT: 32.4 % — ABNORMAL LOW (ref 35.0–45.0)
Hemoglobin: 10.9 g/dL — ABNORMAL LOW (ref 11.7–15.5)
Lymphs Abs: 2635 cells/uL (ref 850–3900)
MCH: 32.1 pg (ref 27.0–33.0)
MCHC: 33.6 g/dL (ref 32.0–36.0)
MCV: 95.3 fL (ref 80.0–100.0)
MPV: 10.8 fL (ref 7.5–12.5)
Monocytes Relative: 9.1 %
Neutro Abs: 2740 cells/uL (ref 1500–7800)
Neutrophils Relative %: 44.2 %
Platelets: 252 10*3/uL (ref 140–400)
RBC: 3.4 10*6/uL — ABNORMAL LOW (ref 3.80–5.10)
RDW: 13.9 % (ref 11.0–15.0)
Total Lymphocyte: 42.5 %
WBC: 6.2 10*3/uL (ref 3.8–10.8)

## 2022-07-04 ENCOUNTER — Inpatient Hospital Stay: Payer: Medicare Other | Admitting: Infectious Disease

## 2022-07-12 ENCOUNTER — Inpatient Hospital Stay: Payer: Medicare Other | Admitting: Internal Medicine

## 2022-07-18 ENCOUNTER — Ambulatory Visit (INDEPENDENT_AMBULATORY_CARE_PROVIDER_SITE_OTHER): Payer: Medicare Other | Admitting: Internal Medicine

## 2022-07-18 ENCOUNTER — Other Ambulatory Visit: Payer: Self-pay

## 2022-07-18 ENCOUNTER — Encounter: Payer: Self-pay | Admitting: Internal Medicine

## 2022-07-18 VITALS — BP 157/78 | HR 80 | Temp 97.6°F | Wt 149.8 lb

## 2022-07-18 DIAGNOSIS — N390 Urinary tract infection, site not specified: Secondary | ICD-10-CM | POA: Diagnosis not present

## 2022-07-18 NOTE — Progress Notes (Signed)
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   S/P lumbar laminectomy 06/07/2019   Abnormal EKG 03/06/2018   Preop cardiovascular exam 03/06/2018   Chronic anticoagulation 04/21/2015   PAF (paroxysmal atrial fibrillation) ([place]) 02/20/2015   Essential hypertension 02/20/2015   Dyslipidemia    History of CVA (cerebrovascular accident) 02/16/2015   Diabetes ([place]) 02/16/2015   Atrial flutter ([place]) 02/16/2015   Hereditary and idiopathic peripheral neuropathy 12/16/2014   Non Hodgkin's lymphoma ([place]) 11/30/2009   UNSPECIFIED DISORDER OF KIDNEY AND URETER 11/30/2009     Subjective:  82 year old female with past medical history of hypertension, hyperlipidemia, A-fib, CVA, type 2 diabetes, large cell lymphoma status post chemo, urine  status post spastic bladder with 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  Patient had a temp of 99.2, WBC 7K on arrival.  Chest x-ray is negative, respiratory screen positive for parainfluenza virus.  Blood cultures were negative.  She had initially presented to Dr. Arlyss Queen clinic with increased fatigue and sent to the ED for further evaluation.  Urine cultures again grew Kleb pneumo, with KBC gene she was discharged on Avycaz to complete 1 week course EOT 12/6. 12/16 Presents to with husband. Reports cough is improving. Denies fevers, chills, dysuria.  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   Cerebral infarction due to embolism of right middle cerebral artery ([place])    Diabetes mellitus    Diarrhea 11/30/2009   Qualifier: Diagnosis of   By: Tommy Medal MD, Cornelius       Excessive sleepiness 05/16/2022   Fatigue 05/16/2022   Fatty liver    Fibromyalgia    History of uterine prolapse 04/04/2022   Hyperlipidemia    Hypertension    Lymphoma malignant, 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 1 each 1   allopurinol (ZYLOPRIM) 100 MG tablet 1 tablet Orally Once a day TO PREVENT GOUT for 90 day(s)     apixaban (ELIQUIS) 5 MG TABS tablet Take 1 tablet (5 mg total) by mouth 2 (two) times daily. 60 tablet 3   benzonatate (TESSALON) 200 MG capsule 1 capsule Orally three times per day as needed for cough for 10 days     Cholecalciferol (VITAMIN D PO) Take 1,000 Units by mouth  daily.     ezetimibe (ZETIA) 10 MG tablet Take 1 tablet (10 mg total) by mouth daily. (Patient taking differently: Take 5 mg by mouth at bedtime. For cholesterol) 30 tablet 3   gabapentin (NEURONTIN) 300 MG capsule Take 1 capsule (300 mg total) by mouth 3 (three) times daily. (Patient taking differently: Take 300 mg by mouth at bedtime.) 90 capsule 3   guaiFENesin (ROBITUSSIN) 100 MG/5ML liquid Take 5 mLs by mouth every 4 (four) hours as needed for cough or to loosen  phlegm. (Patient not taking: Reported on 06/01/2022) 120 mL 0   hydrochlorothiazide (HYDRODIURIL) 12.5 MG tablet Take 12.5 mg by mouth daily.     metFORMIN (GLUCOPHAGE) 500 MG tablet Take 500 mg by mouth daily.     metoprolol succinate (TOPROL XL) 25 MG 24 hr tablet Take 0.5 tablets (12.5 mg total) by mouth daily. (Patient taking differently: Take 25 mg by mouth daily.) 90 tablet 3   mirabegron ER (MYRBETRIQ) 50 MG TB24 tablet Take 50 mg by mouth daily.     Multiple Vitamins-Minerals (CENTRUM SILVER PO) Take 1 tablet by mouth daily.     Omega-3 Fatty Acids (FISH OIL PO) Take 1,200 mg by mouth daily.     pravastatin (PRAVACHOL) 20 MG tablet Take 1 tablet (20 mg total) by mouth daily. (Patient taking differently: Take 20 mg by mouth at bedtime.) 30 tablet 0   predniSONE (DELTASONE) 10 MG tablet 2 tablets in the am with food for 3 days then 1 for 4 days Orally Once a day for 7 days     vitamin B-12 (CYANOCOBALAMIN) 1000 MCG tablet Take 1,000 mcg by mouth daily. (Patient not taking: Reported on 06/01/2022)     No facility-administered medications prior to visit.     No Known Allergies  Social History   Tobacco Use   Smoking status: Never   Smokeless tobacco: Never  Vaping Use   Vaping Use: Never used  Substance Use Topics   Alcohol use: Not Currently    Alcohol/week: 1.0 standard drink of alcohol    Types: 1 Glasses of wine per week    Comment: Rare   Drug use: No    Family History  Problem Relation Age of Onset   Heart Problems Brother    Cancer Sister     Objective:  There were no vitals filed for this visit. There is no height or weight on file to calculate BMI.  Physical Exam  Lab Results: Lab Results  Component Value Date   WBC 6.2 06/01/2022   HGB 10.9 (L) 06/01/2022   HCT 32.4 (L) 06/01/2022   MCV 95.3 06/01/2022   PLT 252 06/01/2022    Lab Results  Component Value Date   CREATININE 1.03 (H) 06/01/2022   BUN 23 06/01/2022   NA 140 06/01/2022   K 3.9  06/01/2022   CL 103 06/01/2022   CO2 28 06/01/2022    Lab Results  Component Value Date   ALT 17 06/01/2022   AST 23 06/01/2022   ALKPHOS 62 05/18/2022   BILITOT 0.7 06/01/2022     Assessment & Plan:  #Concern for UTI in the setting of CRE Kleb pneumo clonizaiton #Parainfluenza virus #Hx of incontinence followed by Urology -Presented to outpatient ID with fatigue and counseled to go to ED. Admitted with UTI(Kleb pneumo-KPC gene) and parainfluenza virus. PT was afebrile without leukocytosis on admission. Treated with Zadie Rhine 11/29-12/6 for UTI. I suspect her symptoms may have been secondary to parainfluenza virus rather than  a truly a UTI(colonized with kleb pneumo).  Although she did note that at time of presentation to the ED she had some dysuria and pelvic pressure that improved with antibiotics -PICC pulled on 12/16 and pt has no UTI symptoms. No leukocytosis at last set of labs Plan: -Urology F/U on Thursday per pt -Follow-up with DR. Tommy Medal in 3 months in the setting of recurrent MDR UTI  Laurice Record, MD [facility] for [facility] [place]  I have personally spent 36 minutes involved in face-to-face and non-face-to-face activities for this patient on the day of the visit. Professional time spent includes the following activities: Preparing to see the patient (review of tests), Obtaining and/or reviewing separately obtained history (admission/discharge record), Performing a medically appropriate examination and/or evaluation , Ordering medications/tests/procedures, referring and communicating with other health care professionals, Documenting clinical information in the EMR, Independently interpreting results (not separately reported), Communicating results to the patient/family/caregiver, Counseling and educating the patient/family/caregiver and Care coordination (not separately reported).    07/18/22  4:49 AM

## 2022-07-22 ENCOUNTER — Ambulatory Visit (INDEPENDENT_AMBULATORY_CARE_PROVIDER_SITE_OTHER): Payer: Medicare Other | Admitting: Obstetrics and Gynecology

## 2022-07-22 ENCOUNTER — Encounter: Payer: Self-pay | Admitting: Obstetrics and Gynecology

## 2022-07-22 VITALS — BP 176/85 | HR 69 | Ht <= 58 in | Wt 148.0 lb

## 2022-07-22 DIAGNOSIS — N813 Complete uterovaginal prolapse: Secondary | ICD-10-CM

## 2022-07-22 DIAGNOSIS — N390 Urinary tract infection, site not specified: Secondary | ICD-10-CM

## 2022-07-22 DIAGNOSIS — N816 Rectocele: Secondary | ICD-10-CM

## 2022-07-22 DIAGNOSIS — R339 Retention of urine, unspecified: Secondary | ICD-10-CM

## 2022-07-22 DIAGNOSIS — N39490 Overflow incontinence: Secondary | ICD-10-CM | POA: Diagnosis not present

## 2022-07-22 DIAGNOSIS — N393 Stress incontinence (female) (male): Secondary | ICD-10-CM

## 2022-07-22 DIAGNOSIS — R159 Full incontinence of feces: Secondary | ICD-10-CM

## 2022-07-22 DIAGNOSIS — N8110 Cystocele, unspecified: Secondary | ICD-10-CM

## 2022-07-22 DIAGNOSIS — R350 Frequency of micturition: Secondary | ICD-10-CM | POA: Diagnosis not present

## 2022-07-22 LAB — POCT URINALYSIS DIPSTICK
Bilirubin, UA: NEGATIVE
Glucose, UA: NEGATIVE
Ketones, UA: NEGATIVE
Nitrite, UA: POSITIVE
Protein, UA: POSITIVE — AB
Spec Grav, UA: 1.02 (ref 1.010–1.025)
Urobilinogen, UA: 0.2 E.U./dL
pH, UA: 7 (ref 5.0–8.0)

## 2022-07-22 NOTE — Patient Instructions (Signed)
Accidental Bowel Leakage: Our goal is to achieve formed bowel movements daily or every-other-day without leakage.  You may need to try different combinations of the following options to find what works best for you.  Some management options include: Dietary changes (more leafy greens, vegetables and fruits; less processed foods) Fiber supplementation (Metamucil or something with psyllium as active ingredient)- take once a day to prevent leakage Over-the-counter imodium (tablets or liquid) to help solidify the stool and prevent leakage of stool.

## 2022-07-22 NOTE — Progress Notes (Signed)
[facility] New Patient Evaluation and Consultation  Referring Provider: Bjorn Loser, MD PCP: [name], MD Date of Service: 07/22/2022  SUBJECTIVE Chief Complaint: New Patient (Initial Visit) [name] is a 82 y.o. female here for a consult for prolapse, fecal incontinence and urinary urgency.)  History of Present Illness: [name] is a 82 y.o. White or Caucasian female seen in consultation at the request of Dr. Matilde Sprang for evaluation of prolapse.    Review of records from Dr Lorra Hals significant for: Has history of right ureteral implant with psoas hitch. Has recurrent UTI with resistant cultures and has seen ID. Was using gemtesa for urge incontinence but had side effects of stuffy nose and discontinued. Was then started on oxybutynin. Offered estrogen cream but was not using it.   Last ID note 07/18/22:  - Treated with Avycaz 11/29- 12/6 for UTI. She is colonized with klebsiella but was symptomatic and symptoms improved after antibiotics. PICC pulled 12/16.   Urinary Symptoms: Leaks urine with cough/ sneeze, exercise, going from sitting to standing, with movement to the bathroom, with urgency, without sensation, and while asleep Leaks 10 time(s) per day. Has large gushes of urine. UUI > SUI symptoms.  Pad use: 7 pads per day.   She is bothered by her UI symptoms. She is currently on oxybutynin. She feels that she has some memory loss issues. She does not feel it is helping.  She has also been on Gemtesa previously which did not help.   Day time voids- every few hours.  Nocturia: 2-3 times per night to void. Voiding dysfunction: she empties her bladder well.  does not use a catheter to empty bladder.  When urinating, she feels she has no difficulties. Occasionally she will have urgency and feel like she has to go again.    UTIs:  several  UTI's in the last year- colonization with klebsiella, treated by ID. She is not having any UTI  symptoms currently.  Denies history of blood in urine and kidney or bladder stones  Pelvic Organ Prolapse Symptoms:                  Admits to a feeling of a bulge the vaginal area. It has been present for years Admits to seeing a bulge.  This bulge is bothersome. Has not had prior treatment.   Bowel Symptom: Bowel movements: 1 time(s) per day Stool consistency: soft  Straining: yes.  Splinting: yes.  Incomplete evacuation: yes.  Admits to accidental bowel leakage / fecal incontinence  Occurs: 1 time per week  Consistency with leakage: soft  Bowel regimen: diet. Has not taken a fiber supplement previously Last colonoscopy: Date- 08/2021, negative  Sexual Function Sexually active: no.    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)    Syncope 03/06/2018   Urge incontinence 04/04/2022   Urinary tract infection 09/19/2011   Being treated at this time Cipro     Past Surgical History:   Past Surgical History:  Procedure Laterality Date   APPENDECTOMY     CATARACT EXTRACTION  2015   CHOLECYSTECTOMY     EYE SURGERY     JOINT REPLACEMENT     JOINT REPLACEMENT     LAMINECTOMY WITH POSTERIOR LATERAL ARTHRODESIS LEVEL  2 N/A 06/07/2019   Procedure: Laminectomy and Foraminotomy Lumbar three-Lumbar four  Lumbar four-Lumbar five with non-instrumented fusion;  Surgeon: Eustace Moore, MD;  Location: [facility];  Service: Neurosurgery;  Laterality: N/A;   ROTATOR CUFF REPAIR       Past OB/GYN History: OB History  Gravida Para Term Preterm AB Living  '6 6 6     6  '$ SAB IAB Ectopic Multiple Live Births          6    # Outcome Date GA Lbr Len/2nd Weight Sex Delivery Anes PTL Lv  6 Term      Vag-Spont     5 Term      Vag-Spont     4 Term      Vag-Spont     3 Term      Vag-Spont     2 Term      Vag-Spont     1 Term      Vag-Spont      Menopausal: Denies vaginal bleeding since menopause Any history of abnormal pap smears: no.   Medications: She has a current medication list which includes the following prescription(s): proair respiclick, allopurinol, apixaban, benzonatate, vitamin d, ezetimibe, gabapentin, guaifenesin, hydrochlorothiazide, metformin, metoprolol succinate, mirabegron er, multiple vitamins-minerals, omega-3 fatty acids, oxybutynin, pravastatin, prednisone, and cyanocobalamin.   Allergies: Patient has No Known Allergies.   Social History:  Social History   Tobacco Use   Smoking status: Never   Smokeless tobacco: Never  Vaping Use   Vaping Use: Never used  Substance Use Topics   Alcohol use: Yes    Alcohol/week: 1.0 standard drink of alcohol    Types: 1 Glasses of wine per week    Comment: Rare   Drug use: No    Relationship status: married She lives with husband.   She is not employed. Regular exercise: No History of abuse: No  Family History:   Family History  Problem Relation Age of Onset   Heart Problems Brother    Cancer Sister      Review of Systems: Review of Systems  Constitutional:  Positive for malaise/fatigue and weight loss. Negative for fever.  Respiratory:  Negative for cough, shortness of breath and wheezing.   Cardiovascular:  Positive for leg swelling.  Negative for chest pain and palpitations.  Gastrointestinal:  Positive for abdominal pain. Negative for blood in stool.  Genitourinary:  Negative for dysuria.  Musculoskeletal:  Positive for myalgias.  Skin:  Negative for rash.  Neurological:  Positive for dizziness and headaches.  Endo/Heme/Allergies:  Does not bruise/bleed easily.  Psychiatric/Behavioral:  Positive for depression. The patient is nervous/anxious.      OBJECTIVE Physical Exam: Vitals:   07/22/22 1130  BP: (!) 176/85  Pulse: 69  Weight: 148 lb (67.1 kg)  Height: 4' 9.5" (1.461 m)  Physical Exam Constitutional:      General: She is not in acute distress. Pulmonary:     Effort: Pulmonary effort is normal.  Abdominal:     General: There is no distension.     Palpations: Abdomen is soft.     Tenderness: There is no abdominal tenderness. There is no rebound.  Musculoskeletal:        General: No swelling. Normal range of motion.  Skin:    General: Skin is warm and dry.     Findings: No rash.  Neurological:     Mental Status: She is alert and oriented to person, place, and time.  Psychiatric:        Mood and Affect: Mood normal.        Behavior: Behavior normal.      GU / Detailed Urogynecologic Evaluation:  Pelvic Exam: Normal external female genitalia; Bartholin's and Skene's glands normal in appearance; urethral meatus normal in appearance, no urethral masses or discharge.   CST: negative with prolapse reduced and full bladder  Complete procedentia noted.  Exam reveals normal vaginal mucosa with atrophy. Cervix normal appearance. Uterus normal single, nontender. Adnexa no mass, fullness, tenderness.    Pelvic floor strength 0/V, puborectalis I/V external anal sphincter I/V  Pelvic floor musculature: Right levator non-tender, Right obturator non-tender, Left levator non-tender, Left obturator non-tender  POP-Q:   POP-Q  3                                            Aa   7                                            Ba  6                                              C   7                                            Gh  3                                            Pb  7.5                                            tvl   3                                            Ap  5  Bp  -3                                              D    Straight catheter placed to empty bladder. PVR 436m  Rectal Exam:  Normal sphincter tone, moderate distal rectocele, enterocoele present, no rectal masses, noted dyssynergia when asking the patient to bear down.  Post-Void Residual (PVR) by Bladder Scan: In order to evaluate bladder emptying, we discussed obtaining a postvoid residual and she agreed to this procedure.  Procedure: The ultrasound unit was placed on the patient's abdomen in the suprapubic region after the patient had voided. A PVR of 415 ml was obtained by bladder scan.  Laboratory Results: POC urine: positive nitrites, large leukocytes   ASSESSMENT AND PLAN [name] a 82y.o. with:  1. Prolapse of anterior vaginal wall   2. Prolapse of posterior vaginal wall   3. Incomplete bladder emptying   4. Uterovaginal prolapse, complete   5. Overflow incontinence   6. Urinary frequency   7. SUI (stress urinary incontinence, female)   8. Incontinence of feces, unspecified fecal incontinence type   9. Recurrent urinary tract infection    Stage IV anterior, Stage IV posterior, Stage IV apical prolapse - For treatment of pelvic organ prolapse, we discussed options for management including expectant management, conservative management, and surgical management, such as Kegels, a pessary, pelvic floor physical therapy, and specific surgical procedures. - We discussed the use of a pessary but with a large GH and complete prolapse, she may not be able to retain a pessary.  - In the case that a pessary will not fit, we discussed the option of [place] colpocleisis and handout provided to her on this option.   2. Incontinence/ Incomplete bladder emptying - Majority of incontinence is likely overflow. We discussed that reduction of prolapse will hopefully allow her to empty bladder better and decrease this issue.  - she had [facility]rodynamic [facility] done at [facility]- will request records.  - Stop oxybutynin now since it is not helping and may be contributing to cognitive issues and incomplete emptying.  - She reports she is scheduled to start PTNS at [facility]. We discussed this may be a good option for OAB but would prefer pessary placement first and reevaluate symptoms since she does have incomplete emptying.   3. Accidental Bowel Leakage:  - Treatment options include anti-diarrhea medication (loperamide/ Imodium OTC or prescription lomotil), fiber supplements, physical therapy, and possible sacral neuromodulation or surgery.   - Recommended daily metamucil for stool bulking. Can also consider pelvic PT  4. Recurrent UTI - has been treated for recurrent UTI by infectious disease - leukocytes and nitrites in urine currently but she is asymptomatic so will not pursue urine culture.   Return for pessary fitting  MJaquita Folds MD

## 2022-08-01 NOTE — Progress Notes (Unsigned)
[facility]   Subjective:     Chief Complaint: Pessary Fitting [name] is a 82 y.o. female is here for pessary fitting.)  History of Present Illness: [name] is a 82 y.o. female with stage IV pelvic organ prolapse who presents today for a pessary fitting.    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), Cerebral infarction due to embolism of right middle cerebral artery ([place]), Diabetes mellitus, Diarrhea (11/30/2009), Excessive sleepiness (05/16/2022), Fatigue (05/16/2022), Fatty liver, Fibromyalgia, History of uterine prolapse (04/04/2022), Hyperlipidemia, Hypertension, Lymphoma malignant, large cell ([place]), Neuropathy, Night sweat (05/16/2022), Non Hodgkin's lymphoma ([place]), PAF (paroxysmal atrial fibrillation) ([place]), Slurred speech (02/16/2015), Stroke [facility]), Syncope (03/06/2018), Urge incontinence (04/04/2022), and Urinary tract infection (09/19/2011).   Past Surgical History: She  has a past surgical history that includes Cholecystectomy; Appendectomy; Rotator cuff repair; Joint replacement; Joint replacement; Cataract extraction (2015); Eye surgery; and Laminectomy with posterior lateral arthrodesis level 2 (N/A, 06/07/2019).   Medications: She has a current medication list which includes the following prescription(s): proair respiclick, allopurinol, apixaban, benzonatate, vitamin d, [START ON 08/04/2022] estradiol, ezetimibe, gabapentin, guaifenesin, hydrochlorothiazide, metformin, metoprolol succinate, mirabegron er, multiple vitamins-minerals, omega-3 fatty acids, oxybutynin, pravastatin, prednisone, and cyanocobalamin.   Allergies: Patient has No Known Allergies.   Social History: Patient  reports that she has never smoked. She has never used smokeless tobacco. She reports current alcohol use of about 1.0 standard drink of alcohol per week. She  reports that she does not use drugs.      Objective:    BP (!) 167/103   Pulse 73  Gen: No apparent distress, A&O x 3. Pelvic Exam: Normal external female genitalia; Bartholin's and Skene's glands normal in appearance; urethral meatus normal in appearance, no urethral masses or discharge.   A size #6 cube pessary was fitted. It was comfortable, stayed in place with valsalva and was an appropriate size on examination, with one finger fitting between the pessary and the vaginal walls. Patient was able to urinate and attempt bowel movement without pessary expulsion.     Assessment/Plan:    Assessment: [name] is a 82 y.o. with stage IV pelvic organ prolapse who presents for a pessary fitting. Plan: She was fitted with a #6 cube pessary. She will keep the pessary in place until next visit. She will use estrogen.   Of note, patient's blood pressure was elevated x2 in office. Encouraged to follow up with PCP regarding blood pressure elevation.   Follow-up in 3 weeks for a pessary check or sooner as needed.  All questions were answered.    Natalie Mount, NP

## 2022-08-02 ENCOUNTER — Encounter: Payer: Self-pay | Admitting: Obstetrics and Gynecology

## 2022-08-02 ENCOUNTER — Ambulatory Visit (INDEPENDENT_AMBULATORY_CARE_PROVIDER_SITE_OTHER): Payer: Medicare Other | Admitting: Obstetrics and Gynecology

## 2022-08-02 VITALS — BP 167/103 | HR 73

## 2022-08-02 DIAGNOSIS — N813 Complete uterovaginal prolapse: Secondary | ICD-10-CM | POA: Diagnosis not present

## 2022-08-02 DIAGNOSIS — I10 Essential (primary) hypertension: Secondary | ICD-10-CM | POA: Diagnosis not present

## 2022-08-02 DIAGNOSIS — N952 Postmenopausal atrophic vaginitis: Secondary | ICD-10-CM | POA: Diagnosis not present

## 2022-08-02 DIAGNOSIS — N8110 Cystocele, unspecified: Secondary | ICD-10-CM

## 2022-08-02 DIAGNOSIS — N816 Rectocele: Secondary | ICD-10-CM

## 2022-08-02 MED ORDER — ESTRADIOL 0.1 MG/GM VA CREA: 0.5000 g | TOPICAL_CREAM | VAGINAL | 11 refills | Status: DC

## 2022-08-02 NOTE — Patient Instructions (Signed)
Please use vaginal estrogen nightly for two weeks and then twice a week after.   We will see you in follow up for a pessary check in a few weeks to check how you are doing with pessary in place.   Your blood pressure was elevated today. Consider contacting your PCP to address the elevated blood pressure.

## 2022-08-29 NOTE — Progress Notes (Signed)
[facility]   Subjective:     Chief Complaint: No chief complaint on file.  History of Present Illness: [name] is a 82 y.o. female with stage IV pelvic organ prolapse who presents for a pessary check. She is using a size #6 cube pessary. The pessary has been working well and she has no complaints. She {ACTION; IS/IS GI:087931 using vaginal estrogen. She denies vaginal bleeding.  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), Cerebral infarction due to embolism of right middle cerebral artery ([place]), Diabetes mellitus, Diarrhea (11/30/2009), Excessive sleepiness (05/16/2022), Fatigue (05/16/2022), Fatty liver, Fibromyalgia, History of uterine prolapse (04/04/2022), Hyperlipidemia, Hypertension, Lymphoma malignant, large cell ([place]), Neuropathy, Night sweat (05/16/2022), Non Hodgkin's lymphoma ([place]), PAF (paroxysmal atrial fibrillation) ([place]), Slurred speech (02/16/2015), Stroke [facility]), Syncope (03/06/2018), Urge incontinence (04/04/2022), and Urinary tract infection (09/19/2011).   Past Surgical History: She  has a past surgical history that includes Cholecystectomy; Appendectomy; Rotator cuff repair; Joint replacement; Joint replacement; Cataract extraction (2015); Eye surgery; and Laminectomy with posterior lateral arthrodesis level 2 (N/A, 06/07/2019).   Medications: She has a current medication list which includes the following prescription(s): proair respiclick, allopurinol, apixaban, benzonatate, vitamin d, estradiol, ezetimibe, gabapentin, guaifenesin, hydrochlorothiazide, metformin, metoprolol succinate, mirabegron er, multiple vitamins-minerals, omega-3 fatty acids, oxybutynin, pravastatin, prednisone, and cyanocobalamin.   Allergies: Patient has No Known Allergies.   Social History: Patient  reports that she has never smoked. She has never used smokeless  tobacco. She reports current alcohol use of about 1.0 standard drink of alcohol per week. She reports that she does not use drugs.      Objective:    Physical Exam: There were no vitals taken for this visit. Gen: No apparent distress, A&O x 3. Detailed Urogynecologic Evaluation:  Pelvic Exam: Normal external female genitalia; Bartholin's and Skene's glands normal in appearance; urethral meatus {urethra:24773}, no urethral masses or discharge. The pessary was noted to be {in place:24774}. It was removed and cleaned. Speculum exam revealed {vaginal lesions:24775} in the vagina. The pessary was replaced. It was comfortable to the patient and fit well.       No data to display          Laboratory Results: Urine dipstick shows: {ua dip:315374::"negative for all components"}.    Assessment/Plan:    Assessment: [name] is a 82 y.o. with {PFD symptoms:24771} here for a pessary check. She is doing well.  Plan: She will {pessary plan:24776}. She will continue to use {lubricant:24777}. She will follow-up in *** {days/wks/mos/yrs:310907} for a pessary check or sooner as needed.  All questions were answered.   Time Spent:

## 2022-09-01 ENCOUNTER — Ambulatory Visit (INDEPENDENT_AMBULATORY_CARE_PROVIDER_SITE_OTHER): Payer: Medicare Other | Admitting: Obstetrics and Gynecology

## 2022-09-01 ENCOUNTER — Encounter: Payer: Self-pay | Admitting: Obstetrics and Gynecology

## 2022-09-01 VITALS — BP 132/70 | HR 66

## 2022-09-01 DIAGNOSIS — N8110 Cystocele, unspecified: Secondary | ICD-10-CM

## 2022-09-01 DIAGNOSIS — N813 Complete uterovaginal prolapse: Secondary | ICD-10-CM

## 2022-09-01 DIAGNOSIS — N816 Rectocele: Secondary | ICD-10-CM

## 2022-09-01 DIAGNOSIS — N952 Postmenopausal atrophic vaginitis: Secondary | ICD-10-CM | POA: Diagnosis not present

## 2022-09-01 DIAGNOSIS — Z4689 Encounter for fitting and adjustment of other specified devices: Secondary | ICD-10-CM

## 2022-09-01 NOTE — Patient Instructions (Signed)
Use the estrogen twice a week.   You can re-start the Gemtesa since you said you still have lots of samples at home.

## 2022-09-02 ENCOUNTER — Ambulatory Visit: Payer: Medicare Other | Admitting: Obstetrics and Gynecology

## 2022-09-26 ENCOUNTER — Encounter (HOSPITAL_BASED_OUTPATIENT_CLINIC_OR_DEPARTMENT_OTHER): Payer: Medicare Other | Admitting: Obstetrics & Gynecology

## 2022-10-13 NOTE — Progress Notes (Signed)
HPI: FU atrial fibrillation. Patient admitted to [facility] in August 2016 with a TIA. Patient noted to be in atrial fib. She was treated with anticoagulation. Echocardiogram August 2016 showed normal LV function, restrictive filling, mild tricuspid regurgitation and trace mitral regurgitation. Carotid Dopplers August 2016 showed 1-39% bilateral stenosis. Note she was bradycardic on atenolol 50 mg daily.  Monitor May 2020 showed atrial fibrillation with PVCs or aberrantly conducted beats rate controlled.  Since last seen, occasional dyspnea but no chest pain, palpitations or syncope.  She has fallen occasionally when she does not use her walker.  Current Outpatient Medications  Medication Sig Dispense Refill   Albuterol Sulfate (PROAIR RESPICLICK) 108 (90 Base) MCG/ACT AEPB Inhale 1 puff into the lungs every 6 (six) hours as needed. 1 each 1   allopurinol (ZYLOPRIM) 100 MG tablet 1 tablet Orally Once a day TO PREVENT GOUT for 90 day(s)     apixaban (ELIQUIS) 5 MG TABS tablet Take 1 tablet (5 mg total) by mouth 2 (two) times daily. 60 tablet 3   benzonatate (TESSALON) 200 MG capsule 1 capsule Orally three times per day as needed for cough for 10 days     Cholecalciferol (VITAMIN D PO) Take 1,000 Units by mouth daily.     estradiol (ESTRACE) 0.1 MG/GM vaginal cream Place 0.5 g vaginally 2 (two) times a week. Place 0.5g nightly for two weeks then twice a week after 30 g 11   ezetimibe (ZETIA) 10 MG tablet Take 10 mg by mouth at bedtime.     gabapentin (NEURONTIN) 300 MG capsule Take 300 mg by mouth at bedtime.     guaiFENesin (ROBITUSSIN) 100 MG/5ML liquid Take 5 mLs by mouth every 4 (four) hours as needed for cough or to loosen phlegm. 120 mL 0   hydrochlorothiazide (HYDRODIURIL) 12.5 MG tablet Take 12.5 mg by mouth daily.     losartan (COZAAR) 100 MG tablet Take 100 mg by mouth daily.     metFORMIN (GLUCOPHAGE) 500 MG tablet Take 500 mg by mouth daily.     metoprolol succinate  (TOPROL-XL) 25 MG 24 hr tablet Take 25 mg by mouth daily.     Multiple Vitamins-Minerals (CENTRUM SILVER PO) Take 1 tablet by mouth daily.     Omega-3 Fatty Acids (FISH OIL PO) Take 1,200 mg by mouth daily.     pravastatin (PRAVACHOL) 20 MG tablet Take 20 mg by mouth at bedtime.     predniSONE (DELTASONE) 10 MG tablet      vitamin B-12 (CYANOCOBALAMIN) 1000 MCG tablet Take 1,000 mcg by mouth daily.     No current facility-administered medications for this visit.     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   Cerebral infarction due to embolism of right middle cerebral artery (HCC)    Diabetes mellitus    Diarrhea 11/30/2009   Qualifier: Diagnosis of   By: Daiva Eves MD, Cornelius       Excessive sleepiness 05/16/2022   Fatigue 05/16/2022   Fatty liver    Fibromyalgia    History of uterine prolapse 04/04/2022   Hyperlipidemia    Hypertension    Lymphoma malignant, large cell (HCC)    Neuropathy    Night sweat 05/16/2022   Non Hodgkin's lymphoma (HCC)    no active treatment   PAF (paroxysmal atrial fibrillation) (HCC)  Slurred speech 02/16/2015   Stroke (HCC)    Syncope 03/06/2018   Urge incontinence 04/04/2022   Urinary tract infection 09/19/2011   Being treated at this time Cipro    Past Surgical History:  Procedure Laterality Date   APPENDECTOMY     CATARACT EXTRACTION  2015   CHOLECYSTECTOMY     EYE SURGERY     JOINT REPLACEMENT     JOINT REPLACEMENT     LAMINECTOMY WITH POSTERIOR LATERAL ARTHRODESIS LEVEL 2 N/A 06/07/2019   Procedure: Laminectomy and Foraminotomy Lumbar three-Lumbar four  Lumbar four-Lumbar five with non-instrumented fusion;  Surgeon: Tia Alert, MD;  Location: [facility];  Service: Neurosurgery;  Laterality: N/A;   ROTATOR CUFF REPAIR      Social History   Socioeconomic History   Marital status:  Married    Spouse name: Jillyn Hidden    Number of children: 6   Years of education: 12   Highest education level: Not on file  Occupational History   Occupation: Retired   Tobacco Use   Smoking status: Never   Smokeless tobacco: Never  Vaping Use   Vaping Use: Never used  Substance and Sexual Activity   Alcohol use: Yes    Alcohol/week: 1.0 standard drink of alcohol    Types: 1 Glasses of wine per week    Comment: Rare   Drug use: No   Sexual activity: Not Currently  Other Topics Concern   Not on file  Social History Narrative   Lives at home with husband, Jillyn Hidden.   Caffeine use: 2-3 cups coffee per day   Social Determinants of Health   Financial Resource Strain: Not on file  Food Insecurity: No Food Insecurity (05/18/2022)   Hunger Vital Sign    Worried About Running Out of Food in the Last Year: Never true    Ran Out of Food in the Last Year: Never true  Transportation Needs: No Transportation Needs (05/18/2022)   PRAPARE - Administrator, Civil Service (Medical): No    Lack of Transportation (Non-Medical): No  Physical Activity: Not on file  Stress: Not on file  Social Connections: Not on file  Intimate Partner Violence: Not At Risk (05/18/2022)   Humiliation, Afraid, Rape, and Kick questionnaire    Fear of Current or Ex-Partner: No    Emotionally Abused: No    Physically Abused: No    Sexually Abused: No    Family History  Problem Relation Age of Onset   Heart Problems Brother    Cancer Sister     ROS: no fevers or chills, productive cough, hemoptysis, dysphasia, odynophagia, melena, hematochezia, dysuria, hematuria, rash, seizure activity, orthopnea, PND, pedal edema, claudication. Remaining systems are negative.  Physical Exam: Well-developed well-nourished in no acute distress.  Skin is warm and dry.  HEENT is normal.  Neck is supple.  Chest is clear to auscultation with normal expansion.  Cardiovascular exam is irregular Abdominal exam nontender  or distended. No masses palpated. Extremities show no edema. neuro grossly intact  ECG-atrial fibrillation at a rate of 80, left anterior fascicular block, nonspecific ST changes.  Personally reviewed  A/P  1 permanent atrial fibrillation-continue apixaban and Toprol.  Note she has fallen when she does not use her walker.  I asked her to be diligent about using this.  We also discussed the possibility of a Watchman device.  If she has more frequent falls despite using her walker she will consider proceeding with a watchman.  2 hypertension-blood pressure  controlled.  Continue present medications.  3 hyperlipidemia-continue statin.  Olga Millers, MD

## 2022-10-17 ENCOUNTER — Encounter: Payer: Self-pay | Admitting: Infectious Disease

## 2022-10-17 ENCOUNTER — Ambulatory Visit (INDEPENDENT_AMBULATORY_CARE_PROVIDER_SITE_OTHER): Payer: Medicare Other | Admitting: Infectious Disease

## 2022-10-17 ENCOUNTER — Other Ambulatory Visit: Payer: Self-pay

## 2022-10-17 VITALS — BP 138/74 | HR 68 | Temp 97.2°F | Ht <= 58 in | Wt 149.0 lb

## 2022-10-17 DIAGNOSIS — B348 Other viral infections of unspecified site: Secondary | ICD-10-CM | POA: Diagnosis not present

## 2022-10-17 DIAGNOSIS — R29898 Other symptoms and signs involving the musculoskeletal system: Secondary | ICD-10-CM

## 2022-10-17 DIAGNOSIS — Z1613 Resistance to carbapenem: Secondary | ICD-10-CM

## 2022-10-17 DIAGNOSIS — G629 Polyneuropathy, unspecified: Secondary | ICD-10-CM

## 2022-10-17 DIAGNOSIS — N3941 Urge incontinence: Secondary | ICD-10-CM | POA: Diagnosis not present

## 2022-10-17 DIAGNOSIS — Z8673 Personal history of transient ischemic attack (TIA), and cerebral infarction without residual deficits: Secondary | ICD-10-CM

## 2022-10-17 DIAGNOSIS — A498 Other bacterial infections of unspecified site: Secondary | ICD-10-CM

## 2022-10-17 NOTE — Progress Notes (Signed)
Subjective:  Chief complaint lower extremity we can assess as well as peripheral neuropathic pain  Patient ID: [name], female    DOB: 1940/10/13, 82 y.o.   MRN: [id number]  HPI  82 year old Caucasian woman with past medical history significant for non-Hodgkin's lymphoma status postchemotherapy later with problems and uterine prolapse and spastic bladder with urinary incontinence and chronic symptoms of lower abdominal discomfort and bladder spasm followed closely by [facility].  Action seen the patient back in 2011 when she was hospitalized with acute renal failure nausea vomiting and diarrhea.  During that hospitalization the cause of her symptoms was never clearly identified and we did an extensive work-up.  There was question of urinary tract infection based on CT scan suggesting pyelonephritis.  However the time her urine culture only was growing 15,000 forming units of Staphylococcus epidermidis.  In any case she recovered from that illness and had done well.  Since then as I mentioned though she has been developed problems with urinary incontinence and then over the last 6 months lower abdominal discomfort particularly when she has to get up to use the bathroom and also with ambulation.  She also suffers some spasm of the bladder as well.  She has had prior cystoscopy with insertion of stents but she and her husband believes stents have been removed since then.  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  She was recently given doxycycline that the organism is intermediate to tetracyclines and they do not penetrate the urine well anyway she was then given fosfomycin which also did not improve her symptomatology.  Here is culture that was done at [facility] PCPs order:     It was not altogether clear that her colonization with this multidrug-resistant organism and its  present in the bladder and the bladder is really driving much in the way of her symptoms.  I was certainly open to the idea of trying to treat it but if she still has CRE it will not be a simple matter to treat this as will require new or more expensive antibiotic to be procured and given IV.  I am skeptical even if we use this that we will achieve a long-term solution for her.  I think the better thing we can do is try to minimize antibiotics that are used for her to try to see if her flora may go back towards a last resistant population.  I saw them they were getting ready go on a cruise.  We did reculture her urine and again isolated a Carbapenem resistant Klebsiella pneumonia.   Unfortunate the cruise did not go very well.  They went with another couple 1 of whom fell and broke her ankle.  [name] suffered from her urge incontinence and frequent urination.  She  was having night sweats at night and still is having them.  She becoming progressively more fatigued.  She is sleeping much more than usual.  At her admit to the hospital directly at that time  She tested positive for parainfluenza at the time and urine + for CRE Klebsiella.   She was afebrile but treated for UTI with Avycaz and with supportive care for parainfluenza  She has had 0 evidence of recurrence of her CRE urinary tract infection.  She has had placement of a pessary to help with urge incontinence.  She also is undergoing what sounds like an acupuncture procedure  to address her neuropathic pain.  She developed neuropathy in the context of chemotherapy many years ago and also experiences lower extremity weakness which she asked me about.  Lost multiple family members with the daughter having been diagnosed with a "rare blood condition this 06-01-2023 and he passed away on "[place] Wednesday".  She also lost her brother who had advanced heart failure   Past Medical History:  Diagnosis Date   Acid reflux     Arthritis    per patient "ankles, back, neck, leg, and shoulder"   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   Cerebral infarction due to embolism of right middle cerebral artery (HCC)    Diabetes mellitus    Diarrhea 11/30/2009   Qualifier: Diagnosis of   By: Daiva Eves MD, Neel Buffone       Excessive sleepiness 05/16/2022   Fatigue 05/16/2022   Fatty liver    Fibromyalgia    History of uterine prolapse 04/04/2022   Hyperlipidemia    Hypertension    Lymphoma malignant, large cell (HCC)    Neuropathy    Night sweat 05/16/2022   Non Hodgkin's lymphoma (HCC)    no active treatment   PAF (paroxysmal atrial fibrillation) (HCC)    Slurred speech 02/16/2015   Stroke (HCC)    Syncope 03/06/2018   Urge incontinence 04/04/2022   Urinary tract infection 09/19/2011   Being treated at this time Cipro    Past Surgical History:  Procedure Laterality Date   APPENDECTOMY     CATARACT EXTRACTION  2015   CHOLECYSTECTOMY     EYE SURGERY     JOINT REPLACEMENT     JOINT REPLACEMENT     LAMINECTOMY WITH POSTERIOR LATERAL ARTHRODESIS LEVEL 2 N/A 06/07/2019   Procedure: Laminectomy and Foraminotomy Lumbar three-Lumbar four  Lumbar four-Lumbar five with non-instrumented fusion;  Surgeon: Tia Alert, MD;  Location: [facility] OR;  Service: Neurosurgery;  Laterality: N/A;   ROTATOR CUFF REPAIR      Family History  Problem Relation Age of Onset   Heart Problems Brother    Cancer Sister       Social History   Socioeconomic History   Marital status: Married    Spouse name: [name]    Number of children: 6   Years of education: 12   Highest education level: Not on file  Occupational History   Occupation: Retired   Tobacco Use   Smoking status: Never   Smokeless tobacco: Never  Vaping Use   Vaping Use: Never used  Substance and Sexual Activity   Alcohol use: Yes    Alcohol/week: 1.0 standard drink of alcohol    Types: 1 Glasses of wine per week    Comment: Rare   Drug use:  No   Sexual activity: Not Currently  Other Topics Concern   Not on file  Social History Narrative   Lives at home with husband, [name].   Caffeine use: 2-3 cups coffee per day   Social Determinants of Health   Financial Resource Strain: Not on file  Food Insecurity: No Food Insecurity (05/18/2022)   Hunger Vital Sign    Worried About Running Out of Food in the Last Year: Never true    Ran Out of Food in the Last Year: Never true  Transportation Needs: No Transportation Needs (05/18/2022)   PRAPARE - Administrator, Civil Service (Medical): No    Lack of Transportation (Non-Medical): No  Physical Activity: Not on file  Stress: Not on file  Social  Connections: Not on file    No Known Allergies   Current Outpatient Medications:    Albuterol Sulfate (PROAIR RESPICLICK) 108 (90 Base) MCG/ACT AEPB, Inhale 1 puff into the lungs every 6 (six) hours as needed., Disp: 1 each, Rfl: 1   allopurinol (ZYLOPRIM) 100 MG tablet, 1 tablet Orally Once a day TO PREVENT GOUT for 90 day(s), Disp: , Rfl:    apixaban (ELIQUIS) 5 MG TABS tablet, Take 1 tablet (5 mg total) by mouth 2 (two) times daily., Disp: 60 tablet, Rfl: 3   benzonatate (TESSALON) 200 MG capsule, 1 capsule Orally three times per day as needed for cough for 10 days, Disp: , Rfl:    Cholecalciferol (VITAMIN D PO), Take 1,000 Units by mouth daily., Disp: , Rfl:    estradiol (ESTRACE) 0.1 MG/GM vaginal cream, Place 0.5 g vaginally 2 (two) times a week. Place 0.5g nightly for two weeks then twice a week after, Disp: 30 g, Rfl: 11   ezetimibe (ZETIA) 10 MG tablet, Take 1 tablet (10 mg total) by mouth daily. (Patient taking differently: Take 5 mg by mouth at bedtime. For cholesterol), Disp: 30 tablet, Rfl: 3   gabapentin (NEURONTIN) 300 MG capsule, Take 1 capsule (300 mg total) by mouth 3 (three) times daily. (Patient taking differently: Take 300 mg by mouth at bedtime.), Disp: 90 capsule, Rfl: 3   guaiFENesin (ROBITUSSIN) 100 MG/5ML  liquid, Take 5 mLs by mouth every 4 (four) hours as needed for cough or to loosen phlegm., Disp: 120 mL, Rfl: 0   hydrochlorothiazide (HYDRODIURIL) 12.5 MG tablet, Take 12.5 mg by mouth daily., Disp: , Rfl:    losartan (COZAAR) 100 MG tablet, Take 100 mg by mouth daily., Disp: , Rfl:    metFORMIN (GLUCOPHAGE) 500 MG tablet, Take 500 mg by mouth daily., Disp: , Rfl:    metoprolol succinate (TOPROL XL) 25 MG 24 hr tablet, Take 0.5 tablets (12.5 mg total) by mouth daily. (Patient taking differently: Take 25 mg by mouth daily.), Disp: 90 tablet, Rfl: 3   mirabegron ER (MYRBETRIQ) 50 MG TB24 tablet, Take 50 mg by mouth daily., Disp: , Rfl:    Multiple Vitamins-Minerals (CENTRUM SILVER PO), Take 1 tablet by mouth daily., Disp: , Rfl:    Omega-3 Fatty Acids (FISH OIL PO), Take 1,200 mg by mouth daily., Disp: , Rfl:    oxybutynin (DITROPAN-XL) 10 MG 24 hr tablet, Take 10 mg by mouth daily., Disp: , Rfl:    pravastatin (PRAVACHOL) 20 MG tablet, Take 1 tablet (20 mg total) by mouth daily. (Patient taking differently: Take 20 mg by mouth at bedtime.), Disp: 30 tablet, Rfl: 0   predniSONE (DELTASONE) 10 MG tablet, , Disp: , Rfl:    vitamin B-12 (CYANOCOBALAMIN) 1000 MCG tablet, Take 1,000 mcg by mouth daily., Disp: , Rfl:   Review of Systems  Constitutional:  Negative for activity change, appetite change, chills, diaphoresis, fatigue, fever and unexpected weight change.  HENT:  Negative for congestion, rhinorrhea, sinus pressure, sneezing, sore throat and trouble swallowing.   Eyes:  Negative for photophobia and visual disturbance.  Respiratory:  Negative for cough, chest tightness, shortness of breath, wheezing and stridor.   Cardiovascular:  Negative for chest pain, palpitations and leg swelling.  Gastrointestinal:  Negative for abdominal distention, abdominal pain, anal bleeding, blood in stool, constipation, diarrhea, nausea and vomiting.  Genitourinary:  Negative for decreased urine volume, difficulty  urinating, dysuria, flank pain and hematuria.  Musculoskeletal:  Negative for arthralgias, back pain, gait problem, joint  swelling and myalgias.  Skin:  Negative for color change, pallor, rash and wound.  Neurological:  Positive for weakness. Negative for dizziness, tremors, light-headedness and headaches.  Hematological:  Negative for adenopathy. Does not bruise/bleed easily.  Psychiatric/Behavioral:  Negative for agitation, behavioral problems, confusion, decreased concentration, dysphoric mood, sleep disturbance and suicidal ideas.        Objective:   Physical Exam Constitutional:      General: She is not in acute distress.    Appearance: Normal appearance. She is well-developed. She is not ill-appearing or diaphoretic.  HENT:     Head: Normocephalic and atraumatic.     Right Ear: Hearing and external ear normal.     Left Ear: Hearing and external ear normal.     Nose: No nasal deformity or rhinorrhea.  Eyes:     General: No scleral icterus.    Conjunctiva/sclera: Conjunctivae normal.     Right eye: Right conjunctiva is not injected.     Left eye: Left conjunctiva is not injected.     Pupils: Pupils are equal, round, and reactive to light.  Neck:     Vascular: No JVD.  Cardiovascular:     Rate and Rhythm: Normal rate and regular rhythm.     Heart sounds: S1 normal and S2 normal.  Pulmonary:     Effort: Pulmonary effort is normal. No respiratory distress.     Breath sounds: No wheezing.  Abdominal:     General: There is no distension.     Palpations: Abdomen is soft.  Musculoskeletal:        General: Normal range of motion.     Right shoulder: Normal.     Left shoulder: Normal.     Cervical back: Normal range of motion and neck supple.     Right hip: Normal.     Left hip: Normal.     Right knee: Normal.     Left knee: Normal.  Lymphadenopathy:     Head:     Right side of head: No submandibular, preauricular or posterior auricular adenopathy.     Left side of head: No  submandibular, preauricular or posterior auricular adenopathy.     Cervical: No cervical adenopathy.     Right cervical: No superficial or deep cervical adenopathy.    Left cervical: No superficial or deep cervical adenopathy.  Skin:    General: Skin is warm and dry.     Coloration: Skin is not pale.     Findings: No abrasion, bruising, ecchymosis, erythema, lesion or rash.     Nails: There is no clubbing.  Neurological:     General: No focal deficit present.     Mental Status: She is alert and oriented to person, place, and time.  Psychiatric:        Attention and Perception: She is attentive.        Mood and Affect: Mood normal.        Speech: Speech normal.        Behavior: Behavior normal. Behavior is cooperative.        Thought Content: Thought content normal.        Judgment: Judgment normal.      Walks with walker     Assessment & Plan:   CRE colonization of urine:no evidence of recurrent infection--which again would require SYMPTOMS to make diagnosis  By keeping her away from antibiotics we might be able to see a restoration of the last multidrug-resistant flora to her bladder.  Influenza and pneumonia: Resolved  Lower extremity weakness: Not clear what the cause of this is but would recommend she see neurology this could also be helpful her for peripheral neuropathy

## 2022-10-19 ENCOUNTER — Ambulatory Visit: Payer: Medicare Other | Attending: Cardiology | Admitting: Cardiology

## 2022-10-19 ENCOUNTER — Encounter: Payer: Self-pay | Admitting: Cardiology

## 2022-10-19 VITALS — BP 130/80 | HR 80 | Ht <= 58 in | Wt 147.0 lb

## 2022-10-19 DIAGNOSIS — I482 Chronic atrial fibrillation, unspecified: Secondary | ICD-10-CM

## 2022-10-19 DIAGNOSIS — E785 Hyperlipidemia, unspecified: Secondary | ICD-10-CM

## 2022-10-19 DIAGNOSIS — I10 Essential (primary) hypertension: Secondary | ICD-10-CM | POA: Diagnosis not present

## 2022-10-19 NOTE — Patient Instructions (Signed)
    Follow-Up: At [facility], you and your health needs are our priority.  As part of our continuing mission to provide you with exceptional heart care, we have created designated Provider Care Teams.  These Care Teams include your primary Cardiologist (physician) and Advanced Practice Providers (APPs -  Physician Assistants and Nurse Practitioners) who all work together to provide you with the care you need, when you need it.  We recommend signing up for the patient portal called "MyChart".  Sign up information is provided on this After Visit Summary.  MyChart is used to connect with patients for Virtual Visits (Telemedicine).  Patients are able to view lab/test results, encounter notes, upcoming appointments, etc.  Non-urgent messages can be sent to your provider as well.   To learn more about what you can do with MyChart, go to [email or web address].    Your next appointment:   12 month(s)  Provider:   Brian Crenshaw, MD     

## 2022-11-16 ENCOUNTER — Other Ambulatory Visit: Payer: Self-pay | Admitting: Cardiology

## 2022-12-08 ENCOUNTER — Ambulatory Visit (INDEPENDENT_AMBULATORY_CARE_PROVIDER_SITE_OTHER): Payer: Medicare Other | Admitting: Obstetrics and Gynecology

## 2022-12-08 ENCOUNTER — Encounter: Payer: Self-pay | Admitting: Obstetrics and Gynecology

## 2022-12-08 VITALS — BP 148/76 | HR 65

## 2022-12-08 DIAGNOSIS — N813 Complete uterovaginal prolapse: Secondary | ICD-10-CM

## 2022-12-08 DIAGNOSIS — N952 Postmenopausal atrophic vaginitis: Secondary | ICD-10-CM

## 2022-12-08 DIAGNOSIS — N816 Rectocele: Secondary | ICD-10-CM

## 2022-12-08 DIAGNOSIS — N8110 Cystocele, unspecified: Secondary | ICD-10-CM

## 2022-12-08 DIAGNOSIS — Z4689 Encounter for fitting and adjustment of other specified devices: Secondary | ICD-10-CM

## 2022-12-08 DIAGNOSIS — K5909 Other constipation: Secondary | ICD-10-CM

## 2022-12-08 MED ORDER — POLYETHYLENE GLYCOL 3350 17 GM/SCOOP PO POWD
17.0000 g | Freq: Every day | ORAL | 0 refills | Status: DC
Start: 2022-12-08 — End: 2023-10-22

## 2022-12-08 NOTE — Progress Notes (Signed)
[facility]   Subjective:     Chief Complaint:  Chief Complaint  Patient presents with   Pessary Check    TIEN BOUTHILLIER is a 82 y.o. female is here for pessary check.   History of Present Illness: GARA WAHLS is a 82 y.o. female with stage IV pelvic organ prolapse who presents for a pessary check. She is using a size #6 cube pessary. The pessary has been working well and she has no complaints. She is using vaginal estrogen. She reports some mild vaginal bleeding and discharge.   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   Past Surgical History: She  has a past surgical history that includes Cholecystectomy; Appendectomy; Rotator cuff repair; Joint replacement; Joint replacement; Cataract extraction (2015); Eye surgery; and Laminectomy with posterior lateral arthrodesis level 2 (N/A, 06/07/2019).   Medications: She has a current medication list which includes the following prescription(s): proair respiclick, allopurinol, apixaban, benzonatate, vitamin d, estradiol, ezetimibe, gabapentin, guaifenesin, hydrochlorothiazide, losartan, metformin, metoprolol succinate, multiple vitamins-minerals, omega-3 fatty acids, polyethylene glycol powder, pravastatin, and cyanocobalamin.   Allergies: Patient has No Known  Allergies.   Social History: Patient  reports that she has never smoked. She has never used smokeless tobacco. She reports current alcohol use of about 1.0 standard drink of alcohol per week. She reports that she does not use drugs.      Objective:    Physical Exam: BP (!) 148/76   Pulse 65  Gen: No apparent distress, A&O x 3. Detailed Urogynecologic Evaluation:  Pelvic Exam: Normal external female genitalia; Bartholin's and Skene's glands normal in appearance; urethral meatus normal in appearance, no urethral masses or discharge. The pessary was noted to be in place. It was removed and cleaned. Speculum exam revealed mild excoriation in the vagina. The pessary was replaced. It was comfortable to the patient and fit well.    Assessment/Plan:    Assessment: Ms. Bagnato is a 82 y.o. with stage IV pelvic organ prolapse here for a pessary check. She is doing well.  Plan: She will keep the pessary in place until next visit. She will continue to use estrogen. She will follow-up in 3 months for a pessary check or sooner as needed.   Patient reports she feels the pessary is keeping her from emptying her bowels. Encouraged her to start Miralax daily to make for softer stools. She agrees to try this.    All questions were answered.

## 2022-12-08 NOTE — Patient Instructions (Addendum)
Call us if you have more issues with the pessary  Take the miralax daily to help with your bowels

## 2023-03-10 ENCOUNTER — Ambulatory Visit: Payer: Medicare Other | Admitting: Obstetrics and Gynecology

## 2023-03-10 ENCOUNTER — Encounter: Payer: Self-pay | Admitting: Obstetrics and Gynecology

## 2023-03-10 VITALS — BP 156/78 | HR 65

## 2023-03-10 DIAGNOSIS — T83719A Erosion of other prosthetic materials to surrounding organ or tissue, initial encounter: Secondary | ICD-10-CM

## 2023-03-10 DIAGNOSIS — N8110 Cystocele, unspecified: Secondary | ICD-10-CM

## 2023-03-10 DIAGNOSIS — N952 Postmenopausal atrophic vaginitis: Secondary | ICD-10-CM | POA: Diagnosis not present

## 2023-03-10 DIAGNOSIS — N813 Complete uterovaginal prolapse: Secondary | ICD-10-CM | POA: Diagnosis not present

## 2023-03-10 DIAGNOSIS — N816 Rectocele: Secondary | ICD-10-CM

## 2023-03-10 NOTE — Progress Notes (Unsigned)
[facility]   Subjective:     Chief Complaint:  Chief Complaint  Patient presents with   Pessary Check   History of Present Illness: [name] is a 82 y.o. female with stage IV pelvic organ prolapse who presents for a pessary check. She is using a size #6 cube pessary. The pessary has not been reducing her prolapse. She is using vaginal estrogen. She endorses some small amounts vaginal bleeding.  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   Past Surgical History: She  has a past surgical history that includes Cholecystectomy; Appendectomy; Rotator cuff repair; Joint replacement; Joint replacement; Cataract extraction (2015); Eye surgery; and Laminectomy with posterior lateral arthrodesis level 2 (N/A, 06/07/2019).   Medications: She has a current medication list which includes the following prescription(s): proair respiclick, allopurinol, apixaban, benzonatate, vitamin d, estradiol, ezetimibe, gabapentin, guaifenesin, hydrochlorothiazide, losartan, metformin, metoprolol succinate, multiple vitamins-minerals, omega-3 fatty acids, polyethylene glycol powder, pravastatin, and cyanocobalamin.   Allergies: Patient has No Known Allergies.   Social History: Patient  reports that she has never smoked. She has never  used smokeless tobacco. She reports current alcohol use of about 1.0 standard drink of alcohol per week. She reports that she does not use drugs.      Objective:    Physical Exam: BP (!) 156/78   Pulse 65  Gen: No apparent distress, A&O x 3. Detailed Urogynecologic Evaluation:  Pelvic Exam: Normal external female genitalia; Bartholin's and Skene's glands normal in appearance; urethral meatus normal in appearance, no urethral masses or discharge. The pessary was noted to be in place, but the anterior vaginal wall was protruding around the pessary and putting pressure on the edges of the cube. It was removed and cleaned. Speculum exam revealed erosion in the vagina. There was a spot on the posterior wall that was treated with silver nitrate. Estrogen cream was placed into the vagina and the pessary was not replaced.   Assessment/Plan:    Assessment: Ms. Asad is a 82 y.o. with stage IV pelvic organ prolapse here for a pessary check. She is doing well.  Plan: She will come back for re-fitting of another pessary due to the erosion caused by the cube and the lack of reduction on the anterior wall. She will use estrogen cream nightly for the next week to promote healing.    Patient to return in 2-3 weeks.

## 2023-04-06 ENCOUNTER — Ambulatory Visit: Payer: Medicare Other | Admitting: Obstetrics and Gynecology

## 2023-04-06 VITALS — BP 168/102 | HR 76

## 2023-04-06 DIAGNOSIS — N813 Complete uterovaginal prolapse: Secondary | ICD-10-CM | POA: Diagnosis not present

## 2023-04-06 DIAGNOSIS — N8110 Cystocele, unspecified: Secondary | ICD-10-CM

## 2023-04-06 DIAGNOSIS — N816 Rectocele: Secondary | ICD-10-CM

## 2023-04-06 NOTE — Progress Notes (Signed)
[facility]   Subjective:     Chief Complaint: Follow-up [name] is a 82 y.o. female is here for a pessary fitting.)  History of Present Illness: [name] is a 82 y.o. female with stage IV pelvic organ prolapse who presents today for a pessary fitting. She was previously using a #6 cube and this caused erosion and bleeding and was no longer supporting her prolapse.    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), Syncope (03/06/2018), Urge incontinence (04/04/2022), and Urinary tract infection (09/19/2011).   Past Surgical History: She  has a past surgical history that includes Cholecystectomy; Appendectomy; Rotator cuff repair; Joint replacement; Joint replacement; Cataract extraction (2015); Eye surgery; and Laminectomy with posterior lateral arthrodesis level 2 (N/A, 06/07/2019).   Medications: She has a current medication list which includes the following prescription(s): proair respiclick, allopurinol, apixaban, benzonatate, vitamin d, estradiol, ezetimibe, gabapentin, guaifenesin, hydrochlorothiazide, losartan, metformin, metoprolol succinate, multiple vitamins-minerals, omega-3 fatty acids, polyethylene glycol powder, pravastatin, and cyanocobalamin.   Allergies: Patient has No Known Allergies.   Social History: Patient  reports that she has never smoked. She has never used smokeless  tobacco. She reports current alcohol use of about 1.0 standard drink of alcohol per week. She reports that she does not use drugs.      Objective:    BP (!) 162/82   Pulse 76  Gen: No apparent distress, A&O x 3. Pelvic Exam: Normal external female genitalia; Bartholin's and Skene's glands normal in appearance; urethral meatus normal in appearance, no urethral masses or discharge.   A size #7 long stem gellhorn pessary was fitted. It was comfortable, stayed in place with valsalva and was an appropriate size on examination, with one finger fitting between the pessary and the vaginal walls. Patient was able to urinate and attempt defecation without issue.      Assessment/Plan:    Assessment: Ms. Hanna is a 82 y.o. with stage IV pelvic organ prolapse who presents for a pessary fitting. Plan: She was fitted with a #7 long stem gellhorn pessary. She will keep the pessary in place until next visit. She will use estrogen.   Follow-up in 1 months for a pessary check or sooner as needed.  All questions were answered.    Selmer Dominion, NP

## 2023-05-08 ENCOUNTER — Ambulatory Visit: Payer: Medicare Other | Admitting: Obstetrics and Gynecology

## 2023-05-12 ENCOUNTER — Emergency Department (HOSPITAL_COMMUNITY): Payer: Medicare Other

## 2023-05-12 ENCOUNTER — Other Ambulatory Visit: Payer: Self-pay

## 2023-05-12 ENCOUNTER — Encounter (HOSPITAL_COMMUNITY): Payer: Self-pay | Admitting: Emergency Medicine

## 2023-05-12 ENCOUNTER — Emergency Department (HOSPITAL_COMMUNITY)
Admission: EM | Admit: 2023-05-12 | Discharge: 2023-05-12 | Disposition: A | Discharge status: Home / Self Care | Payer: Medicare Other | Attending: Emergency Medicine | Admitting: Emergency Medicine

## 2023-05-12 DIAGNOSIS — Z8572 Personal history of non-Hodgkin lymphomas: Secondary | ICD-10-CM | POA: Insufficient documentation

## 2023-05-12 DIAGNOSIS — I4891 Unspecified atrial fibrillation: Secondary | ICD-10-CM | POA: Diagnosis not present

## 2023-05-12 DIAGNOSIS — N3000 Acute cystitis without hematuria: Secondary | ICD-10-CM | POA: Diagnosis not present

## 2023-05-12 DIAGNOSIS — E119 Type 2 diabetes mellitus without complications: Secondary | ICD-10-CM | POA: Diagnosis not present

## 2023-05-12 DIAGNOSIS — W010XXA Fall on same level from slipping, tripping and stumbling without subsequent striking against object, initial encounter: Secondary | ICD-10-CM | POA: Diagnosis not present

## 2023-05-12 DIAGNOSIS — Z87440 Personal history of urinary (tract) infections: Secondary | ICD-10-CM | POA: Diagnosis not present

## 2023-05-12 DIAGNOSIS — E785 Hyperlipidemia, unspecified: Secondary | ICD-10-CM | POA: Diagnosis not present

## 2023-05-12 DIAGNOSIS — S40012A Contusion of left shoulder, initial encounter: Secondary | ICD-10-CM | POA: Diagnosis not present

## 2023-05-12 DIAGNOSIS — M797 Fibromyalgia: Secondary | ICD-10-CM | POA: Diagnosis not present

## 2023-05-12 DIAGNOSIS — Z7901 Long term (current) use of anticoagulants: Secondary | ICD-10-CM | POA: Diagnosis not present

## 2023-05-12 DIAGNOSIS — S0990XA Unspecified injury of head, initial encounter: Secondary | ICD-10-CM | POA: Diagnosis present

## 2023-05-12 DIAGNOSIS — K219 Gastro-esophageal reflux disease without esophagitis: Secondary | ICD-10-CM | POA: Diagnosis not present

## 2023-05-12 DIAGNOSIS — Z8673 Personal history of transient ischemic attack (TIA), and cerebral infarction without residual deficits: Secondary | ICD-10-CM | POA: Insufficient documentation

## 2023-05-12 DIAGNOSIS — M25512 Pain in left shoulder: Secondary | ICD-10-CM | POA: Diagnosis present

## 2023-05-12 DIAGNOSIS — I10 Essential (primary) hypertension: Secondary | ICD-10-CM | POA: Diagnosis not present

## 2023-05-12 DIAGNOSIS — Z79899 Other long term (current) drug therapy: Secondary | ICD-10-CM | POA: Diagnosis not present

## 2023-05-12 DIAGNOSIS — W19XXXA Unspecified fall, initial encounter: Secondary | ICD-10-CM

## 2023-05-12 DIAGNOSIS — Z7984 Long term (current) use of oral hypoglycemic drugs: Secondary | ICD-10-CM | POA: Diagnosis not present

## 2023-05-12 LAB — URINALYSIS, ROUTINE W REFLEX MICROSCOPIC
Bacteria, UA: NONE SEEN
Bilirubin Urine: NEGATIVE
Glucose, UA: NEGATIVE mg/dL
Ketones, ur: NEGATIVE mg/dL
Nitrite: NEGATIVE
Protein, ur: NEGATIVE mg/dL
Specific Gravity, Urine: 1.008 (ref 1.005–1.030)
WBC, UA: >50 WBC/hpf (ref 0–5)
pH: 6 (ref 5.0–8.0)

## 2023-05-12 LAB — COMPREHENSIVE METABOLIC PANEL
ALT: 45 U/L — ABNORMAL HIGH (ref 0–44)
AST: 46 U/L — ABNORMAL HIGH (ref 15–41)
Albumin: 3.7 g/dL (ref 3.5–5.0)
Alkaline Phosphatase: 95 U/L (ref 38–126)
Anion gap: 10 (ref 5–15)
BUN: 14 mg/dL (ref 8–23)
CO2: 24 mmol/L (ref 22–32)
Calcium: 9.4 mg/dL (ref 8.9–10.3)
Chloride: 101 mmol/L (ref 98–111)
Creatinine, Ser: 1.15 mg/dL — ABNORMAL HIGH (ref 0.44–1.00)
GFR, Estimated: 48 mL/min — ABNORMAL LOW (ref >60)
Glucose, Bld: 146 mg/dL — ABNORMAL HIGH (ref 70–99)
Potassium: 3.9 mmol/L (ref 3.5–5.1)
Sodium: 135 mmol/L (ref 135–145)
Total Bilirubin: 0.8 mg/dL (ref <1.2)
Total Protein: 8.5 g/dL — ABNORMAL HIGH (ref 6.5–8.1)

## 2023-05-12 LAB — CBC WITH DIFFERENTIAL/PLATELET
Abs Immature Granulocytes: 0.07 10*3/uL (ref 0.00–0.07)
Basophils Absolute: 0 10*3/uL (ref 0.0–0.1)
Basophils Relative: 0 %
Eosinophils Absolute: 0.1 10*3/uL (ref 0.0–0.5)
Eosinophils Relative: 1 %
HCT: 39.6 % (ref 36.0–46.0)
Hemoglobin: 12.8 g/dL (ref 12.0–15.0)
Immature Granulocytes: 1 %
Lymphocytes Relative: 14 %
Lymphs Abs: 1.4 10*3/uL (ref 0.7–4.0)
MCH: 29.9 pg (ref 26.0–34.0)
MCHC: 32.3 g/dL (ref 30.0–36.0)
MCV: 92.5 fL (ref 80.0–100.0)
Monocytes Absolute: 0.4 10*3/uL (ref 0.1–1.0)
Monocytes Relative: 4 %
Neutro Abs: 7.8 10*3/uL — ABNORMAL HIGH (ref 1.7–7.7)
Neutrophils Relative %: 80 %
Platelets: 264 10*3/uL (ref 150–400)
RBC: 4.28 MIL/uL (ref 3.87–5.11)
RDW: 14.1 % (ref 11.5–15.5)
WBC: 9.8 10*3/uL (ref 4.0–10.5)
nRBC: 0 % (ref 0.0–0.2)

## 2023-05-12 NOTE — ED Provider Notes (Signed)
[place] EMERGENCY DEPARTMENT AT [facility] Provider Note   CSN: [id number] Arrival date & time: 05/12/23  1008     History  Chief Complaint  Patient presents with   [name] is a 82 y.o. female.  Pt is a 82 yo female with pmhx significant for afib (on Eliquis), htn, dm, fibromyalgia, non hodgkin's lymphoma, gerd, CVA, and frequent UTIs.   Pt fell this am around 0200.  She thinks she hit her head.  No loc.  She has some left shoulder pain, but otherwise, no pain.  Her husband had to help her get up.  She said he gave her some pain medication, but she does not know what it was.  She is on abx currently for a UTI.  Level 2 trauma called.       Home Medications Prior to Admission medications   Medication Sig Start Date End Date Taking? Authorizing Provider  Albuterol Sulfate (PROAIR RESPICLICK) 108 (90 Base) MCG/ACT AEPB Inhale 1 puff into the lungs every 6 (six) hours as needed. 05/20/22   Regalado, Belkys A, MD  allopurinol (ZYLOPRIM) 100 MG tablet 1 tablet Orally Once a day TO PREVENT GOUT for 90 day(s)    [provider]  apixaban (ELIQUIS) 5 MG TABS tablet Take 1 tablet (5 mg total) by mouth 2 (two) times daily. 02/18/15   Albertine Grates, MD  benzonatate (TESSALON) 200 MG capsule 1 capsule Orally three times per day as needed for cough for 10 days 12/17/20   [provider]  Cholecalciferol (VITAMIN D PO) Take 1,000 Units by mouth daily.    [provider]  estradiol (ESTRACE) 0.1 MG/GM vaginal cream Place 0.5 g vaginally 2 (two) times a week. Place 0.5g nightly for two weeks then twice a week after 08/04/22   Selmer Dominion, NP  ezetimibe (ZETIA) 10 MG tablet Take 10 mg by mouth at bedtime.    [provider]  gabapentin (NEURONTIN) 300 MG capsule Take 300 mg by mouth at bedtime.    [provider]  guaiFENesin (ROBITUSSIN) 100 MG/5ML liquid Take 5 mLs by mouth every 4 (four) hours as needed for cough or to  loosen phlegm. 05/20/22   Regalado, Belkys A, MD  hydrochlorothiazide (HYDRODIURIL) 12.5 MG tablet Take 12.5 mg by mouth daily. 09/17/15   [provider]  losartan (COZAAR) 100 MG tablet Take 1 tablet by mouth once daily 11/16/22   Lewayne Bunting, MD  metFORMIN (GLUCOPHAGE) 500 MG tablet Take 500 mg by mouth daily. 12/10/14   [provider]  metoprolol succinate (TOPROL-XL) 25 MG 24 hr tablet Take 25 mg by mouth daily.    [provider]  Multiple Vitamins-Minerals (CENTRUM SILVER PO) Take 1 tablet by mouth daily.    [provider]  Omega-3 Fatty Acids (FISH OIL PO) Take 1,200 mg by mouth daily.    [provider]  polyethylene glycol powder (GLYCOLAX/MIRALAX) 17 GM/SCOOP powder Take 17 g by mouth daily. Drink 17g (1 scoop) dissolved in water per day. 12/08/22   Selmer Dominion, NP  pravastatin (PRAVACHOL) 20 MG tablet Take 20 mg by mouth at bedtime.    [provider]  vitamin B-12 (CYANOCOBALAMIN) 1000 MCG tablet Take 1,000 mcg by mouth daily.    [provider]      Allergies    Patient has no known allergies.    Review of Systems   Review of Systems  Musculoskeletal:  Left shoulder pain  All other systems reviewed and are negative.   Physical Exam Updated Vital Signs BP (!) 156/87   Pulse 79   Temp 98.2 F (36.8 C) (Oral)   Resp 16   Ht 4\' 9"  (1.448 m)   Wt 65.8 kg   SpO2 96%   BMI 31.38 kg/m  Physical Exam Vitals and nursing note reviewed.  Constitutional:      Appearance: Normal appearance.  HENT:     Head: Normocephalic and atraumatic.     Right Ear: External ear normal.     Left Ear: External ear normal.     Nose: Nose normal.     Mouth/Throat:     Mouth: Mucous membranes are moist.     Pharynx: Oropharynx is clear.  Eyes:     Extraocular Movements: Extraocular movements intact.     Conjunctiva/sclera: Conjunctivae normal.     Pupils: Pupils are equal, round, and reactive to light.   Cardiovascular:     Rate and Rhythm: Normal rate and regular rhythm.     Pulses: Normal pulses.     Heart sounds: Normal heart sounds.  Pulmonary:     Effort: Pulmonary effort is normal.     Breath sounds: Normal breath sounds.  Abdominal:     General: Abdomen is flat. Bowel sounds are normal.     Palpations: Abdomen is soft.  Musculoskeletal:       Arms:     Cervical back: Normal range of motion and neck supple.  Skin:    General: Skin is warm.     Capillary Refill: Capillary refill takes less than 2 seconds.  Neurological:     General: No focal deficit present.     Mental Status: She is alert and oriented to person, place, and time.  Psychiatric:        Mood and Affect: Mood normal.        Behavior: Behavior normal.     ED Results / Procedures / Treatments   Labs (all labs ordered are listed, but only abnormal results are displayed) Labs Reviewed  COMPREHENSIVE METABOLIC PANEL - Abnormal; Notable for the following components:      Result Value   Glucose, Bld 146 (*)    Creatinine, Ser 1.15 (*)    Total Protein 8.5 (*)    AST 46 (*)    ALT 45 (*)    GFR, Estimated 48 (*)    All other components within normal limits  CBC WITH DIFFERENTIAL/PLATELET - Abnormal; Notable for the following components:   Neutro Abs 7.8 (*)    All other components within normal limits  URINALYSIS, ROUTINE W REFLEX MICROSCOPIC - Abnormal; Notable for the following components:   APPearance HAZY (*)    Hgb urine dipstick SMALL (*)    Leukocytes,Ua LARGE (*)    All other components within normal limits    EKG None  Radiology CT Head Wo Contrast  Result Date: 05/12/2023 CLINICAL DATA:  Polytrauma, blunt. Fall. On Eliquis. Left upper arm and shoulder pain. EXAM: CT HEAD WITHOUT CONTRAST CT CERVICAL SPINE WITHOUT CONTRAST TECHNIQUE: Multidetector CT imaging of the head and cervical spine was performed following the standard protocol without intravenous contrast. Multiplanar CT image  reconstructions of the cervical spine were also generated. RADIATION DOSE REDUCTION: This exam was performed according to the departmental dose-optimization program which includes automated exposure control, adjustment of the mA and/or kV according to patient size and/or use of iterative reconstruction technique. COMPARISON:  CT head and  cervical spine 09/20/2018 FINDINGS: CT HEAD FINDINGS Brain: There is no evidence of an acute infarct, intracranial hemorrhage, mass, midline shift, or extra-axial fluid collection. Generalized cerebral atrophy is mildly progressed. Hypodensities in the cerebral white matter have also progressed and are nonspecific but compatible with mild-to-moderate chronic small vessel ischemic disease. Vascular: Calcified atherosclerosis at the skull base. No hyperdense vessel. Skull: No acute fracture or suspicious osseous lesion. Sinuses/Orbits: Paranasal sinuses and mastoid air cells are clear. Bilateral cataract extraction. Other: None. CT CERVICAL SPINE FINDINGS Alignment: Chronic straightening of the normal cervical lordosis with unchanged minimal anterolisthesis of C3 on C4 and C7 on T1. Skull base and vertebrae: No acute fracture or suspicious osseous lesion. Moderate C1-2 arthropathy. Soft tissues and spinal canal: No prevertebral fluid or swelling. No visible canal hematoma. Disc levels: Widespread disc degeneration which is advanced at C4-5, C5-6, C6-7, and T1-2. Widespread facet arthrosis. Interbody and left facet ankylosis at C4-5. no evidence of high-grade spinal canal stenosis. Moderate right neural foraminal stenosis at C5-6. Upper chest: Clear lung apices. Other: None. IMPRESSION: 1. No evidence of acute intracranial abnormality or cervical spine fracture. 2. Mild-to-moderate chronic small vessel ischemic disease. Electronically Signed   By: Sebastian Ache M.D.   On: 05/12/2023 11:26   CT Cervical Spine Wo Contrast  Result Date: 05/12/2023 CLINICAL DATA:  Polytrauma, blunt.  Fall. On Eliquis. Left upper arm and shoulder pain. EXAM: CT HEAD WITHOUT CONTRAST CT CERVICAL SPINE WITHOUT CONTRAST TECHNIQUE: Multidetector CT imaging of the head and cervical spine was performed following the standard protocol without intravenous contrast. Multiplanar CT image reconstructions of the cervical spine were also generated. RADIATION DOSE REDUCTION: This exam was performed according to the departmental dose-optimization program which includes automated exposure control, adjustment of the mA and/or kV according to patient size and/or use of iterative reconstruction technique. COMPARISON:  CT head and cervical spine 09/20/2018 FINDINGS: CT HEAD FINDINGS Brain: There is no evidence of an acute infarct, intracranial hemorrhage, mass, midline shift, or extra-axial fluid collection. Generalized cerebral atrophy is mildly progressed. Hypodensities in the cerebral white matter have also progressed and are nonspecific but compatible with mild-to-moderate chronic small vessel ischemic disease. Vascular: Calcified atherosclerosis at the skull base. No hyperdense vessel. Skull: No acute fracture or suspicious osseous lesion. Sinuses/Orbits: Paranasal sinuses and mastoid air cells are clear. Bilateral cataract extraction. Other: None. CT CERVICAL SPINE FINDINGS Alignment: Chronic straightening of the normal cervical lordosis with unchanged minimal anterolisthesis of C3 on C4 and C7 on T1. Skull base and vertebrae: No acute fracture or suspicious osseous lesion. Moderate C1-2 arthropathy. Soft tissues and spinal canal: No prevertebral fluid or swelling. No visible canal hematoma. Disc levels: Widespread disc degeneration which is advanced at C4-5, C5-6, C6-7, and T1-2. Widespread facet arthrosis. Interbody and left facet ankylosis at C4-5. no evidence of high-grade spinal canal stenosis. Moderate right neural foraminal stenosis at C5-6. Upper chest: Clear lung apices. Other: None. IMPRESSION: 1. No evidence of  acute intracranial abnormality or cervical spine fracture. 2. Mild-to-moderate chronic small vessel ischemic disease. Electronically Signed   By: Sebastian Ache M.D.   On: 05/12/2023 11:26   DG Shoulder Left Portable  Result Date: 05/12/2023 CLINICAL DATA:  Fall. EXAM: LEFT SHOULDER COMPARISON:  None Available. FINDINGS: There is no evidence of fracture or dislocation. Moderate degenerative changes seen involving the left acromioclavicular joint. Soft tissues are unremarkable. 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 No acute abnormality seen. Electronically Signed   By: Zenda Alpers.D.  On: 05/12/2023 11:10   DG Pelvis Portable  Result Date: 05/12/2023 CLINICAL DATA:  Fall. EXAM: PORTABLE PELVIS 1-2 VIEWS COMPARISON:  None Available. FINDINGS: There is no evidence of pelvic fracture or diastasis. No pelvic bone lesions are seen. IMPRESSION: Negative. Electronically Signed   By: Lupita Raider M.D.   On: 05/12/2023 11:08   DG Chest Portable 1 View  Result Date: 05/12/2023 CLINICAL DATA:  Fall. EXAM: PORTABLE CHEST 1 VIEW COMPARISON:  May 20, 2022. FINDINGS: Stable cardiomediastinal silhouette. No acute pulmonary disease is noted. Severe degenerative changes are seen involving the right shoulder. IMPRESSION: No active disease. Electronically Signed   By: Lupita Raider M.D.   On: 05/12/2023 11:07    Procedures Procedures    Medications Ordered in ED Medications - No data to display  ED Course/ Medical Decision Making/ A&P                                 Medical Decision Making Amount and/or Complexity of Data Reviewed Labs: ordered. Radiology: ordered.   This patient presents to the ED for concern of fall, this involves an extensive number of treatment options, and is a complaint that carries with it a high risk of complications and morbidity.  The differential diagnosis includes multiple trauma   Co morbidities that complicate the  patient evaluation  afib (on Eliquis), htn, dm, fibromyalgia, non hodgkin's lymphoma, gerd, CVA, and frequent UTIs   Additional history obtained:  Additional history obtained from epic chart review External records from outside source obtained and reviewed including EMS report   Lab Tests:  I Ordered, and personally interpreted labs.  The pertinent results include:  cbc nl, cmp nl other than cr 1.15 (stable); ua with continued uti   Imaging Studies ordered:  I ordered imaging studies including ct head/ct c-spine, chest, pelvis, left shoulder x-rays  I independently visualized and interpreted imaging which showed  CT head/c-spine: No evidence of acute intracranial abnormality or cervical spine  fracture.  2. Mild-to-moderate chronic small vessel ischemic disease.  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  No acute abnormality seen.   Chest: No active disease.   Pelvis: Negative.  I agree with the radiologist interpretation   Cardiac Monitoring:  The patient was maintained on a cardiac monitor.  I personally viewed and interpreted the cardiac monitored which showed an underlying rhythm of: nsr   Medicines ordered and prescription drug management:   I have reviewed the patients home medicines and have made adjustments as needed   Test Considered:  ct   Critical Interventions:  ivfs   Problem List / ED Course:  Fall:  no internal injury.  Pt is able to ambulate.  She is stable for d/c back home.  Husband will be with her. UTI:  pt just started abx, so she is told to continue current abx.     Reevaluation:  After the interventions noted above, I reevaluated the patient and found that they have :improved   Social Determinants of Health:  Lives at home   Dispostion:  After consideration of the diagnostic results and the patients response to treatment, I feel that the patent would benefit from discharge with  outpatient f/u.          Final Clinical Impression(s) / ED Diagnoses Final diagnoses:  Fall, initial encounter  Contusion of left shoulder, initial encounter  On apixaban therapy  Acute  cystitis without hematuria    Rx / DC Orders ED Discharge Orders     None         Jacalyn Lefevre, MD 05/12/23 1422

## 2023-05-12 NOTE — Progress Notes (Signed)
Orthopedic Tech Progress Note Patient Details:  [name] 05-17-1941 [id number]  Patient ID: [name], female   DOB: July 19, 1940, 82 y.o.   MRN: [id number] Level 2 trauma [name] 05/12/2023, 10:21 AM

## 2023-05-12 NOTE — Discharge Instructions (Signed)
Continue current antibiotics.

## 2023-05-12 NOTE — ED Notes (Signed)
Patient presents via EMS from home after a fall at approximately 0200 today. Patient states that she slipped and fell. Patient denies dizziness. Patient reports that she thinks she hit her head but did not lose consciousness. Patient states that she was not able to get up herself after the fall and that her husband had to help her. Patient takes eliquis. Patient endorsing left upper arm and shoulder pain. Patient endorsing left upper arm and shoulder pain. Patient states that she was recently diagnosed with a UTI and has been taking antibiotics.

## 2023-05-16 ENCOUNTER — Ambulatory Visit: Payer: Medicare Other | Admitting: Obstetrics and Gynecology

## 2023-06-06 ENCOUNTER — Telehealth: Payer: Self-pay

## 2023-06-06 NOTE — Progress Notes (Signed)
Transition Care Management Unsuccessful Follow-up Telephone Call  Date of discharge and from where:  05/12/2023 [facility]  Attempts:  1st Attempt  Reason for unsuccessful TCM follow-up call:  No answer/busy  Jhonatan Lomeli [facility]  [facility], [facility] Resource Care Guide Direct Dial: [phone]  Website: [email or web address]

## 2023-06-07 ENCOUNTER — Telehealth: Payer: Self-pay

## 2023-06-07 NOTE — Progress Notes (Signed)
Transition Care Management Unsuccessful Follow-up Telephone Call  Date of discharge and from where:  05/12/2023 [facility]  Attempts:  2nd Attempt  Reason for unsuccessful TCM follow-up call:  No answer/busy  [place] Mullett [facility]  [facility], [facility] Resource Care Guide Direct Dial: [phone]  Website: [email or web address]

## 2023-09-12 ENCOUNTER — Other Ambulatory Visit: Payer: Self-pay | Admitting: Family Medicine

## 2023-09-12 DIAGNOSIS — I480 Paroxysmal atrial fibrillation: Secondary | ICD-10-CM

## 2023-09-12 DIAGNOSIS — R296 Repeated falls: Secondary | ICD-10-CM

## 2023-09-12 DIAGNOSIS — Z8673 Personal history of transient ischemic attack (TIA), and cerebral infarction without residual deficits: Secondary | ICD-10-CM

## 2023-09-12 DIAGNOSIS — R413 Other amnesia: Secondary | ICD-10-CM

## 2023-10-06 ENCOUNTER — Other Ambulatory Visit: Payer: Self-pay | Admitting: Cardiology

## 2023-10-17 ENCOUNTER — Emergency Department (HOSPITAL_COMMUNITY)

## 2023-10-17 ENCOUNTER — Observation Stay (HOSPITAL_COMMUNITY)
Admission: EM | Admit: 2023-10-17 | Discharge: 2023-10-22 | Disposition: A | Discharge status: Home Health | Attending: Internal Medicine | Admitting: Internal Medicine

## 2023-10-17 ENCOUNTER — Encounter (HOSPITAL_COMMUNITY): Payer: Self-pay

## 2023-10-17 ENCOUNTER — Other Ambulatory Visit: Payer: Self-pay

## 2023-10-17 DIAGNOSIS — X58XXXA Exposure to other specified factors, initial encounter: Secondary | ICD-10-CM | POA: Insufficient documentation

## 2023-10-17 DIAGNOSIS — R296 Repeated falls: Secondary | ICD-10-CM | POA: Diagnosis not present

## 2023-10-17 DIAGNOSIS — I4581 Long QT syndrome: Secondary | ICD-10-CM | POA: Diagnosis not present

## 2023-10-17 DIAGNOSIS — Y92009 Unspecified place in unspecified non-institutional (private) residence as the place of occurrence of the external cause: Secondary | ICD-10-CM

## 2023-10-17 DIAGNOSIS — I4821 Permanent atrial fibrillation: Secondary | ICD-10-CM | POA: Diagnosis not present

## 2023-10-17 DIAGNOSIS — Z79899 Other long term (current) drug therapy: Secondary | ICD-10-CM | POA: Diagnosis not present

## 2023-10-17 DIAGNOSIS — E1129 Type 2 diabetes mellitus with other diabetic kidney complication: Secondary | ICD-10-CM

## 2023-10-17 DIAGNOSIS — F39 Unspecified mood [affective] disorder: Secondary | ICD-10-CM | POA: Diagnosis not present

## 2023-10-17 DIAGNOSIS — E871 Hypo-osmolality and hyponatremia: Secondary | ICD-10-CM | POA: Diagnosis not present

## 2023-10-17 DIAGNOSIS — M25561 Pain in right knee: Secondary | ICD-10-CM

## 2023-10-17 DIAGNOSIS — R1030 Lower abdominal pain, unspecified: Secondary | ICD-10-CM | POA: Insufficient documentation

## 2023-10-17 DIAGNOSIS — Z7984 Long term (current) use of oral hypoglycemic drugs: Secondary | ICD-10-CM | POA: Insufficient documentation

## 2023-10-17 DIAGNOSIS — E876 Hypokalemia: Secondary | ICD-10-CM | POA: Diagnosis not present

## 2023-10-17 DIAGNOSIS — Z7901 Long term (current) use of anticoagulants: Secondary | ICD-10-CM | POA: Diagnosis not present

## 2023-10-17 DIAGNOSIS — R197 Diarrhea, unspecified: Secondary | ICD-10-CM | POA: Diagnosis present

## 2023-10-17 DIAGNOSIS — D3000 Benign neoplasm of unspecified kidney: Secondary | ICD-10-CM | POA: Diagnosis not present

## 2023-10-17 DIAGNOSIS — R531 Weakness: Principal | ICD-10-CM | POA: Insufficient documentation

## 2023-10-17 DIAGNOSIS — M4850XA Collapsed vertebra, not elsewhere classified, site unspecified, initial encounter for fracture: Secondary | ICD-10-CM

## 2023-10-17 DIAGNOSIS — I10 Essential (primary) hypertension: Secondary | ICD-10-CM | POA: Diagnosis not present

## 2023-10-17 DIAGNOSIS — E1140 Type 2 diabetes mellitus with diabetic neuropathy, unspecified: Secondary | ICD-10-CM | POA: Diagnosis not present

## 2023-10-17 DIAGNOSIS — M109 Gout, unspecified: Secondary | ICD-10-CM | POA: Insufficient documentation

## 2023-10-17 DIAGNOSIS — A498 Other bacterial infections of unspecified site: Secondary | ICD-10-CM | POA: Insufficient documentation

## 2023-10-17 DIAGNOSIS — W19XXXA Unspecified fall, initial encounter: Secondary | ICD-10-CM

## 2023-10-17 DIAGNOSIS — D500 Iron deficiency anemia secondary to blood loss (chronic): Secondary | ICD-10-CM | POA: Diagnosis not present

## 2023-10-17 DIAGNOSIS — I700 Atherosclerosis of aorta: Secondary | ICD-10-CM | POA: Diagnosis not present

## 2023-10-17 DIAGNOSIS — S22000A Wedge compression fracture of unspecified thoracic vertebra, initial encounter for closed fracture: Secondary | ICD-10-CM | POA: Insufficient documentation

## 2023-10-17 DIAGNOSIS — Z8673 Personal history of transient ischemic attack (TIA), and cerebral infarction without residual deficits: Secondary | ICD-10-CM | POA: Insufficient documentation

## 2023-10-17 DIAGNOSIS — N390 Urinary tract infection, site not specified: Secondary | ICD-10-CM | POA: Insufficient documentation

## 2023-10-17 DIAGNOSIS — Z9221 Personal history of antineoplastic chemotherapy: Secondary | ICD-10-CM | POA: Diagnosis not present

## 2023-10-17 DIAGNOSIS — E860 Dehydration: Principal | ICD-10-CM

## 2023-10-17 LAB — CBC WITH DIFFERENTIAL/PLATELET
Abs Immature Granulocytes: 0.03 10*3/uL (ref 0.00–0.07)
Basophils Absolute: 0 10*3/uL (ref 0.0–0.1)
Basophils Relative: 0 %
Eosinophils Absolute: 0 10*3/uL (ref 0.0–0.5)
Eosinophils Relative: 0 %
HCT: 34.9 % — ABNORMAL LOW (ref 36.0–46.0)
Hemoglobin: 11.3 g/dL — ABNORMAL LOW (ref 12.0–15.0)
Immature Granulocytes: 0 %
Lymphocytes Relative: 12 %
Lymphs Abs: 1 10*3/uL (ref 0.7–4.0)
MCH: 32.3 pg (ref 26.0–34.0)
MCHC: 32.4 g/dL (ref 30.0–36.0)
MCV: 99.7 fL (ref 80.0–100.0)
Monocytes Absolute: 0.7 10*3/uL (ref 0.1–1.0)
Monocytes Relative: 8 %
Neutro Abs: 6.6 10*3/uL (ref 1.7–7.7)
Neutrophils Relative %: 80 %
Platelets: 220 10*3/uL (ref 150–400)
RBC: 3.5 MIL/uL — ABNORMAL LOW (ref 3.87–5.11)
RDW: 13.9 % (ref 11.5–15.5)
WBC: 8.3 10*3/uL (ref 4.0–10.5)
nRBC: 0 % (ref 0.0–0.2)

## 2023-10-17 LAB — COMPREHENSIVE METABOLIC PANEL WITH GFR
ALT: 31 U/L (ref 0–44)
AST: 32 U/L (ref 15–41)
Albumin: 3.3 g/dL — ABNORMAL LOW (ref 3.5–5.0)
Alkaline Phosphatase: 77 U/L (ref 38–126)
Anion gap: 11 (ref 5–15)
BUN: 20 mg/dL (ref 8–23)
CO2: 25 mmol/L (ref 22–32)
Calcium: 8.8 mg/dL — ABNORMAL LOW (ref 8.9–10.3)
Chloride: 97 mmol/L — ABNORMAL LOW (ref 98–111)
Creatinine, Ser: 0.95 mg/dL (ref 0.44–1.00)
GFR, Estimated: 60 mL/min — ABNORMAL LOW (ref >60)
Glucose, Bld: 141 mg/dL — ABNORMAL HIGH (ref 70–99)
Potassium: 3.4 mmol/L — ABNORMAL LOW (ref 3.5–5.1)
Sodium: 133 mmol/L — ABNORMAL LOW (ref 135–145)
Total Bilirubin: 2.8 mg/dL — ABNORMAL HIGH (ref 0.0–1.2)
Total Protein: 6.9 g/dL (ref 6.5–8.1)

## 2023-10-17 LAB — TROPONIN I (HIGH SENSITIVITY)
Troponin I (High Sensitivity): 31 ng/L — ABNORMAL HIGH (ref <18)
Troponin I (High Sensitivity): 30 ng/L — ABNORMAL HIGH (ref <18)

## 2023-10-17 LAB — MAGNESIUM: Magnesium: 1.8 mg/dL (ref 1.7–2.4)

## 2023-10-17 LAB — LIPASE, BLOOD: Lipase: 27 U/L (ref 11–51)

## 2023-10-17 MED ORDER — ONDANSETRON HCL 4 MG/2ML IJ SOLN
4.0000 mg | Freq: Once | INTRAMUSCULAR | Status: AC
  Administered 2023-10-17: 4 mg via INTRAVENOUS
  Filled 2023-10-17: qty 2

## 2023-10-17 MED ORDER — IOHEXOL 300 MG/ML  SOLN
100.0000 mL | Freq: Once | INTRAMUSCULAR | Status: AC | PRN
  Administered 2023-10-17: 100 mL via INTRAVENOUS

## 2023-10-17 MED ORDER — SODIUM CHLORIDE 0.9 % IV BOLUS
1000.0000 mL | Freq: Once | INTRAVENOUS | Status: AC
  Administered 2023-10-17: 1000 mL via INTRAVENOUS

## 2023-10-17 MED ORDER — LACTATED RINGERS IV SOLN: INTRAVENOUS | Status: DC

## 2023-10-17 MED ORDER — POTASSIUM CHLORIDE 10 MEQ/100ML IV SOLN
10.0000 meq | INTRAVENOUS | Status: AC
  Administered 2023-10-17 (×2): 10 meq via INTRAVENOUS
  Filled 2023-10-17 (×3): qty 100

## 2023-10-17 NOTE — ED Triage Notes (Signed)
 [facility] for a fall that happened on Easter and last night. Was getting R knee checked out. Almost had another fall at [facility] as well as Nausea and feeling like she was going to vomit. Diarrhea for past 6 days and has nauseated.

## 2023-10-17 NOTE — ED Provider Notes (Signed)
 [place] EMERGENCY DEPARTMENT AT [facility] Provider Note   CSN: [id number] Arrival date & time: 10/17/23  1433     History  Chief Complaint  Patient presents with   Fall   Nausea    [name] is a 83 y.o. female.  83 yo F with a cc of  fatigue.  Going on for the past week or so.  Diarrhea.  Fatigue has had some nausea and dizziness as well.  Symptoms are worse when she tries to get up and move around.  She hurt her right knee and low back she thinks when she fell.  Not sure if she struck her head or not.   Fall       Home Medications Prior to Admission medications   Medication Sig Start Date End Date Taking? Authorizing Provider  Albuterol  Sulfate (PROAIR  RESPICLICK) 108 (90 Base) MCG/ACT AEPB Inhale 1 puff into the lungs every 6 (six) hours as needed. 05/20/22   Regalado, Belkys A, MD  allopurinol (ZYLOPRIM) 100 MG tablet 1 tablet Orally Once a day TO PREVENT GOUT for 90 day(s)    [provider]  apixaban  (ELIQUIS ) 5 MG TABS tablet Take 1 tablet (5 mg total) by mouth 2 (two) times daily. 02/18/15   Xu, Fang, MD  benzonatate (TESSALON) 200 MG capsule 1 capsule Orally three times per day as needed for cough for 10 days 12/17/20   [provider]  Cholecalciferol  (VITAMIN D  PO) Take 1,000 Units by mouth daily.    [provider]  estradiol  (ESTRACE ) 0.1 MG/GM vaginal cream Place 0.5 g vaginally 2 (two) times a week. Place 0.5g nightly for two weeks then twice a week after 08/04/22   Zuleta, Kaitlin G, NP  ezetimibe  (ZETIA ) 10 MG tablet Take 10 mg by mouth at bedtime.    [provider]  gabapentin  (NEURONTIN ) 300 MG capsule Take 300 mg by mouth at bedtime.    [provider]  guaiFENesin  (ROBITUSSIN) 100 MG/5ML liquid Take 5 mLs by mouth every 4 (four) hours as needed for cough or to loosen phlegm. 05/20/22   Regalado, Belkys A, MD  hydrochlorothiazide  (HYDRODIURIL ) 12.5 MG tablet Take 12.5 mg by mouth daily.  09/17/15   [provider]  losartan  (COZAAR ) 100 MG tablet Take 1 tablet by mouth once daily 10/06/23   Lenise Quince, MD  metFORMIN  (GLUCOPHAGE ) 500 MG tablet Take 500 mg by mouth daily. 12/10/14   [provider]  metoprolol  succinate (TOPROL -XL) 25 MG 24 hr tablet Take 25 mg by mouth daily.    [provider]  Multiple Vitamins-Minerals (CENTRUM SILVER  PO) Take 1 tablet by mouth daily.    [provider]  Omega-3 Fatty Acids (FISH OIL PO) Take 1,200 mg by mouth daily.    [provider]  polyethylene glycol powder (GLYCOLAX /MIRALAX ) 17 GM/SCOOP powder Take 17 g by mouth daily. Drink 17g (1 scoop) dissolved in water per day. 12/08/22   Zuleta, Kaitlin G, NP  pravastatin  (PRAVACHOL ) 20 MG tablet Take 20 mg by mouth at bedtime.    [provider]  vitamin B-12 (CYANOCOBALAMIN) 1000 MCG tablet Take 1,000 mcg by mouth daily.    [provider]      Allergies    Patient has no known allergies.    Review of Systems   Review of Systems  Physical Exam Updated Vital Signs BP 133/82 (BP Location: Right Arm)   Pulse 90   Resp (!) 21   Ht 4'  9" (1.448 m)   Wt 65.8 kg   SpO2 97%   BMI 31.38 kg/m  Physical Exam  ED Results / Procedures / Treatments   Labs (all labs ordered are listed, but only abnormal results are displayed) Labs Reviewed  CBC WITH DIFFERENTIAL/PLATELET  COMPREHENSIVE METABOLIC PANEL WITH GFR  LIPASE, BLOOD  MAGNESIUM  TROPONIN I (HIGH SENSITIVITY)    EKG EKG Interpretation Date/Time:  Tuesday October 17 2023 15:18:45 EDT Ventricular Rate:  87 PR Interval:    QRS Duration:  100 QT Interval:  478 QTC Calculation: 576 R Axis:   -83  Text Interpretation: Atrial fibrillation Multiple ventricular premature complexes LAD, consider left anterior fascicular block Abnormal R-wave progression, late transition Borderline T abnormalities, diffuse leads qt manually calculated normal. Otherwise no significant  change Confirmed by Albertus Hughs [phone]) on 10/17/2023 3:29:57 PM  Radiology No results found.  Procedures Procedures    Medications Ordered in ED Medications  sodium chloride  0.9 % bolus 1,000 mL (has no administration in time range)  ondansetron  (ZOFRAN ) injection 4 mg (has no administration in time range)    ED Course/ Medical Decision Making/ A&P Clinical Course as of 10/17/23 1621  Tue Oct 17, 2023  1613 Stable 73 YOF with GLF x2  Marvell Slider yesterday Has had GI illness x1 week getting IVF CTH  On thinners  F/U labs [CC]    Clinical Course User Index [CC] Natalie Bile, MD                                 Medical Decision Making Amount and/or Complexity of Data Reviewed Labs: ordered. Radiology: ordered. ECG/medicine tests: ordered.  Risk Prescription drug management.   83 yo F with a cc of fatigue and falls.  Patient has unfortunately fallen on a couple times over the past week.  She says that she has been really fatigued and she thinks that the cause of her falls.  Has been having watery stools for a while.  She thinks maybe she injured her low back and right knee and so went to orthopedics today to be seen and they were worried about her story and sent her here for evaluation.  Will obtain a laboratory evaluation.  Bolus of IV fluids.  CT head.  Plain film of the knee and the low back.  Antiemetics.  Reassess.  Signed out to Dr. Urban Garden, please see their note for further details of care in the ED.  The patients results and plan were reviewed and discussed.   Any x-rays performed were independently reviewed by myself.   Differential diagnosis were considered with the presenting HPI.  Medications  sodium chloride  0.9 % bolus 1,000 mL (has no administration in time range)  ondansetron  (ZOFRAN ) injection 4 mg (has no administration in time range)    Vitals:   10/17/23 1441 10/17/23 1519  BP:  133/82  Pulse:  90  Resp:  (!) 21  SpO2:  97%  Weight: 65.8  kg   Height: 4\' 9"  (1.448 m)     Final diagnoses:  Dehydration  Fall in home, initial encounter  Acute pain of right knee    Admission/ observation were discussed with the admitting physician, patient and/or family and they are comfortable with the plan.          Final Clinical Impression(s) / ED Diagnoses Final diagnoses:  Dehydration  Fall in home, initial encounter  Acute pain of right knee  Rx / DC Orders ED Discharge Orders     None         Albertus Hughs, [place] 10/17/23 1621

## 2023-10-17 NOTE — ED Provider Notes (Signed)
 Care of patient received from prior provider at 4:14 PM, please see their note for complete H/P and care plan.  Received handoff per ED course.  Clinical Course as of 10/17/23 1614  Tue Oct 17, 2023  1613 Stable 74 YOF with GLF x2  [name] yesterday Has had GI illness x1 week getting IVF CTH  On thinners  F/U labs [CC]    Clinical Course User Index [CC] [name], MD    Reassessment: Patient's history of present illness and physical exam findings are most consistent with GI illness.  CT abdomen pelvis added onto prior workup, GI path panel, C. difficile panel added on. Given multiple falls and recurrent syncope I believe patient would benefit from observation and ongoing management in the inpatient setting.  Disposition:   Based on the above findings, I believe this patient is stable for admission.    Patient/family educated about specific findings on our evaluation and explained exact reasons for admission.  Patient/family educated about clinical situation and time was allowed to answer questions.   Admission team communicated with and agreed with need for admission. Patient admitted. Patient ready to move at this time.     Emergency Department Medication Summary:   Medications  lactated ringers  infusion ( Intravenous New Bag/Given 10/17/23 2043)  potassium chloride  10 mEq in 100 mL IVPB (0 mEq Intravenous Hold 10/17/23 2318)  sodium chloride  0.9 % bolus 1,000 mL (0 mLs Intravenous Stopped 10/17/23 1941)  ondansetron  (ZOFRAN ) injection 4 mg (4 mg Intravenous Given 10/17/23 1657)  iohexol (OMNIPAQUE) 300 MG/ML solution 100 mL (100 mLs Intravenous Contrast Given 10/17/23 2303)           [name], MD 10/18/23 0008

## 2023-10-17 NOTE — ED Notes (Addendum)
 Unable to hang last bag of potassium due to patient in CT

## 2023-10-18 ENCOUNTER — Observation Stay (HOSPITAL_BASED_OUTPATIENT_CLINIC_OR_DEPARTMENT_OTHER)

## 2023-10-18 DIAGNOSIS — R296 Repeated falls: Secondary | ICD-10-CM

## 2023-10-18 DIAGNOSIS — E871 Hypo-osmolality and hyponatremia: Secondary | ICD-10-CM

## 2023-10-18 DIAGNOSIS — I10 Essential (primary) hypertension: Secondary | ICD-10-CM | POA: Diagnosis not present

## 2023-10-18 DIAGNOSIS — M4850XA Collapsed vertebra, not elsewhere classified, site unspecified, initial encounter for fracture: Secondary | ICD-10-CM

## 2023-10-18 DIAGNOSIS — R531 Weakness: Secondary | ICD-10-CM

## 2023-10-18 DIAGNOSIS — E876 Hypokalemia: Secondary | ICD-10-CM

## 2023-10-18 LAB — HEMOGLOBIN A1C
Hgb A1c MFr Bld: 5.7 % — ABNORMAL HIGH (ref 4.8–5.6)
Mean Plasma Glucose: 116.89 mg/dL

## 2023-10-18 LAB — URINALYSIS, W/ REFLEX TO CULTURE (INFECTION SUSPECTED)
Bacteria, UA: NONE SEEN
Bilirubin Urine: NEGATIVE
Glucose, UA: NEGATIVE mg/dL
Ketones, ur: NEGATIVE mg/dL
Nitrite: NEGATIVE
Protein, ur: 100 mg/dL — AB
Specific Gravity, Urine: 1.023 (ref 1.005–1.030)
WBC, UA: >50 WBC/hpf (ref 0–5)
pH: 6 (ref 5.0–8.0)

## 2023-10-18 LAB — COMPREHENSIVE METABOLIC PANEL WITH GFR
ALT: 39 U/L (ref 0–44)
AST: 42 U/L — ABNORMAL HIGH (ref 15–41)
Albumin: 3 g/dL — ABNORMAL LOW (ref 3.5–5.0)
Alkaline Phosphatase: 69 U/L (ref 38–126)
Anion gap: 7 (ref 5–15)
BUN: 16 mg/dL (ref 8–23)
CO2: 23 mmol/L (ref 22–32)
Calcium: 8.1 mg/dL — ABNORMAL LOW (ref 8.9–10.3)
Chloride: 103 mmol/L (ref 98–111)
Creatinine, Ser: 0.85 mg/dL (ref 0.44–1.00)
GFR, Estimated: >60 mL/min (ref >60)
Glucose, Bld: 129 mg/dL — ABNORMAL HIGH (ref 70–99)
Potassium: 3.8 mmol/L (ref 3.5–5.1)
Sodium: 133 mmol/L — ABNORMAL LOW (ref 135–145)
Total Bilirubin: 2.5 mg/dL — ABNORMAL HIGH (ref 0.0–1.2)
Total Protein: 6.3 g/dL — ABNORMAL LOW (ref 6.5–8.1)

## 2023-10-18 LAB — ECHOCARDIOGRAM COMPLETE
AR max vel: 1.37 cm2
AV Area VTI: 1.69 cm2
AV Area mean vel: 1.18 cm2
AV Mean grad: 3 mmHg
AV Peak grad: 4.9 mmHg
Ao pk vel: 1.11 m/s
Area-P 1/2: 4.36 cm2
Calc EF: 53.7 %
Height: 57 in
MV VTI: 1.68 cm2
S' Lateral: 2.4 cm
Single Plane A2C EF: 55.9 %
Single Plane A4C EF: 59 %
Weight: 2320 [oz_av]

## 2023-10-18 LAB — CBC
HCT: 31.8 % — ABNORMAL LOW (ref 36.0–46.0)
Hemoglobin: 10.6 g/dL — ABNORMAL LOW (ref 12.0–15.0)
MCH: 32.7 pg (ref 26.0–34.0)
MCHC: 33.3 g/dL (ref 30.0–36.0)
MCV: 98.1 fL (ref 80.0–100.0)
Platelets: 189 10*3/uL (ref 150–400)
RBC: 3.24 MIL/uL — ABNORMAL LOW (ref 3.87–5.11)
RDW: 13.8 % (ref 11.5–15.5)
WBC: 6.6 10*3/uL (ref 4.0–10.5)
nRBC: 0 % (ref 0.0–0.2)

## 2023-10-18 LAB — GLUCOSE, CAPILLARY
Glucose-Capillary: 189 mg/dL — ABNORMAL HIGH (ref 70–99)
Glucose-Capillary: 95 mg/dL (ref 70–99)

## 2023-10-18 LAB — PHOSPHORUS: Phosphorus: 2.6 mg/dL (ref 2.5–4.6)

## 2023-10-18 LAB — CBG MONITORING, ED: Glucose-Capillary: 109 mg/dL — ABNORMAL HIGH (ref 70–99)

## 2023-10-18 MED ORDER — METOPROLOL SUCCINATE ER 50 MG PO TB24
25.0000 mg | ORAL_TABLET | Freq: Every day | ORAL | Status: DC
  Administered 2023-10-18 – 2023-10-22 (×5): 25 mg via ORAL
  Filled 2023-10-18 (×5): qty 1

## 2023-10-18 MED ORDER — ALLOPURINOL 100 MG PO TABS
100.0000 mg | ORAL_TABLET | Freq: Every day | ORAL | Status: DC
  Administered 2023-10-18 – 2023-10-22 (×5): 100 mg via ORAL
  Filled 2023-10-18 (×5): qty 1

## 2023-10-18 MED ORDER — APIXABAN 5 MG PO TABS
5.0000 mg | ORAL_TABLET | Freq: Two times a day (BID) | ORAL | Status: DC
  Administered 2023-10-18 – 2023-10-22 (×10): 5 mg via ORAL
  Filled 2023-10-18 (×10): qty 1

## 2023-10-18 MED ORDER — EZETIMIBE 10 MG PO TABS
10.0000 mg | ORAL_TABLET | Freq: Every day | ORAL | Status: DC
  Administered 2023-10-18 – 2023-10-21 (×4): 10 mg via ORAL
  Filled 2023-10-18 (×4): qty 1

## 2023-10-18 MED ORDER — ORAL CARE MOUTH RINSE: 15.0000 mL | OROMUCOSAL | Status: DC | PRN

## 2023-10-18 MED ORDER — SODIUM CHLORIDE 0.9 % IV SOLN: INTRAVENOUS | Status: AC

## 2023-10-18 MED ORDER — NORTRIPTYLINE HCL 25 MG PO CAPS
25.0000 mg | ORAL_CAPSULE | Freq: Every day | ORAL | Status: DC
  Administered 2023-10-18 – 2023-10-21 (×4): 25 mg via ORAL
  Filled 2023-10-18 (×4): qty 1

## 2023-10-18 MED ORDER — ACETAMINOPHEN 325 MG PO TABS: 650.0000 mg | ORAL_TABLET | Freq: Four times a day (QID) | ORAL | Status: DC | PRN

## 2023-10-18 MED ORDER — ACETAMINOPHEN 650 MG RE SUPP: 650.0000 mg | Freq: Four times a day (QID) | RECTAL | Status: DC | PRN

## 2023-10-18 MED ORDER — INSULIN ASPART 100 UNIT/ML IJ SOLN
0.0000 [IU] | Freq: Three times a day (TID) | INTRAMUSCULAR | Status: DC
  Administered 2023-10-18: 2 [IU] via SUBCUTANEOUS
  Administered 2023-10-19 – 2023-10-20 (×3): 1 [IU] via SUBCUTANEOUS
  Administered 2023-10-22: 2 [IU] via SUBCUTANEOUS
  Filled 2023-10-18: qty 0.09

## 2023-10-18 MED ORDER — INSULIN ASPART 100 UNIT/ML IJ SOLN
0.0000 [IU] | Freq: Every day | INTRAMUSCULAR | Status: DC
  Filled 2023-10-18: qty 0.05

## 2023-10-18 NOTE — H&P (Signed)
 History and Physical    [name] ZOX:[id number] DOB: 1940/12/27 DOA: 10/17/2023  PCP: Pete Brand, DO  Patient coming from: Home  Chief Complaint: Generalized weakness  HPI: [name] is a 83 y.o. female with medical history significant of hypertension, hyperlipidemia, permanent A-fib on Eliquis , CVA, type 2 diabetes, neuropathy, large cell lymphoma status post chemo, uterine prolapse with spastic bladder and recurrent UTIs, GERD presenting with a complaint of generalized weakness.  History provided by the patient and her daughter at bedside.  On Easter, patient tripped over carpet and fell injuring her right knee.  Then 2 days ago she had another fall due to generalized weakness/losing balance.  Since after her 2 recent falls, she is having pain in her lower back.  She saw orthopedics yesterday for evaluation of her low back pain and right knee pain and was sent to the ED for further evaluation.  Patient reports having some lightheadedness but denies any episodes of syncope/loss of consciousness and daughter confirms.  Also denies chest pain, palpitations, or shortness of breath.  She is endorsing dysuria, urinary frequency and urgency, and lower abdominal/suprapubic discomfort.  Reports subjective fever at home.  Endorsing nonbloody diarrhea for the past 4 to 5 days but no nausea or vomiting.  Denies recent antibiotic use.  ED Course: Vital signs on arrival: Temperature 98.3 F, pulse 90, respiratory rate 21, blood pressure 133/82, and SpO2 97% on room air.  Labs showing no leukocytosis, hemoglobin 11.3 (at baseline), sodium 133, potassium 3.4, chloride 97, glucose 141, creatinine 0.9, T. bili 2.8 and remainder of LFTs normal, lipase normal, troponin 31> 30, magnesium 1.8, C. difficile PCR and GI pathogen panel pending, UA pending.  CT head negative for acute intracranial abnormality.  X-ray of right knee showing unremarkable right knee arthroplasty, small suprapatellar joint  effusion, and no acute fracture.  X-ray of lumbar spine showing age-indeterminate L2 compression fracture, new since 2021 comparison study.  CT abdomen pelvis showing cystitis with right urothelial thickening suggestive of ascending infection.  Also showing a questionable 2.5 cm right renal interpolar mass for which radiologist is recommending further evaluation with outpatient dedicated MRI renal protocol.  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  EKG showing A-fib, diffuse borderline T wave abnormalities, baseline wander in V5, LAFB, QTc 576.  No acute ischemic changes in comparison to previous EKG. Patient was given Zofran , IV potassium 10 mEq x 3, and 1 L normal saline.  Review of Systems:  Review of Systems  All other systems reviewed and are negative.   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   Cerebral infarction due to embolism of right middle cerebral artery (HCC)    Diabetes mellitus    Diarrhea 11/30/2009   Qualifier: Diagnosis of   By: Ernie Heal MD, Cornelius       Excessive sleepiness 05/16/2022   Fatigue 05/16/2022   Fatty liver    Fibromyalgia    History of uterine prolapse 04/04/2022   Hyperlipidemia    Hypertension    Lymphoma malignant, large cell (HCC)    Neuropathy    Night sweat 05/16/2022   Non Hodgkin's lymphoma (HCC)    no active treatment   PAF (paroxysmal atrial fibrillation) (HCC)    Slurred speech 02/16/2015   Stroke (HCC)    Syncope 03/06/2018  Urge incontinence 04/04/2022   Urinary tract infection 09/19/2011   Being treated at this time Cipro    Past Surgical History:  Procedure Laterality Date   APPENDECTOMY     CATARACT EXTRACTION  2015   CHOLECYSTECTOMY     EYE SURGERY     JOINT REPLACEMENT     JOINT REPLACEMENT      LAMINECTOMY WITH POSTERIOR LATERAL ARTHRODESIS LEVEL 2 N/A 06/07/2019   Procedure: Laminectomy and Foraminotomy Lumbar three-Lumbar four  Lumbar four-Lumbar five with non-instrumented fusion;  Surgeon: Isadora Mar, MD;  Location: [facility] OR;  Service: Neurosurgery;  Laterality: N/A;   ROTATOR CUFF REPAIR       reports that she has never smoked. She has never used smokeless tobacco. She reports current alcohol use of about 1.0 standard drink of alcohol per week. She reports that she does not use drugs.  No Known Allergies  Family History  Problem Relation Age of Onset   Heart Problems Brother    Cancer Sister     Prior to Admission medications   Medication Sig Start Date End Date Taking? Authorizing Provider  Albuterol  Sulfate (PROAIR  RESPICLICK) 108 (90 Base) MCG/ACT AEPB Inhale 1 puff into the lungs every 6 (six) hours as needed. 05/20/22  Yes Regalado, Belkys A, MD  allopurinol (ZYLOPRIM) 100 MG tablet Take 100 mg by mouth daily.   Yes [provider]  apixaban  (ELIQUIS ) 5 MG TABS tablet Take 1 tablet (5 mg total) by mouth 2 (two) times daily. 02/18/15  Yes Lavaughn Portland, MD  Cholecalciferol  (VITAMIN D  PO) Take 1,000 Units by mouth daily.   Yes [provider]  ezetimibe  (ZETIA ) 10 MG tablet Take 10 mg by mouth at bedtime.   Yes [provider]  hydrochlorothiazide  (HYDRODIURIL ) 12.5 MG tablet Take 12.5 mg by mouth daily. 09/17/15  Yes [provider]  losartan  (COZAAR ) 100 MG tablet Take 1 tablet by mouth once daily 10/06/23  Yes Crenshaw, Deannie Fabian, MD  metFORMIN  (GLUCOPHAGE ) 500 MG tablet Take 500 mg by mouth daily. 12/10/14  Yes [provider]  metoprolol  succinate (TOPROL -XL) 25 MG 24 hr tablet Take 25 mg by mouth daily.   Yes [provider]  traMADol  (ULTRAM ) 50 MG tablet Take 50 mg by mouth every 6 (six) hours as needed for moderate pain (pain score 4-6). 09/24/23  Yes [provider]  guaiFENesin  (ROBITUSSIN) 100 MG/5ML liquid Take  5 mLs by mouth every 4 (four) hours as needed for cough or to loosen phlegm. Patient not taking: Reported on 10/18/2023 05/20/22   Regalado, Belkys A, MD  nortriptyline (PAMELOR) 25 MG capsule Take 25 mg by mouth at bedtime.    [provider]  polyethylene glycol powder (GLYCOLAX /MIRALAX ) 17 GM/SCOOP powder Take 17 g by mouth daily. Drink 17g (1 scoop) dissolved in water per day. Patient not taking: Reported on 10/18/2023 12/08/22   Zuleta, Kaitlin G, NP    Physical Exam: Vitals:   10/17/23 2200 10/17/23 2300 10/17/23 2342 10/18/23 0056  BP: (!) 142/101 (!) 169/147  (!) 159/98  Pulse: 80 79  87  Resp: 20 (!) 24  (!) 22  Temp:   98.3 F (36.8 C)   TempSrc:   Oral   SpO2: 97% 97%  94%  Weight:      Height:        Physical Exam Vitals reviewed.  Constitutional:      General: She is not in acute distress. HENT:     Head: Normocephalic and atraumatic.  Mouth/Throat:     Mouth: Mucous membranes are dry.  Eyes:     Extraocular Movements: Extraocular movements intact.  Cardiovascular:     Rate and Rhythm: Normal rate and regular rhythm.     Pulses: Normal pulses.  Pulmonary:     Effort: Pulmonary effort is normal. No respiratory distress.     Breath sounds: Normal breath sounds. No wheezing or rales.  Abdominal:     General: Bowel sounds are normal. There is no distension.     Palpations: Abdomen is soft.     Tenderness: There is no abdominal tenderness. There is no guarding.  Musculoskeletal:     Cervical back: Normal range of motion.     Right lower leg: No edema.     Left lower leg: No edema.     Comments: Right knee bruising  Skin:    General: Skin is warm and dry.  Neurological:     General: No focal deficit present.     Mental Status: She is alert and oriented to person, place, and time.     Cranial Nerves: No cranial nerve deficit.     Sensory: No sensory deficit.     Motor: No weakness.     Labs on Admission: I have personally reviewed following labs  and imaging studies  CBC: Recent Labs  Lab 10/17/23 1658  WBC 8.3  NEUTROABS 6.6  HGB 11.3*  HCT 34.9*  MCV 99.7  PLT 220   Basic Metabolic Panel: Recent Labs  Lab 10/17/23 1658  NA 133*  K 3.4*  CL 97*  CO2 25  GLUCOSE 141*  BUN 20  CREATININE 0.95  CALCIUM  8.8*  MG 1.8   GFR: Estimated Creatinine Clearance: 35.7 mL/min (by C-G formula based on SCr of 0.95 mg/dL). Liver Function Tests: Recent Labs  Lab 10/17/23 1658  AST 32  ALT 31  ALKPHOS 77  BILITOT 2.8*  PROT 6.9  ALBUMIN 3.3*   Recent Labs  Lab 10/17/23 1658  LIPASE 27   No results for input(s): "AMMONIA" in the last 168 hours. Coagulation Profile: No results for input(s): "INR", "PROTIME" in the last 168 hours. Cardiac Enzymes: No results for input(s): "CKTOTAL", "CKMB", "CKMBINDEX", "TROPONINI" in the last 168 hours. BNP (last 3 results) No results for input(s): "PROBNP" in the last 8760 hours. HbA1C: No results for input(s): "HGBA1C" in the last 72 hours. CBG: No results for input(s): "GLUCAP" in the last 168 hours. Lipid Profile: No results for input(s): "CHOL", "HDL", "LDLCALC", "TRIG", "CHOLHDL", "LDLDIRECT" in the last 72 hours. Thyroid Function Tests: No results for input(s): "TSH", "T4TOTAL", "FREET4", "T3FREE", "THYROIDAB" in the last 72 hours. Anemia Panel: No results for input(s): "VITAMINB12", "FOLATE", "FERRITIN", "TIBC", "IRON", "RETICCTPCT" in the last 72 hours. Urine analysis:    Component Value Date/Time   COLORURINE YELLOW 05/12/2023 1227   APPEARANCEUR HAZY (A) 05/12/2023 1227   LABSPEC 1.008 05/12/2023 1227   PHURINE 6.0 05/12/2023 1227   GLUCOSEU NEGATIVE 05/12/2023 1227   GLUCOSEU NEG mg/dL 16/03/9603 5409   HGBUR SMALL (A) 05/12/2023 1227   BILIRUBINUR NEGATIVE 05/12/2023 1227   BILIRUBINUR Negative 07/22/2022 1159   KETONESUR NEGATIVE 05/12/2023 1227   PROTEINUR NEGATIVE 05/12/2023 1227   UROBILINOGEN 0.2 07/22/2022 1159   UROBILINOGEN 0.2 02/17/2015 0420    NITRITE NEGATIVE 05/12/2023 1227   LEUKOCYTESUR LARGE (A) 05/12/2023 1227    Radiological Exams on Admission: CT ABDOMEN PELVIS W CONTRAST Result Date: 10/17/2023 CLINICAL DATA:  Abdominal pain, acute, nonlocalized 83 yo F with a  cc of fatigue. Going on for the past week or so. Diarrhea. Fatigue has had some nausea and dizziness as well. Symptoms are worse when she tries to get up and move around. EXAM: CT ABDOMEN AND PELVIS WITH CONTRAST TECHNIQUE: Multidetector CT imaging of the abdomen and pelvis was performed using the standard protocol following bolus administration of intravenous contrast. RADIATION DOSE REDUCTION: This exam was performed according to the departmental dose-optimization program which includes automated exposure control, adjustment of the mA and/or kV according to patient size and/or use of iterative reconstruction technique. CONTRAST:  OMNIPAQUE IOHEXOL 300 MG/ML  SOLN COMPARISON:  MRI lumbar spine 05/24/2019 FINDINGS: Lower chest: Small hiatal hernia. Coronary artery calcifications. No acute abnormality. Hepatobiliary: No focal liver abnormality. No gallstones, gallbladder wall thickening, or pericholecystic fluid. No biliary dilatation. Pancreas: Diffusely atrophic. No focal lesion. Otherwise normal pancreatic contour. No surrounding inflammatory changes. No main pancreatic ductal dilatation. Spleen: Normal in size without focal abnormality. Adrenals/Urinary Tract: No adrenal nodule bilaterally. Delayed right nephrogram. Atrophic right kidney with renal cortical scarring. Right urothelial thickening. Question right renal interpolar mass measuring 2.5 cm. No hydronephrosis. No hydroureter.  No nephroureterolithiasis. Urinary bladder wall thickening. On delayed imaging, there is no urothelial wall thickening and there are no filling defects in the opacified portions of the left collecting system or ureter. Stomach/Bowel: Stomach is within normal limits. No evidence of bowel wall  thickening or dilatation. Colonic diverticulosis. The appendix is not definitely identified with no inflammatory changes in the right lower quadrant to suggest acute appendicitis. Vascular/Lymphatic: No abdominal aorta or iliac aneurysm. Severe atherosclerotic plaque of the aorta and its branches. 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 No abdominal, pelvic, or inguinal lymphadenopathy. Reproductive: Pessary device noted. Uterus and bilateral adnexa are unremarkable. Other: No intraperitoneal free fluid. No intraperitoneal free gas. No organized fluid collection. Musculoskeletal: No abdominal wall hernia or abnormality. No suspicious lytic or blastic osseous lesions. No acute displaced fracture. Multilevel degenerative changes of the spine. Age-indeterminate superior endplate L2 compression fracture with at least 40% vertebral body height loss. Mild to moderate right hip degenerative changes. IMPRESSION: 1. Cystitis with right urothelial thickening suggestive of ascending infection-correlate for with urinalysis for infection. 2. Question 2.5 cm right renal interpolar mass. When the patient is clinically stable and able to follow directions and hold their breath (preferably as an outpatient) further evaluation with dedicated MRI renal protocol should be considered. 3. Small hiatal hernia. 4. Colonic diverticulosis with no acute diverticulitis. 5. 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 Electronically Signed   By: Morgane  Naveau M.D.   On: 10/17/2023 23:44   DG Lumbar Spine Complete Result Date: 10/17/2023 CLINICAL DATA:  Multiple falls, pain EXAM: LUMBAR SPINE - COMPLETE 4+ VIEW COMPARISON:  09/03/2019 FINDINGS: Frontal, bilateral oblique, lateral views of the lumbar spine are obtained. There are 5 non-rib-bearing lumbar type vertebral bodies in normal anatomic alignment. There is an age  indeterminate compression deformity involving the superior endplate of the L2 vertebral body, with approximately 10% loss of vertebral body height. This is new since 2021, with no interval studies available for comparison. No other acute bony abnormalities. There is diffuse lumbar facet hypertrophy. Mild diffuse spondylosis, greatest at the thoracolumbar junction. Sacroiliac joints appear unremarkable. IMPRESSION: 1. Age indeterminate compression deformity within the superior endplate of the L2 vertebral body, with approximately 10% loss of vertebral body height. This is new since the 2021 comparison study, with no interval exams  available for comparison. Please correlate with site of patient's pain, as this could reflect sequela of recent trauma. 2. Diffuse lumbar spondylosis and facet hypertrophy as above. Electronically Signed   By: Bobbye Burrow M.D.   On: 10/17/2023 17:03   DG Knee Complete 4 Views Right Result Date: 10/17/2023 CLINICAL DATA:  Multiple falls, knee pain EXAM: RIGHT KNEE - COMPLETE 4+ VIEW COMPARISON:  None Available. FINDINGS: Frontal, bilateral oblique, and cross-table lateral views of the right knee are obtained on 4 images. Three component right knee arthroplasty is identified in the expected position without evidence of acute complication. No acute displaced fractures. Small suprapatellar joint effusion. Soft tissues are unremarkable. IMPRESSION: 1. Unremarkable right knee arthroplasty. 2. Small joint effusion. 3. No acute fracture. Electronically Signed   By: Bobbye Burrow M.D.   On: 10/17/2023 17:00   CT Head Wo Contrast Result Date: 10/17/2023 CLINICAL DATA:  Vertigo, central. EXAM: CT HEAD WITHOUT CONTRAST TECHNIQUE: Contiguous axial images were obtained from the base of the skull through the vertex without intravenous contrast. RADIATION DOSE REDUCTION: This exam was performed according to the departmental dose-optimization program which includes automated exposure control,  adjustment of the mA and/or kV according to patient size and/or use of iterative reconstruction technique. COMPARISON:  Head CT 05/12/2023 FINDINGS: Brain: There is no evidence of an acute infarct, intracranial hemorrhage, mass, midline shift, or extra-axial fluid collection. There is mild cerebral atrophy. Patchy hypodensities in the cerebral white matter bilaterally are unchanged and nonspecific but compatible with mild-to-moderate chronic small vessel ischemic disease. Vascular: Calcified atherosclerosis at the skull base. No hyperdense vessel. Skull: No fracture or suspicious lesion. Sinuses/Orbits: Visualized paranasal sinuses and mastoid air cells are clear. Bilateral cataract extraction. Other: None. IMPRESSION: 1. No evidence of acute intracranial abnormality. 2. Mild-to-moderate chronic small vessel ischemic disease. Electronically Signed   By: Aundra Lee M.D.   On: 10/17/2023 16:22    Assessment and Plan  Generalized weakness/recurrent falls Patient denies any episodes of syncope and daughter confirms.  EKG showing rate controlled A-fib and no acute ischemic changes.  Troponin mildly elevated but stable and not consistent with ACS.  Patient is not endorsing chest pain.  CT head negative for acute intracranial abnormality and no focal neurodeficit on exam.  No seizure-like activity reported.  Patient does endorse slight lightheadedness.  Will continue metoprolol  for A-fib but hold other antihypertensives at this time until orthostatics checked.  Gentle IV fluid hydration.  PT/OT eval, fall precautions.  Since last echo was about 9 years ago, repeat ordered.  Diarrhea No fever or leukocytosis.  No evidence of colitis on CT.  C. difficile PCR and GI pathogen panel pending, enteric precautions.  Mild hyponatremia Continue gentle IV fluid hydration with normal saline and monitor labs.  Mild hypokalemia QT prolongation Magnesium within normal range.  Replace potassium and continue to monitor  labs.  Avoid QT prolonging drugs and follow-up repeat EKG in the morning.  Mild elevation of total bilirubin CT showing no acute hepatobiliary abnormality.  Monitor LFTs.  Possible UTI Patient with history of uterine prolapse with spastic bladder and recurrent UTIs/CRE Kleb pneumo colonization.  She is endorsing urinary frequency/urgency, dysuria, and suprapubic discomfort.  CT showing cystitis with right urothelial thickening suggestive of ascending infection.  No fever or leukocytosis.  UA pending to confirm signs of infection.  L2 compression fracture Patient is endorsing lower back pain in the setting of recent falls and x-ray showing age-indeterminate L2 compression fracture, new since 2021 comparison study.  Continue pain management.  Incidental renal mass CT showing a questionable 2.5 cm right renal interpolar mass for which radiologist is recommending further evaluation with outpatient dedicated MRI renal protocol.    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  Patient is not endorsing epigastric abdominal pain, nausea, or vomiting.  Outpatient follow-up for further evaluation.  Hypertension Continue metoprolol  but holding other antihypertensives at this time until orthostatics checked.  Hyperlipidemia Continue Zetia .  Permanent A-fib Currently rate controlled.  Continue metoprolol  and Eliquis .  History of CVA Continue Eliquis  and Zetia .  Type 2 diabetes Last A1c 6.2 in December 2020, repeat ordered.  Placed on sensitive sliding scale insulin  ACHS.  Hold metformin  given diarrhea.  Chronic normocytic anemia Hemoglobin at baseline, monitor labs.  Gout Continue allopurinol.  Mood disorder Continue nortriptyline.  DVT prophylaxis: Eliquis  Code Status: Full Code (discussed with the patient) Family Communication: Daughter at bedside. Level of care: Progressive Care Unit Admission status: It is  my clinical opinion that referral for OBSERVATION is reasonable and necessary in this patient based on the above information provided. The aforementioned taken together are felt to place the patient at high risk for further clinical deterioration. However, it is anticipated that the patient may be medically stable for discharge from the hospital within 24 to 48 hours.  Juliette Oh MD [facility]  If 7PM-7AM, please contact night-coverage [email or web address]  10/18/2023, 1:11 AM

## 2023-10-18 NOTE — Progress Notes (Signed)
 This is a disorder note for this 83 year old female who has been admitted for the generalized weakness 7, possible UTI and recurrent falls.  Patient was seen and examined this morning.  Patient reports that her pain in the right knee is better where she got hurt following.  The patient denies having any dysuria at this time.  Patient denies feeling dizzy.  Generalized weakness/recurrent falls Patient denies any episodes of syncope and daughter confirms.  EKG showing rate controlled A-fib and no acute ischemic changes.  Troponin mildly elevated but stable and not consistent with ACS.  Patient is not endorsing chest pain.  CT head negative for acute intracranial abnormality and no focal neurodeficit on exam.  No seizure-like activity reported.  Patient does endorse slight lightheadedness.  Will continue metoprolol  for A-fib but hold other antihypertensives at this time until orthostatics checked.  Gentle IV fluid hydration.  PT/OT eval, fall precautions.  Since last echo was about 9 years ago, repeat ordered. Orthostatics has been ordered.   Diarrhea No fever or leukocytosis.  No evidence of colitis on CT.  C. difficile PCR and GI pathogen panel pending, enteric precautions.   Mild hyponatremia Continue gentle IV fluid hydration with normal saline and monitor labs. Sodium 133, BMP in a.m.   Mild hypokalemia QT prolongation Magnesium within normal range.  Replace potassium and continue to monitor labs.  Avoid QT prolonging drugs and follow-up repeat EKG in the morning.  Hypokalemia resolved current potassium 3.8.  Repeat BMP in the morning   Mild elevation of total bilirubin CT showing no acute hepatobiliary abnormality.  Monitor LFTs.  Patient  is currently asymptomatic   Possible UTI Patient with history of uterine prolapse with spastic bladder and recurrent UTIs/CRE Kleb pneumo colonization.  She is endorsing urinary frequency/urgency, dysuria, and suprapubic discomfort.  CT showing cystitis  with right urothelial thickening suggestive of ascending infection.  No fever or leukocytosis.  UA pending to confirm signs of infection.   L2 compression fracture Patient is endorsing lower back pain in the setting of recent falls and x-ray showing age-indeterminate L2 compression fracture, new since 2021 comparison study.  Continue pain management.   Incidental renal mass CT showing a questionable 2.5 cm right renal interpolar mass for which radiologist is recommending further evaluation with outpatient dedicated MRI renal protocol.     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  Patient is not endorsing epigastric abdominal pain, nausea, or vomiting.  Outpatient follow-up for further evaluation.   Hypertension Continue metoprolol  but holding other antihypertensives at this time until orthostatics checked.   Hyperlipidemia Continue Zetia .   Permanent A-fib Currently rate controlled.  Continue metoprolol  and Eliquis .   History of CVA Continue Eliquis  and Zetia .   Type 2 diabetes Last A1c 6.2 in December 2020, repeat ordered.  Placed on sensitive sliding scale insulin  ACHS.  Hold metformin  given diarrhea.   Chronic normocytic anemia Hemoglobin at baseline, monitor labs.   Gout Continue allopurinol.   Mood disorder Continue nortriptyline.   DVT prophylaxis: Eliquis 

## 2023-10-18 NOTE — Progress Notes (Signed)
  Echocardiogram 2D Echocardiogram has been performed.  Almando Brawley L Anshu Wehner RDCS 10/18/2023, 2:35 PM

## 2023-10-18 NOTE — Evaluation (Signed)
 Occupational Therapy Evaluation Patient Details Name: [name] MRN: [id number] DOB: 02/25/1941 Today's Date: 10/18/2023   History of Present Illness   [name] is a 83 y.o. female reports with fatigue, diarrhea, nausea, dizziness, falls. PMH: hypertension, hyperlipidemia, permanent A-fib on Eliquis , CVA, type 2 diabetes, neuropathy, large cell lymphoma status post chemo, uterine prolapse with spastic bladder and recurrent UTIs, GERD     Clinical Impressions Patient is a 83 year old female who was admitted for above. Patient was living at home with husband with no AD independently prior level. Currently, patient limited to dangle EOB with increased pain in knee and reports of dizziness. BP was 167/95 mmhg sitting EOB. Patient was noted to have decreased functional activity tolernace, decreased ROM, decreased BUE strength, decreased endurance, decreased sitting balance, decreased standing balanced, decreased safety awareness, and decreased knowledge of AE/AD impacting participation in ADLs. Patient will benefit from continued inpatient follow up therapy, <3 hours/day.      If plan is discharge home, recommend the following:   Two people to help with walking and/or transfers;A lot of help with bathing/dressing/bathroom;Assistance with cooking/housework;Direct supervision/assist for medications management;Direct supervision/assist for financial management;Help with stairs or ramp for entrance;Assist for transportation     Functional Status Assessment   Patient has had a recent decline in their functional status and demonstrates the ability to make significant improvements in function in a reasonable and predictable amount of time.     Equipment Recommendations   None recommended by OT       Mobility Bed Mobility Overal bed mobility: Needs Assistance   Rolling: Min assist Sidelying to sit: Mod assist     Sit to sidelying: Max assist General bed mobility  comments: min A to roll onto R side, mod A to upright trunk into sitting with therapist blocking pt from sliding off elevated gurney in ED, max A to lift BLE and support trunk with return to sidelying due to pt's short stature in combination with elevated gurney height            Balance Overall balance assessment: Needs assistance, History of Falls Sitting-balance support: Feet unsupported, Bilateral upper extremity supported Sitting balance-Leahy Scale: Poor Sitting balance - Comments: patient on ED bed with feet unable to reach the floor.         ADL either performed or assessed with clinical judgement   ADL Overall ADL's : Needs assistance/impaired Eating/Feeding: Set up;Sitting Eating/Feeding Details (indicate cue type and reason): asking for food. nurse messaged. taking small sips of water. Grooming: Sitting;Minimal assistance   Upper Body Bathing: Sitting;Minimal assistance   Lower Body Bathing: Maximal assistance   Upper Body Dressing : Minimal assistance;Sitting   Lower Body Dressing: Maximal assistance;Sitting/lateral leans     Toilet Transfer Details (indicate cue type and reason): deferred attempt to stand with patients significant knee pain and short stature impacting safety on ED bed. patient was min A for advacnement to EOB. patient was max A to get back into bed. Toileting- Clothing Manipulation and Hygiene: Bed level;Total assistance               Vision   Vision Assessment?: No apparent visual deficits            Pertinent Vitals/Pain Pain Assessment Pain Assessment: 0-10 Pain Score: 5  Pain Location: back and BLE Pain Descriptors / Indicators: Grimacing, Discomfort Pain Intervention(s): Limited activity within patient's tolerance, Monitored during session, Repositioned     Extremity/Trunk Assessment Upper Extremity Assessment Upper Extremity  Assessment: Overall WFL for tasks assessed   Lower Extremity Assessment Lower Extremity  Assessment: Defer to PT evaluation (significant brusing on R knee with edema noted. tender to light touch) RLE Deficits / Details: bruising throughout R knee noted, ankle and hip AROM WFL, full knee extension, knee flexion ~20 deg beore stopping due to pain RLE Sensation: decreased light touch ("feels different" throughout entire extremitiy to light touch compared to L LE) RLE Coordination: WNL LLE Deficits / Details: AROM WFL throughout, full knee extension and knee flexion ~110 deg LLE Sensation: WNL LLE Coordination: WNL   Cervical / Trunk Assessment Cervical / Trunk Assessment: Kyphotic   Communication Communication Communication: No apparent difficulties   Cognition Arousal: Alert Behavior During Therapy: WFL for tasks assessed/performed Cognition: No apparent impairments             OT - Cognition Comments: daughter presnt in room as well. patient was not very conversive during session.                 Following commands: Intact       Cueing  General Comments      Bruising noted at knee           Home Living Family/patient expects to be discharged to:: Private residence Living Arrangements: Spouse/significant other Available Help at Discharge: Family;Available PRN/intermittently Type of Home: House Home Access: Stairs to enter [employer] of Steps: 1 Entrance Stairs-Rails: None Home Layout: One level     Bathroom Shower/Tub: Walk-in shower;Tub/shower unit         Home Equipment: Cane - single point;Rollator (4 wheels)   Additional Comments: recent HHPT ending ~1 month ago with good success per pt and daughter      Prior Functioning/Environment Prior Level of Function : Independent/Modified Independent             Mobility Comments: pt reports using rollator for community distances and in the home, reports ~6 falls in the last 6 months (2 in the last 2 weeks) ADLs Comments: pt reports ind with self care, simple meals, has a  cleaning lady    OT Problem List: Decreased activity tolerance;Decreased knowledge of use of DME or AE;Cardiopulmonary status limiting activity;Pain;Decreased knowledge of precautions;Decreased safety awareness;Impaired balance (sitting and/or standing)   OT Treatment/Interventions: Self-care/ADL training;Therapeutic exercise;Patient/family education;Therapeutic activities;Balance training      OT Goals(Current goals can be found in the care plan section)   Acute Rehab OT Goals Patient Stated Goal: to eat breakfast OT Goal Formulation: With patient/family Time For Goal Achievement: 11/01/23 Potential to Achieve Goals: Fair   OT Frequency:  Min 2X/week       AM-PAC OT "6 Clicks" Daily Activity     Outcome Measure Help from another person eating meals?: A Little Help from another person taking care of personal grooming?: A Little Help from another person toileting, which includes using toliet, bedpan, or urinal?: Total Help from another person bathing (including washing, rinsing, drying)?: Total Help from another person to put on and taking off regular upper body clothing?: A Little Help from another person to put on and taking off regular lower body clothing?: A Lot 6 Click Score: 13   End of Session Nurse Communication: Mobility status  Activity Tolerance: Patient limited by pain Patient left: in bed;with call bell/phone within reach;with family/visitor present (in ED)  OT Visit Diagnosis: Unsteadiness on feet (R26.81);Other abnormalities of gait and mobility (R26.89);History of falling (Z91.81);Muscle weakness (generalized) (M62.81);Pain  Time: 1610-9604 OT Time Calculation (min): 19 min Charges:  OT General Charges $OT Visit: 1 Visit OT Evaluation $OT Eval Low Complexity: 1 Low  Jaythan Hinely OTR/L, MS Acute Rehabilitation Department Office# [phone]   Jame Maze 10/18/2023, 12:19 PM

## 2023-10-18 NOTE — ED Notes (Signed)
 Patient has had multiple wet diapers. Writer asked patient if was able to tell when she has to pee, and she stated sometimes yes and no. Patient is aware we need urine sample.

## 2023-10-18 NOTE — Evaluation (Signed)
 Physical Therapy Evaluation Patient Details Name: [name] MRN: [id number] DOB: 06-11-1941 Today's Date: 10/18/2023  History of Present Illness  [name] is a 83 y.o. female reports with fatigue, diarrhea, nausea, dizziness, falls. PMH: hypertension, hyperlipidemia, permanent A-fib on Eliquis , CVA, type 2 diabetes, neuropathy, large cell lymphoma status post chemo, uterine prolapse with spastic bladder and recurrent UTIs, GERD  Clinical Impression  Pt admitted with above diagnosis. Pt and daughter at bedside providing history, both report pt ind with rollator for all ambulation, ind with self care and light cooking, has cleaning lady, spouse at home is very helpful, ~6 falls in the last 6 months with 2 being in the last 2 weeks. On eval, pt limited by back and R LE pain, reporting back pain higher than knee but unable to quantify 0-10. Pt needing min-mod A with rolling onto side and sitting up, educated on log roll technique for back comfort. Powers to stand from elevated gurney with min A+2 for safety, HHA and pt able to take 2 shuffling sidesteps up to [facility], unable to leave bedside due to pain, therapist in position to block knees though no buckling noted. Pt's short stature compared to elevated gurney requiring max A to assist back into bed for BLE and trunk assist. Pt and daughter report past success with HHPT, ended ~1 month ago, and hopeful to return home with [facility] services. Recommend HHPT at d/c with family support. Pt currently with functional limitations due to the deficits listed below (see PT Problem List). Pt will benefit from acute skilled PT to increase their independence and safety with mobility to allow discharge.           If plan is discharge home, recommend the following: A little help with walking and/or transfers;A little help with bathing/dressing/bathroom;Assist for transportation;Assistance with cooking/housework;Help with stairs or ramp for entrance   Can  travel by private vehicle        Equipment Recommendations None recommended by PT  Recommendations for Other Services       Functional Status Assessment Patient has had a recent decline in their functional status and demonstrates the ability to make significant improvements in function in a reasonable and predictable amount of time.     Precautions / Restrictions Precautions Precautions: Fall Recall of Precautions/Restrictions: Intact Restrictions Weight Bearing Restrictions Per Provider Order: No      Mobility  Bed Mobility Overal bed mobility: Needs Assistance Bed Mobility: Rolling, Sidelying to Sit, Sit to Sidelying Rolling: Min assist Sidelying to sit: Mod assist     Sit to sidelying: Max assist General bed mobility comments: min A to roll onto R side, mod A to upright trunk into sitting with therapist blocking pt from sliding off elevated gurney in ED, max A to lift BLE and support trunk with return to sidelying due to pt's short stature in combination with elevated gurney height    Transfers Overall transfer level: Needs assistance Equipment used: 2 person hand held assist Transfers: Sit to/from Stand Sit to Stand: Min assist, +2 safety/equipment           General transfer comment: min A to steady with rising, therapist blocking bil knees from buckling, elevated gurney height, HHA from therapist and pt's daughter at bedside    Ambulation/Gait Ambulation/Gait assistance: Min assist, +2 safety/equipment   Assistive device: 2 person hand held assist         General Gait Details: pt takes 2 shuffling steps up towards [facility] with bil HHA,  no knee buckling noted ,unable to take steps away from bedside due to pain in back and RLE  Stairs            Wheelchair Mobility     Tilt Bed    Modified Rankin (Stroke Patients Only)       Balance Overall balance assessment: Needs assistance, History of Falls Sitting-balance support: Feet unsupported,  Bilateral upper extremity supported Sitting balance-Leahy Scale: Poor Sitting balance - Comments: elevated gurney needing BUE support   Standing balance support: During functional activity, Bilateral upper extremity supported Standing balance-Leahy Scale: Poor                               Pertinent Vitals/Pain Pain Assessment Pain Assessment: Faces Faces Pain Scale: Hurts even more Pain Location: back and RLE Pain Descriptors / Indicators: Grimacing, Discomfort Pain Intervention(s): Limited activity within patient's tolerance, Monitored during session, Repositioned    Home Living Family/patient expects to be discharged to:: Private residence Living Arrangements: Spouse/significant other Available Help at Discharge: Family;Available PRN/intermittently Type of Home: House Home Access: Stairs to enter Entrance Stairs-Rails: None Entrance Stairs-Number of Steps: 1   Home Layout: One level Home Equipment: Cane - single point;Rollator (4 wheels) Additional Comments: recent HHPT ending ~1 month ago with good success per pt and daughter    Prior Function Prior Level of Function : Independent/Modified Independent             Mobility Comments: pt reports using rollator for community distances and in the home, reports ~6 falls in the last 6 months (2 in the last 2 weeks) ADLs Comments: pt reports ind with self care, simple meals, has a cleaning lady     Extremity/Trunk Assessment   Upper Extremity Assessment Upper Extremity Assessment: Defer to OT evaluation    Lower Extremity Assessment Lower Extremity Assessment: RLE deficits/detail;LLE deficits/detail;Generalized weakness RLE Deficits / Details: bruising throughout R knee noted, ankle and hip AROM WFL, full knee extension, knee flexion ~20 deg beore stopping due to pain RLE Sensation: decreased light touch ("feels different" throughout entire extremitiy to light touch compared to L LE) RLE Coordination:  WNL LLE Deficits / Details: AROM WFL throughout, full knee extension and knee flexion ~110 deg LLE Sensation: WNL LLE Coordination: WNL       Communication   Communication Communication: No apparent difficulties    Cognition Arousal: Alert Behavior During Therapy: WFL for tasks assessed/performed   PT - Cognitive impairments: No apparent impairments                         Following commands: Intact       Cueing Cueing Techniques: Verbal cues, Tactile cues     General Comments General comments (skin integrity, edema, etc.): Bruising noted at knee    Exercises     Assessment/Plan    PT Assessment Patient needs continued PT services  PT Problem List Decreased strength;Decreased activity tolerance;Decreased balance;Decreased mobility;Decreased knowledge of use of DME;Impaired sensation;Decreased skin integrity;Pain       PT Treatment Interventions DME instruction;Gait training;Functional mobility training;Therapeutic activities;Therapeutic exercise;Balance training;Neuromuscular re-education;Patient/family education    PT Goals (Current goals can be found in the Care Plan section)  Acute Rehab PT Goals Patient Stated Goal: return home, HHPT/OT PT Goal Formulation: With patient/family Time For Goal Achievement: 11/01/23 Potential to Achieve Goals: Good    Frequency Min 3X/week     Co-evaluation  AM-PAC PT "6 Clicks" Mobility  Outcome Measure Help needed turning from your back to your side while in a flat bed without using bedrails?: A Lot Help needed moving from lying on your back to sitting on the side of a flat bed without using bedrails?: A Lot Help needed moving to and from a bed to a chair (including a wheelchair)?: A Lot Help needed standing up from a chair using your arms (e.g., wheelchair or bedside chair)?: A Lot Help needed to walk in hospital room?: Total Help needed climbing 3-5 steps with a railing? : Total 6 Click  Score: 10    End of Session Equipment Utilized During Treatment: Gait belt Activity Tolerance: Patient tolerated treatment well;Patient limited by pain Patient left: in bed;with call bell/phone within reach;with family/visitor present Nurse Communication: Mobility status;Other (comment) (back pain, R knee pain) PT Visit Diagnosis: Unsteadiness on feet (R26.81);Muscle weakness (generalized) (M62.81);History of falling (Z91.81);Difficulty in walking, not elsewhere classified (R26.2);Pain Pain - Right/Left: Right Pain - part of body: Knee (and back)    Time: 2956-2130 PT Time Calculation (min) (ACUTE ONLY): 19 min   Charges:   PT Evaluation $PT Eval Low Complexity: 1 Low   PT General Charges $$ ACUTE PT VISIT: 1 Visit         Tori Kaitland Lewellyn PT, DPT 10/18/23, 9:26 AM

## 2023-10-19 DIAGNOSIS — R531 Weakness: Secondary | ICD-10-CM | POA: Diagnosis not present

## 2023-10-19 LAB — COMPREHENSIVE METABOLIC PANEL WITH GFR
ALT: 56 U/L — ABNORMAL HIGH (ref 0–44)
AST: 59 U/L — ABNORMAL HIGH (ref 15–41)
Albumin: 3 g/dL — ABNORMAL LOW (ref 3.5–5.0)
Alkaline Phosphatase: 66 U/L (ref 38–126)
Anion gap: 12 (ref 5–15)
BUN: 16 mg/dL (ref 8–23)
CO2: 21 mmol/L — ABNORMAL LOW (ref 22–32)
Calcium: 8.4 mg/dL — ABNORMAL LOW (ref 8.9–10.3)
Chloride: 100 mmol/L (ref 98–111)
Creatinine, Ser: 0.92 mg/dL (ref 0.44–1.00)
GFR, Estimated: >60 mL/min (ref >60)
Glucose, Bld: 107 mg/dL — ABNORMAL HIGH (ref 70–99)
Potassium: 3.7 mmol/L (ref 3.5–5.1)
Sodium: 133 mmol/L — ABNORMAL LOW (ref 135–145)
Total Bilirubin: 2.1 mg/dL — ABNORMAL HIGH (ref 0.0–1.2)
Total Protein: 6.5 g/dL (ref 6.5–8.1)

## 2023-10-19 LAB — CBC WITH DIFFERENTIAL/PLATELET
Abs Immature Granulocytes: 0.06 10*3/uL (ref 0.00–0.07)
Basophils Absolute: 0 10*3/uL (ref 0.0–0.1)
Basophils Relative: 0 %
Eosinophils Absolute: 0.1 10*3/uL (ref 0.0–0.5)
Eosinophils Relative: 1 %
HCT: 34.7 % — ABNORMAL LOW (ref 36.0–46.0)
Hemoglobin: 11.4 g/dL — ABNORMAL LOW (ref 12.0–15.0)
Immature Granulocytes: 1 %
Lymphocytes Relative: 23 %
Lymphs Abs: 2 10*3/uL (ref 0.7–4.0)
MCH: 31.8 pg (ref 26.0–34.0)
MCHC: 32.9 g/dL (ref 30.0–36.0)
MCV: 96.9 fL (ref 80.0–100.0)
Monocytes Absolute: 1.1 10*3/uL — ABNORMAL HIGH (ref 0.1–1.0)
Monocytes Relative: 13 %
Neutro Abs: 5.2 10*3/uL (ref 1.7–7.7)
Neutrophils Relative %: 62 %
Platelets: 188 10*3/uL (ref 150–400)
RBC: 3.58 MIL/uL — ABNORMAL LOW (ref 3.87–5.11)
RDW: 13.7 % (ref 11.5–15.5)
WBC: 8.5 10*3/uL (ref 4.0–10.5)
nRBC: 0 % (ref 0.0–0.2)

## 2023-10-19 LAB — GLUCOSE, CAPILLARY
Glucose-Capillary: 107 mg/dL — ABNORMAL HIGH (ref 70–99)
Glucose-Capillary: 133 mg/dL — ABNORMAL HIGH (ref 70–99)
Glucose-Capillary: 150 mg/dL — ABNORMAL HIGH (ref 70–99)
Glucose-Capillary: 125 mg/dL — ABNORMAL HIGH (ref 70–99)

## 2023-10-19 MED ORDER — TRAMADOL HCL 50 MG PO TABS
50.0000 mg | ORAL_TABLET | Freq: Four times a day (QID) | ORAL | Status: DC | PRN
  Administered 2023-10-19 – 2023-10-21 (×2): 50 mg via ORAL
  Filled 2023-10-19 (×2): qty 1

## 2023-10-19 MED ORDER — LOSARTAN POTASSIUM 50 MG PO TABS
100.0000 mg | ORAL_TABLET | Freq: Every day | ORAL | Status: DC
  Administered 2023-10-19 – 2023-10-22 (×4): 100 mg via ORAL
  Filled 2023-10-19 (×4): qty 2

## 2023-10-19 MED ORDER — SODIUM CHLORIDE 0.9 % IV SOLN
1.0000 g | Freq: Every day | INTRAVENOUS | Status: DC
  Administered 2023-10-19 – 2023-10-22 (×4): 1 g via INTRAVENOUS
  Filled 2023-10-19 (×4): qty 10

## 2023-10-19 NOTE — Care Management Obs Status (Signed)
 MEDICARE OBSERVATION STATUS NOTIFICATION   Patient Details  Name: [name] MRN: [id number] Date of Birth: 1940/07/15   Medicare Observation Status Notification Given:  Yes    Zenon Hilda, LCSW 10/19/2023, 9:45 AM

## 2023-10-19 NOTE — TOC Initial Note (Signed)
 Transition of Care [facility]) - Initial/Assessment Note   Patient Details  Name: [name] MRN: [id number] Date of Birth: 1940/09/25  Transition of Care [facility]) CM/SW Contact:    Zenon Hilda, LCSW Phone Number: 10/19/2023, 2:04 PM  Clinical Narrative: PT evaluation recommended HH. CSW met with patient's spouse, [name], to discuss recommendations as patient was lethargic and sleeping. Spouse would like the patient to resume HH through [facility], which she was active with until about a month ago. CSW made referral to Melissa/Tracy with [facility], which is under review.  Expected Discharge Plan: Home w Home Health Services Barriers to Discharge: Continued Medical Work up  Patient Goals and CMS Choice Patient states their goals for this hospitalization and ongoing recovery are:: Resume HH with [facility] CMS Medicare.gov Compare Post Acute Care list provided to:: Patient Represenative (must comment) Choice offered to / list presented to : Spouse  Expected Discharge Plan and Services In-house Referral: Clinical Social Work Post Acute Care Choice: Home Health Living arrangements for the past 2 months: Single Family Home           DME Arranged: N/A DME Agency: NA HH Agency: Other - See comment Freight forwarder) Date HH Agency Contacted: 10/19/23 Representative spoke with at [facility] Agency: Melissa  Prior Living Arrangements/Services Living arrangements for the past 2 months: Single Family Home Lives with:: Spouse Patient language and need for interpreter reviewed:: Yes Need for Family Participation in Patient Care: Yes (Comment) Care giver support system in place?: Yes (comment) Criminal Activity/Legal Involvement Pertinent to Current Situation/Hospitalization: No - Comment as needed  Activities of Daily Living ADL Screening (condition at time of admission) Independently performs ADLs?: No Does the patient have a NEW difficulty with bathing/dressing/toileting/self-feeding that  is expected to last >3 days?: Yes (Initiates electronic notice to provider for possible OT consult) Does the patient have a NEW difficulty with getting in/out of bed, walking, or climbing stairs that is expected to last >3 days?: Yes (Initiates electronic notice to provider for possible PT consult) Does the patient have a NEW difficulty with communication that is expected to last >3 days?: No Is the patient deaf or have difficulty hearing?: No Does the patient have difficulty seeing, even when wearing glasses/contacts?: No Does the patient have difficulty concentrating, remembering, or making decisions?: No  Permission Sought/Granted Permission sought to share information with : Other (comment) Permission granted to share information with : Yes, Verbal Permission Granted Permission granted to share info w AGENCY: [facility]  Emotional Assessment Appearance:: Appears stated age Attitude/Demeanor/Rapport: Lethargic Orientation: : Oriented to Self, Oriented to Place, Oriented to  Time, Oriented to Situation Alcohol / Substance Use: Not Applicable Psych Involvement: No (comment)  Admission diagnosis:  Dehydration [E86.0] Fall in home, initial encounter [W19.XXXA, Y92.009] Acute pain of right knee [M25.561] Recurrent syncope [R55] Patient Active Problem List   Diagnosis Date Noted   Recurrent falls 10/18/2023   Hyponatremia 10/18/2023   Hypokalemia 10/18/2023   Vertebral compression fracture (HCC) 10/18/2023   Parainfluenza 10/17/2022   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/2022   Parainfluenza infection 05/20/2022   Respiratory illness 05/20/2022   UTI due to Klebsiella species 05/17/2022   Excessive sleepiness 05/16/2022   Night sweat 05/16/2022   Generalized weakness 05/16/2022   Urge incontinence 04/04/2022   History of uterine prolapse 04/04/2022   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   S/P lumbar laminectomy 06/07/2019   Abnormal EKG 03/06/2018   Preop  cardiovascular exam 03/06/2018   Chronic anticoagulation 04/21/2015   PAF (paroxysmal atrial fibrillation) (  HCC) 02/20/2015   Essential hypertension 02/20/2015   Dyslipidemia    History of CVA (cerebrovascular accident) 02/16/2015   Diabetes (HCC) 02/16/2015   Atrial flutter (HCC) 02/16/2015   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 [place] [address] Phone: [phone] Fax: [phone]  [facility] - [place], [place] - [address] [place] [address] Phone: [phone] Fax: [phone]  Social Drivers of Health (SDOH) Social History: SDOH Screenings   Food Insecurity: No Food Insecurity (10/18/2023)  Housing: Low Risk  (10/18/2023)  Transportation Needs: No Transportation Needs (10/18/2023)  Utilities: Not At Risk (10/18/2023)  Depression (PHQ2-9): Low Risk  (10/17/2022)  Social Connections: Moderately Integrated (10/18/2023)  Tobacco Use: Low Risk  (10/17/2023)   SDOH Interventions:    Readmission Risk Interventions    05/20/2022    3:36 PM  Readmission Risk Prevention Plan  Post Dischage Appt Complete  Medication Screening Complete  Transportation Screening Complete

## 2023-10-19 NOTE — Progress Notes (Signed)
 PROGRESS NOTE    JAMETTA VEEDER  EAV:[id number] DOB: [birth year] DOA: 10/17/2023 PCP: Pete Brand, DO    Brief Narrative:  IYSIS ABEGG is a 83 y.o. female with medical history significant of hypertension, hyperlipidemia, permanent A-fib on Eliquis , CVA, type 2 diabetes, neuropathy, large cell lymphoma status post chemo, uterine prolapse with spastic bladder and recurrent UTIs, GERD presenting with a complaint of generalized weakness.  History provided by the patient and her daughter at bedside.  On Easter, patient tripped over carpet and fell injuring her right knee.  Then 2 days ago she had another fall due to generalized weakness/losing balance.  Since after her 2 recent falls, she is having pain in her lower back.  She saw orthopedics yesterday for evaluation of her low back pain and right knee pain and was sent to the ED for further evaluation.  No further diarrhea.   Assessment and Plan: Generalized weakness/recurrent falls Patient denies any episodes of syncope and daughter confirms.  EKG showing rate controlled A-fib and no acute ischemic changes.  Troponin mildly elevated but stable and not consistent with ACS.  Patient is not endorsing chest pain.  CT head negative for acute intracranial abnormality and no focal neurodeficit on exam.  No seizure-like activity reported.  Patient does endorse slight lightheadedness.  Will continue metoprolol  for A-fib but hold other antihypertensives at this time until orthostatics checked.  Gentle IV fluid hydration.  PT/OT eval, fall precautions. -echo:Left ventricular ejection fraction, by estimation, is 45 to 50%.  The left ventricle has mildly decreased function. The left ventricle  demonstrates global hypokinesis.  Orthostatics negative   Diarrhea No further diarrhea   Mild hyponatremia Stable   Mild hypokalemia QT prolongation Replete   Mild elevation of total bilirubin CT showing no acute hepatobiliary abnormality.  Monitor  LFTs.  Patient  is currently asymptomatic   Possible UTI Patient with history of uterine prolapse with spastic bladder and recurrent UTIs/CRE Kleb pneumo colonization.  She is endorsing urinary frequency/urgency, dysuria, and suprapubic discomfort.  CT showing cystitis with right urothelial thickening suggestive of ascending infection.  - Not improved off antibiotics we will start Rocephin  although may need to expand antibiotics based on prior cultures - Culture pending   L2 compression fracture Patient is endorsing lower back pain in the setting of recent falls and x-ray showing age-indeterminate L2 compression fracture, new since 2021 comparison study.  Continue pain management.   Incidental renal mass CT showing a questionable 2.5 cm right renal interpolar mass for which radiologist is recommending further evaluation with outpatient dedicated MRI renal protocol.     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  Patient is not endorsing epigastric abdominal pain, nausea, or vomiting.  Outpatient follow-up for further evaluation.   Hypertension Continue metoprolol  but holding other antihypertensives at this time until orthostatics checked.   Hyperlipidemia Continue Zetia .   Permanent A-fib Currently rate controlled.  Continue metoprolol  and Eliquis .   History of CVA Continue Eliquis  and Zetia .   Type 2 diabetes Last A1c 6.2 in December 2020, repeat ordered.  Placed on sensitive sliding scale insulin  ACHS.  Hold metformin  given diarrhea.   Chronic normocytic anemia Hemoglobin at baseline, monitor labs.   Gout Continue allopurinol .   Mood disorder Continue nortriptyline .   DVT prophylaxis:  apixaban  (ELIQUIS ) tablet 5 mg    Code Status: Full Code Family Communication: Husband at bedside  Disposition Plan:  Level of care: Progressive Status is: Observation  Consultants:   None  Subjective: Continues to not feel well and feel weak as well as having issues with urine  Objective: Vitals:   10/19/23 0000 10/19/23 0400 10/19/23 0406 10/19/23 0858  BP:   (!) 155/91 (!) 158/89  Pulse:   87 83  Resp: (!) 29 (!) 28 18   Temp:   99 F (37.2 C) 98.5 F (36.9 C)  TempSrc:   Oral Oral  SpO2:   98% 95%  Weight:      Height:        Intake/Output Summary (Last 24 hours) at 10/19/2023 1233 Last data filed at 10/19/2023 0406 Gross per 24 hour  Intake 663.85 ml  Output 450 ml  Net 213.85 ml   Filed Weights   10/17/23 1441  Weight: 65.8 kg    Examination:   General: Appearance:    Mildly obese female in no acute distress     Lungs:     respirations unlabored  Heart:    Normal heart rate.    MS:   All extremities are intact.    Neurologic:   Awake, alert       Data Reviewed: I have personally reviewed following labs and imaging studies  CBC: Recent Labs  Lab 10/17/23 1658 10/18/23 0304 10/19/23 0508  WBC 8.3 6.6 8.5  NEUTROABS 6.6  --  5.2  HGB 11.3* 10.6* 11.4*  HCT 34.9* 31.8* 34.7*  MCV 99.7 98.1 96.9  PLT 220 189 188   Basic Metabolic Panel: Recent Labs  Lab 10/17/23 1658 10/18/23 0304 10/19/23 0508  NA 133* 133* 133*  K 3.4* 3.8 3.7  CL 97* 103 100  CO2 25 23 21*  GLUCOSE 141* 129* 107*  BUN 20 16 16   CREATININE 0.95 0.85 0.92  CALCIUM  8.8* 8.1* 8.4*  MG 1.8  --   --   PHOS  --  2.6  --    GFR: Estimated Creatinine Clearance: 36.8 mL/min (by C-G formula based on SCr of 0.92 mg/dL). Liver Function Tests: Recent Labs  Lab 10/17/23 1658 10/18/23 0304 10/19/23 0508  AST 32 42* 59*  ALT 31 39 56*  ALKPHOS 77 69 66  BILITOT 2.8* 2.5* 2.1*  PROT 6.9 6.3* 6.5  ALBUMIN 3.3* 3.0* 3.0*   Recent Labs  Lab 10/17/23 1658  LIPASE 27   No results for input(s): "AMMONIA" in the last 168 hours. Coagulation Profile: No results for input(s): "INR", "PROTIME" in the last 168 hours. Cardiac Enzymes: No results for  input(s): "CKTOTAL", "CKMB", "CKMBINDEX", "TROPONINI" in the last 168 hours. BNP (last 3 results) No results for input(s): "PROBNP" in the last 8760 hours. HbA1C: Recent Labs    10/18/23 0304  HGBA1C 5.7*   CBG: Recent Labs  Lab 10/18/23 0837 10/18/23 1704 10/18/23 2029 10/19/23 0728 10/19/23 1122  GLUCAP 109* 189* 95 107* 133*   Lipid Profile: No results for input(s): "CHOL", "HDL", "LDLCALC", "TRIG", "CHOLHDL", "LDLDIRECT" in the last 72 hours. Thyroid Function Tests: No results for input(s): "TSH", "T4TOTAL", "FREET4", "T3FREE", "THYROIDAB" in the last 72 hours. Anemia Panel: No results for input(s): "VITAMINB12", "FOLATE", "FERRITIN", "TIBC", "IRON", "RETICCTPCT" in the last 72 hours. Sepsis Labs: No results for input(s): "PROCALCITON", "LATICACIDVEN" in the last 168 hours.  No results found for this or any previous visit (from the past 240 hours).       Radiology Studies: ECHOCARDIOGRAM COMPLETE Result Date: 10/18/2023    ECHOCARDIOGRAM REPORT   Patient Name:   [facility] Date of Exam: 10/18/2023 Medical  Rec #:  [id number]           Height:       57.0 in Accession #:    [id number]          Weight:       145.0 lb Date of Birth:  04-17-1941           BSA:          1.569 m Patient Age:    82 years            BP:           172/101 mmHg Patient Gender: F                   HR:           84 bpm. Exam Location:  Inpatient Procedure: 2D Echo, Cardiac Doppler and Color Doppler (Both Spectral and Color            Flow Doppler were utilized during procedure). Indications:    Generalized weakness  History:        Patient has prior history of Echocardiogram examinations, most                 recent 02/17/2015. Abnormal ECG, Stroke, Arrythmias:Atrial                 Fibrillation and Atrial Flutter; Risk Factors:Dyslipidemia,                 Hypertension and Diabetes.  Sonographer:    Juanita Shaw Referring Phys: [id number] VASUNDHRA RATHORE IMPRESSIONS  1. Systolic function varies with  each beat, but overall appears mildly reduced. Left ventricular ejection fraction, by estimation, is 45 to 50%. The left ventricle has mildly decreased function. The left ventricle demonstrates global hypokinesis.  2. Right ventricular systolic function is low normal. The right ventricular size is normal. There is moderately elevated pulmonary artery systolic pressure.  3. Left atrial size was severely dilated.  4. The mitral valve is normal in structure. Trivial mitral valve regurgitation. No evidence of mitral stenosis.  5. The aortic valve is tricuspid. Aortic valve regurgitation is not visualized. No aortic stenosis is present.  6. The inferior vena cava is dilated in size with <50% respiratory variability, suggesting right atrial pressure of 15 mmHg. FINDINGS  Left Ventricle: Systolic function varies with each beat, but overall appears mildly reduced. Left ventricular ejection fraction, by estimation, is 45 to 50%. The left ventricle has mildly decreased function. The left ventricle demonstrates global hypokinesis. The left ventricular internal cavity size was normal in size. There is no left ventricular hypertrophy. Left ventricular diastolic function could not be evaluated due to atrial fibrillation. Indeterminate filling pressures. Right Ventricle: The right ventricular size is normal. No increase in right ventricular wall thickness. Right ventricular systolic function is low normal. There is moderately elevated pulmonary artery systolic pressure. The tricuspid regurgitant velocity  is 3.09 m/s, and with an assumed right atrial pressure of 15 mmHg, the estimated right ventricular systolic pressure is 53.2 mmHg. Left Atrium: Left atrial size was severely dilated. Right Atrium: Right atrial size was normal in size. Pericardium: There is no evidence of pericardial effusion. Mitral Valve: The mitral valve is normal in structure. Trivial mitral valve regurgitation. No evidence of mitral valve stenosis. MV peak  gradient, 3.1 mmHg. The mean mitral valve gradient is 1.0 mmHg. Tricuspid Valve: The tricuspid valve is normal in structure. Tricuspid valve regurgitation is trivial. No evidence of tricuspid stenosis. Aortic  Valve: The aortic valve is tricuspid. Aortic valve regurgitation is not visualized. No aortic stenosis is present. Aortic valve mean gradient measures 3.0 mmHg. Aortic valve peak gradient measures 4.9 mmHg. Aortic valve area, by VTI measures 1.69 cm. Pulmonic Valve: The pulmonic valve was normal in structure. Pulmonic valve regurgitation is trivial. No evidence of pulmonic stenosis. Aorta: The aortic root is normal in size and structure. Venous: The inferior vena cava is dilated in size with less than 50% respiratory variability, suggesting right atrial pressure of 15 mmHg. IAS/Shunts: No atrial level shunt detected by color flow Doppler.  LEFT VENTRICLE PLAX 2D LVIDd:         3.90 cm     Diastology LVIDs:         2.40 cm     LV e' medial:    6.53 cm/s LV PW:         1.00 cm     LV E/e' medial:  11.8 LV IVS:        0.80 cm     LV e' lateral:   5.87 cm/s LVOT diam:     1.80 cm     LV E/e' lateral: 13.1 LV SV:         28 LV SV Index:   18 LVOT Area:     2.54 cm  LV Volumes (MOD) LV vol d, MOD A2C: 65.3 ml LV vol d, MOD A4C: 78.2 ml LV vol s, MOD A2C: 28.8 ml LV vol s, MOD A4C: 32.1 ml LV SV MOD A2C:     36.5 ml LV SV MOD A4C:     78.2 ml LV SV MOD BP:      40.5 ml RIGHT VENTRICLE            IVC RV Basal diam:  3.50 cm    IVC diam: 2.00 cm RV Mid diam:    2.80 cm RV S prime:     7.07 cm/s TAPSE (M-mode): 1.4 cm LEFT ATRIUM             Index        RIGHT ATRIUM           Index LA diam:        3.90 cm 2.49 cm/m   RA Area:     13.10 cm LA Vol (A2C):   71.4 ml 45.51 ml/m  RA Volume:   27.90 ml  17.78 ml/m LA Vol (A4C):   54.3 ml 34.61 ml/m LA Biplane Vol: 62.1 ml 39.58 ml/m  AORTIC VALVE                    PULMONIC VALVE AV Area (Vmax):    1.37 cm     PV Vmax:          0.72 m/s AV Area (Vmean):   1.18 cm      PV Peak grad:     2.1 mmHg AV Area (VTI):     1.69 cm     PR End Diast Vel: 1.59 msec AV Vmax:           110.67 cm/s AV Vmean:          82.300 cm/s AV VTI:            0.164 m AV Peak Grad:      4.9 mmHg AV Mean Grad:      3.0 mmHg LVOT Vmax:         59.40 cm/s LVOT Vmean:  38.300 cm/s LVOT VTI:          0.109 m LVOT/AV VTI ratio: 0.67  AORTA Ao Root diam: 3.60 cm Ao Asc diam:  3.63 cm MITRAL VALVE               TRICUSPID VALVE MV Area (PHT): 4.36 cm    TR Peak grad:   38.2 mmHg MV Area VTI:   1.68 cm    TR Vmax:        309.00 cm/s MV Peak grad:  3.1 mmHg MV Mean grad:  1.0 mmHg    SHUNTS MV Vmax:       0.88 m/s    Systemic VTI:  0.11 m MV Vmean:      54.6 cm/s   Systemic Diam: 1.80 cm MV Decel Time: 174 msec MV E velocity: 77.10 cm/s Maudine Sos MD Electronically signed by Maudine Sos MD Signature Date/Time: 10/18/2023/7:12:02 PM    Final    CT ABDOMEN PELVIS W CONTRAST Result Date: 10/17/2023 CLINICAL DATA:  Abdominal pain, acute, nonlocalized 83 yo F with a cc of fatigue. Going on for the past week or so. Diarrhea. Fatigue has had some nausea and dizziness as well. Symptoms are worse when she tries to get up and move around. EXAM: CT ABDOMEN AND PELVIS WITH CONTRAST TECHNIQUE: Multidetector CT imaging of the abdomen and pelvis was performed using the standard protocol following bolus administration of intravenous contrast. RADIATION DOSE REDUCTION: This exam was performed according to the departmental dose-optimization program which includes automated exposure control, adjustment of the mA and/or kV according to patient size and/or use of iterative reconstruction technique. CONTRAST:  OMNIPAQUE  IOHEXOL  300 MG/ML  SOLN COMPARISON:  MRI lumbar spine 05/24/2019 FINDINGS: Lower chest: Small hiatal hernia. Coronary artery calcifications. No acute abnormality. Hepatobiliary: No focal liver abnormality. No gallstones, gallbladder wall thickening, or pericholecystic fluid. No biliary dilatation.  Pancreas: Diffusely atrophic. No focal lesion. Otherwise normal pancreatic contour. No surrounding inflammatory changes. No main pancreatic ductal dilatation. Spleen: Normal in size without focal abnormality. Adrenals/Urinary Tract: No adrenal nodule bilaterally. Delayed right nephrogram. Atrophic right kidney with renal cortical scarring. Right urothelial thickening. Question right renal interpolar mass measuring 2.5 cm. No hydronephrosis. No hydroureter.  No nephroureterolithiasis. Urinary bladder wall thickening. On delayed imaging, there is no urothelial wall thickening and there are no filling defects in the opacified portions of the left collecting system or ureter. Stomach/Bowel: Stomach is within normal limits. No evidence of bowel wall thickening or dilatation. Colonic diverticulosis. The appendix is not definitely identified with no inflammatory changes in the right lower quadrant to suggest acute appendicitis. Vascular/Lymphatic: No abdominal aorta or iliac aneurysm. Severe atherosclerotic plaque of the aorta and its branches. 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 No abdominal, pelvic, or inguinal lymphadenopathy. Reproductive: Pessary device noted. Uterus and bilateral adnexa are unremarkable. Other: No intraperitoneal free fluid. No intraperitoneal free gas. No organized fluid collection. Musculoskeletal: No abdominal wall hernia or abnormality. No suspicious lytic or blastic osseous lesions. No acute displaced fracture. Multilevel degenerative changes of the spine. Age-indeterminate superior endplate L2 compression fracture with at least 40% vertebral body height loss. Mild to moderate right hip degenerative changes. IMPRESSION: 1. Cystitis with right urothelial thickening suggestive of ascending infection-correlate for with urinalysis for infection. 2. Question 2.5 cm right renal interpolar mass. When the patient is clinically stable and able to  follow directions and hold their breath (preferably as an outpatient) further evaluation with dedicated MRI renal  protocol should be considered. 3. Small hiatal hernia. 4. Colonic diverticulosis with no acute diverticulitis. 5. 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 Electronically Signed   By: Morgane  Naveau M.D.   On: 10/17/2023 23:44   DG Lumbar Spine Complete Result Date: 10/17/2023 CLINICAL DATA:  Multiple falls, pain EXAM: LUMBAR SPINE - COMPLETE 4+ VIEW COMPARISON:  09/03/2019 FINDINGS: Frontal, bilateral oblique, lateral views of the lumbar spine are obtained. There are 5 non-rib-bearing lumbar type vertebral bodies in normal anatomic alignment. There is an age indeterminate compression deformity involving the superior endplate of the L2 vertebral body, with approximately 10% loss of vertebral body height. This is new since 2021, with no interval studies available for comparison. No other acute bony abnormalities. There is diffuse lumbar facet hypertrophy. Mild diffuse spondylosis, greatest at the thoracolumbar junction. Sacroiliac joints appear unremarkable. IMPRESSION: 1. Age indeterminate compression deformity within the superior endplate of the L2 vertebral body, with approximately 10% loss of vertebral body height. This is new since the 2021 comparison study, with no interval exams available for comparison. Please correlate with site of patient's pain, as this could reflect sequela of recent trauma. 2. Diffuse lumbar spondylosis and facet hypertrophy as above. Electronically Signed   By: Bobbye Burrow M.D.   On: 10/17/2023 17:03   DG Knee Complete 4 Views Right Result Date: 10/17/2023 CLINICAL DATA:  Multiple falls, knee pain EXAM: RIGHT KNEE - COMPLETE 4+ VIEW COMPARISON:  None Available. FINDINGS: Frontal, bilateral oblique, and cross-table lateral views of the right knee are obtained on 4 images. Three component right knee  arthroplasty is identified in the expected position without evidence of acute complication. No acute displaced fractures. Small suprapatellar joint effusion. Soft tissues are unremarkable. IMPRESSION: 1. Unremarkable right knee arthroplasty. 2. Small joint effusion. 3. No acute fracture. Electronically Signed   By: Bobbye Burrow M.D.   On: 10/17/2023 17:00   CT Head Wo Contrast Result Date: 10/17/2023 CLINICAL DATA:  Vertigo, central. EXAM: CT HEAD WITHOUT CONTRAST TECHNIQUE: Contiguous axial images were obtained from the base of the skull through the vertex without intravenous contrast. RADIATION DOSE REDUCTION: This exam was performed according to the departmental dose-optimization program which includes automated exposure control, adjustment of the mA and/or kV according to patient size and/or use of iterative reconstruction technique. COMPARISON:  Head CT 05/12/2023 FINDINGS: Brain: There is no evidence of an acute infarct, intracranial hemorrhage, mass, midline shift, or extra-axial fluid collection. There is mild cerebral atrophy. Patchy hypodensities in the cerebral white matter bilaterally are unchanged and nonspecific but compatible with mild-to-moderate chronic small vessel ischemic disease. Vascular: Calcified atherosclerosis at the skull base. No hyperdense vessel. Skull: No fracture or suspicious lesion. Sinuses/Orbits: Visualized paranasal sinuses and mastoid air cells are clear. Bilateral cataract extraction. Other: None. IMPRESSION: 1. No evidence of acute intracranial abnormality. 2. Mild-to-moderate chronic small vessel ischemic disease. Electronically Signed   By: Aundra Lee M.D.   On: 10/17/2023 16:22        Scheduled Meds:  allopurinol   100 mg Oral Daily   apixaban   5 mg Oral BID   ezetimibe   10 mg Oral QHS   insulin  aspart  0-5 Units Subcutaneous QHS   insulin  aspart  0-9 Units Subcutaneous TID WC   losartan   100 mg Oral Daily   metoprolol  succinate  25 mg Oral Daily    nortriptyline   25 mg Oral QHS   Continuous Infusions:  cefTRIAXone  (ROCEPHIN )  IV 1 g (10/19/23 1146)  LOS: 0 days    Time spent: 45 minutes spent on chart review, discussion with nursing staff, consultants, updating family and interview/physical exam; more than 50% of that time was spent in counseling and/or coordination of care.    Enrigue Harvard, DO [facility] Available via Epic secure chat 7am-7pm After these hours, please refer to coverage provider listed on [email or web address] 10/19/2023, 12:33 PM

## 2023-10-20 DIAGNOSIS — R531 Weakness: Secondary | ICD-10-CM | POA: Diagnosis not present

## 2023-10-20 LAB — BASIC METABOLIC PANEL WITH GFR
Anion gap: 8 (ref 5–15)
BUN: 23 mg/dL (ref 8–23)
CO2: 25 mmol/L (ref 22–32)
Calcium: 8.5 mg/dL — ABNORMAL LOW (ref 8.9–10.3)
Chloride: 100 mmol/L (ref 98–111)
Creatinine, Ser: 0.84 mg/dL (ref 0.44–1.00)
GFR, Estimated: >60 mL/min (ref >60)
Glucose, Bld: 120 mg/dL — ABNORMAL HIGH (ref 70–99)
Potassium: 3.3 mmol/L — ABNORMAL LOW (ref 3.5–5.1)
Sodium: 133 mmol/L — ABNORMAL LOW (ref 135–145)

## 2023-10-20 LAB — GLUCOSE, CAPILLARY
Glucose-Capillary: 93 mg/dL (ref 70–99)
Glucose-Capillary: 145 mg/dL — ABNORMAL HIGH (ref 70–99)
Glucose-Capillary: 113 mg/dL — ABNORMAL HIGH (ref 70–99)
Glucose-Capillary: 163 mg/dL — ABNORMAL HIGH (ref 70–99)

## 2023-10-20 LAB — CBC
HCT: 35.4 % — ABNORMAL LOW (ref 36.0–46.0)
Hemoglobin: 11.5 g/dL — ABNORMAL LOW (ref 12.0–15.0)
MCH: 31.8 pg (ref 26.0–34.0)
MCHC: 32.5 g/dL (ref 30.0–36.0)
MCV: 97.8 fL (ref 80.0–100.0)
Platelets: 212 10*3/uL (ref 150–400)
RBC: 3.62 MIL/uL — ABNORMAL LOW (ref 3.87–5.11)
RDW: 13.7 % (ref 11.5–15.5)
WBC: 7.9 10*3/uL (ref 4.0–10.5)
nRBC: 0 % (ref 0.0–0.2)

## 2023-10-20 MED ORDER — RISAQUAD PO CAPS
2.0000 | ORAL_CAPSULE | Freq: Every day | ORAL | Status: DC
  Administered 2023-10-20 – 2023-10-22 (×3): 2 via ORAL
  Filled 2023-10-20 (×3): qty 2

## 2023-10-20 MED ORDER — SODIUM CHLORIDE 0.9 % IV SOLN: INTRAVENOUS | Status: DC

## 2023-10-20 MED ORDER — POTASSIUM CHLORIDE CRYS ER 20 MEQ PO TBCR
40.0000 meq | EXTENDED_RELEASE_TABLET | Freq: Once | ORAL | Status: AC
  Administered 2023-10-20: 40 meq via ORAL
  Filled 2023-10-20: qty 2

## 2023-10-20 NOTE — Plan of Care (Signed)

## 2023-10-20 NOTE — TOC Progression Note (Signed)
 Transition of Care [facility]) - Progression Note   Patient Details  Name: [name] MRN: [id number] Date of Birth: 07-19-1940  Transition of Care [facility]) CM/SW Contact  Zenon Hilda, LCSW Phone Number: 10/20/2023, 2:09 PM  Clinical Narrative: HHPT/OT referral has been accepted by [facility]. HH orders requested. CSW updated spouse regarding HH acceptance. TOC to follow.  Expected Discharge Plan: Home w Home Health Services Barriers to Discharge: Continued Medical Work up  Expected Discharge Plan and Services In-house Referral: Clinical Social Work Post Acute Care Choice: Home Health Living arrangements for the past 2 months: Single Family Home           DME Arranged: N/A DME Agency: NA HH Agency: Other - See comment Lennart Quitter) Date HH Agency Contacted: 10/19/23 Representative spoke with at [facility] Agency: Melissa  Social Determinants of Health (SDOH) Interventions SDOH Screenings   Food Insecurity: No Food Insecurity (10/18/2023)  Housing: Low Risk  (10/18/2023)  Transportation Needs: No Transportation Needs (10/18/2023)  Utilities: Not At Risk (10/18/2023)  Depression (PHQ2-9): Low Risk  (10/17/2022)  Social Connections: Moderately Integrated (10/18/2023)  Tobacco Use: Low Risk  (10/17/2023)   Readmission Risk Interventions    05/20/2022    3:36 PM  Readmission Risk Prevention Plan  Post Dischage Appt Complete  Medication Screening Complete  Transportation Screening Complete

## 2023-10-20 NOTE — Progress Notes (Signed)
 Physical Therapy Treatment Patient Details Name: [name] MRN: [id number] DOB: [birth year] Today's Date: 10/20/2023   History of Present Illness [name] is a 83 y.o. female reports with fatigue, diarrhea, nausea, dizziness, falls. PMH: hypertension, hyperlipidemia, permanent A-fib on Eliquis , CVA, type 2 diabetes, neuropathy, large cell lymphoma status post chemo, uterine prolapse with spastic bladder and recurrent UTIs, GERD    PT Comments  Pt received in bed, supportive family at bedside. Discussed current LOF, baseline, and home set up for safe transition home. Right knee slowly improving. No c/o LBP with log roll and side to sit on EOB. Pt initially with c/o dizziness, VSS sitting and standing with dizziness resolving after prolonged sitting. Improved tolerance for mobility, requiring CGA to stand at RW from bed and ambulate short distance to bedside chair. Limited distance due to leads/lines. Overall great progress, pt encouraged to use BSC instead of purewick to increase daily activity. Initial recs for HHPT upon d/c remain appropriate. Pt has a very supportive family and Rollator at home. Will continue to progress acutely.    If plan is discharge home, recommend the following: A little help with walking and/or transfers;A little help with bathing/dressing/bathroom;Assist for transportation;Assistance with cooking/housework;Help with stairs or ramp for entrance   Can travel by private vehicle        Equipment Recommendations  None recommended by PT    Recommendations for Other Services       Precautions / Restrictions Precautions Precautions: Fall Recall of Precautions/Restrictions: Intact Restrictions Weight Bearing Restrictions Per Provider Order: No     Mobility  Bed Mobility Overal bed mobility: Needs Assistance Bed Mobility: Rolling, Sidelying to Sit, Sit to Sidelying Rolling: Min assist Sidelying to sit: Min assist, Contact guard assist        General bed mobility comments: Pt and family educated on log roll technique for comfort    Transfers Overall transfer level: Needs assistance Equipment used: Rolling walker (2 wheels) Transfers: Sit to/from Stand Sit to Stand: Min assist, Contact guard assist           General transfer comment: Several sit<>stand transfers with CG/MinA at [facility]    Ambulation/Gait Ambulation/Gait assistance: Contact guard assist Gait Distance (Feet): 5 Feet Assistive device: Rolling walker (2 wheels) Gait Pattern/deviations: Step-to pattern, Decreased step length - right, Decreased step length - left Gait velocity: decr     General Gait Details: Pt able to ambulate bed to chair with CGA for safety.   Stairs             Wheelchair Mobility     Tilt Bed    Modified Rankin (Stroke Patients Only)       Balance Overall balance assessment: Needs assistance, History of Falls Sitting-balance support: Bilateral upper extremity supported, Feet supported Sitting balance-Leahy Scale: Fair     Standing balance support: Bilateral upper extremity supported, During functional activity, Reliant on assistive device for balance Standing balance-Leahy Scale: Fair Standing balance comment: Pt uses Rollator at baseline                            Communication Communication Communication: No apparent difficulties  Cognition Arousal: Alert Behavior During Therapy: WFL for tasks assessed/performed   PT - Cognitive impairments: No apparent impairments                       PT - Cognition Comments: Very pleasant and cooperative Following commands: Intact  Cueing Cueing Techniques: Verbal cues, Tactile cues  Exercises General Exercises - Lower Extremity Ankle Circles/Pumps: AROM, Both, 5 reps Long Arc Quad: AROM, Both, 5 reps Heel Slides: AROM, Both, 5 reps    General Comments General comments (skin integrity, edema, etc.): Purewick present and intact, educated  pt and nursing on removing and using BSC to progress function. Right knee remains edematous and bruised. Ice applied post session      Pertinent Vitals/Pain Pain Assessment Pain Assessment: Faces Faces Pain Scale: Hurts a little bit Pain Location: back and RLE Pain Descriptors / Indicators: Aching, Discomfort Pain Intervention(s): Monitored during session, Ice applied    Home Living                          Prior Function            PT Goals (current goals can now be found in the care plan section) Acute Rehab PT Goals Patient Stated Goal: return home, HHPT/OT Progress towards PT goals: Progressing toward goals    Frequency    Min 3X/week      PT Plan      Co-evaluation              AM-PAC PT "6 Clicks" Mobility   Outcome Measure  Help needed turning from your back to your side while in a flat bed without using bedrails?: A Little Help needed moving from lying on your back to sitting on the side of a flat bed without using bedrails?: A Little Help needed moving to and from a bed to a chair (including a wheelchair)?: A Little Help needed standing up from a chair using your arms (e.g., wheelchair or bedside chair)?: A Little Help needed to walk in hospital room?: A Little Help needed climbing 3-5 steps with a railing? : A Lot 6 Click Score: 17    End of Session Equipment Utilized During Treatment: Gait belt Activity Tolerance: Patient tolerated treatment well Patient left: in chair;with call bell/phone within reach;with family/visitor present Nurse Communication: Mobility status;Other (comment) (? d/c purewick) PT Visit Diagnosis: Unsteadiness on feet (R26.81);Muscle weakness (generalized) (M62.81);History of falling (Z91.81);Difficulty in walking, not elsewhere classified (R26.2);Pain Pain - Right/Left: Right Pain - part of body: Knee     Time: 9604-5409 PT Time Calculation (min) (ACUTE ONLY): 41 min  Charges:    $Gait Training: 8-22  mins $Therapeutic Exercise: 8-22 mins $Therapeutic Activity: 8-22 mins PT General Charges $$ ACUTE PT VISIT: 1 Visit                    Melvyn Stagers, PTA  Diona Franklin 10/20/2023, 4:54 PM

## 2023-10-20 NOTE — Progress Notes (Signed)
 PROGRESS NOTE    [name]  ZOX:[id number] DOB: December 06, 1940 DOA: 10/17/2023 PCP: Pete Brand, DO    Brief Narrative:  [name] is a 83 y.o. female with medical history significant of hypertension, hyperlipidemia, permanent A-fib on Eliquis , CVA, type 2 diabetes, neuropathy, large cell lymphoma status post chemo, uterine prolapse with spastic bladder and recurrent UTIs, GERD presenting with a complaint of generalized weakness.  History provided by the patient and her daughter at bedside.  On Easter, patient tripped over carpet and fell injuring her right knee.  Then 2 days ago she had another fall due to generalized weakness/losing balance.  Since after her 2 recent falls, she is having pain in her lower back.  She saw orthopedics yesterday for evaluation of her low back pain and right knee pain and was sent to the ED for further evaluation. Had diarrhea overnight   Assessment and Plan: Generalized weakness/recurrent falls Patient denies any episodes of syncope and daughter confirms.  EKG showing rate controlled A-fib and no acute ischemic changes.  Troponin mildly elevated but stable and not consistent with ACS.  Patient is not endorsing chest pain.  CT head negative for acute intracranial abnormality and no focal neurodeficit on exam.  No seizure-like activity reported.  Patient does endorse slight lightheadedness.  Will continue metoprolol  for A-fib but hold other antihypertensives at this time until orthostatics checked.  Gentle IV fluid hydration.  PT/OT eval, fall precautions. -echo:Left ventricular ejection fraction, by estimation, is 45 to 50%.  The left ventricle has mildly decreased function. The left ventricle  demonstrates global hypokinesis.  Orthostatics negative -patient appears dry-- IVF   Diarrhea -stool studies ordered  Mild hyponatremia Stable   Mild hypokalemia QT prolongation Repleted   Mild elevation of total bilirubin CT showing no acute  hepatobiliary abnormality.  Monitor LFTs.  Patient  is currently asymptomatic   Possible UTI Patient with history of uterine prolapse with spastic bladder and recurrent UTIs/CRE Kleb pneumo colonization.  She is endorsing urinary frequency/urgency, dysuria, and suprapubic discomfort.  CT showing cystitis with right urothelial thickening suggestive of ascending infection.  - Not improved off antibiotics we will start Rocephin  although may need to expand antibiotics based on prior cultures - Culture pending- ecoli   L2 compression fracture Patient is endorsing lower back pain in the setting of recent falls and x-ray showing age-indeterminate L2 compression fracture, new since 2021 comparison study.  Continue pain management.   Incidental renal mass CT showing a questionable 2.5 cm right renal interpolar mass for which radiologist is recommending further evaluation with outpatient dedicated MRI renal protocol.     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  Patient is not endorsing epigastric abdominal pain, nausea, or vomiting.  Outpatient follow-up for further evaluation.   Hypertension Continue metoprolol  but holding other antihypertensives at this time until orthostatics checked.   Hyperlipidemia Continue Zetia .   Permanent A-fib Currently rate controlled.  Continue metoprolol  and Eliquis .   History of CVA Continue Eliquis  and Zetia .   Type 2 diabetes -Placed on sensitive sliding scale insulin  ACHS.  Hold metformin  given diarrhea.   Chronic normocytic anemia Hemoglobin at baseline, monitor labs.   Gout Continue allopurinol .   Mood disorder Continue nortriptyline .   DVT prophylaxis:  apixaban  (ELIQUIS ) tablet 5 mg    Code Status: Full Code Family Communication: daughter at bedside  Disposition Plan:  Level of care: Med-Surg Status is: Observation     Consultants:  None  Subjective: Asking about getting oob Still not feeling well  Objective: Vitals:   10/19/23 1315 10/19/23 2109 10/20/23 0604 10/20/23 1204  BP: (!) 144/93 (!) 130/92 (!) 154/84 (!) 148/92  Pulse: 71 85 81 70  Resp: 18 18 20 17   Temp: 98.7 F (37.1 C) 97.8 F (36.6 C) 98 F (36.7 C) 97.9 F (36.6 C)  TempSrc: Oral Oral Oral Oral  SpO2: 97% 95% 98% 95%  Weight:      Height:        Intake/Output Summary (Last 24 hours) at 10/20/2023 1330 Last data filed at 10/19/2023 1708 Gross per 24 hour  Intake --  Output 400 ml  Net -400 ml   Filed Weights   10/17/23 1441  Weight: 65.8 kg    Examination:   General: Appearance:    Mildly obese female in no acute distress     Lungs:     respirations unlabored  Heart:    Normal heart rate.    MS:   All extremities are intact.    Neurologic:   Awake, alert       Data Reviewed: I have personally reviewed following labs and imaging studies  CBC: Recent Labs  Lab 10/17/23 1658 10/18/23 0304 10/19/23 0508  WBC 8.3 6.6 8.5  NEUTROABS 6.6  --  5.2  HGB 11.3* 10.6* 11.4*  HCT 34.9* 31.8* 34.7*  MCV 99.7 98.1 96.9  PLT 220 189 188   Basic Metabolic Panel: Recent Labs  Lab 10/17/23 1658 10/18/23 0304 10/19/23 0508  NA 133* 133* 133*  K 3.4* 3.8 3.7  CL 97* 103 100  CO2 25 23 21*  GLUCOSE 141* 129* 107*  BUN 20 16 16   CREATININE 0.95 0.85 0.92  CALCIUM  8.8* 8.1* 8.4*  MG 1.8  --   --   PHOS  --  2.6  --    GFR: Estimated Creatinine Clearance: 36.8 mL/min (by C-G formula based on SCr of 0.92 mg/dL). Liver Function Tests: Recent Labs  Lab 10/17/23 1658 10/18/23 0304 10/19/23 0508  AST 32 42* 59*  ALT 31 39 56*  ALKPHOS 77 69 66  BILITOT 2.8* 2.5* 2.1*  PROT 6.9 6.3* 6.5  ALBUMIN 3.3* 3.0* 3.0*   Recent Labs  Lab 10/17/23 1658  LIPASE 27   No results for input(s): "AMMONIA" in the last 168 hours. Coagulation Profile: No results for input(s): "INR", "PROTIME" in the last 168 hours. Cardiac  Enzymes: No results for input(s): "CKTOTAL", "CKMB", "CKMBINDEX", "TROPONINI" in the last 168 hours. BNP (last 3 results) No results for input(s): "PROBNP" in the last 8760 hours. HbA1C: Recent Labs    10/18/23 0304  HGBA1C 5.7*   CBG: Recent Labs  Lab 10/19/23 1122 10/19/23 1617 10/19/23 2107 10/20/23 0722 10/20/23 1205  GLUCAP 133* 150* 125* 93 145*   Lipid Profile: No results for input(s): "CHOL", "HDL", "LDLCALC", "TRIG", "CHOLHDL", "LDLDIRECT" in the last 72 hours. Thyroid Function Tests: No results for input(s): "TSH", "T4TOTAL", "FREET4", "T3FREE", "THYROIDAB" in the last 72 hours. Anemia Panel: No results for input(s): "VITAMINB12", "FOLATE", "FERRITIN", "TIBC", "IRON", "RETICCTPCT" in the last 72 hours. Sepsis Labs: No results for input(s): "PROCALCITON", "LATICACIDVEN" in the last 168 hours.  Recent Results (from the past 240 hours)  Urine Culture     Status: Abnormal (Preliminary result)   Collection Time: 10/18/23  6:11 PM   Specimen: Urine, Random  Result Value Ref Range Status   Specimen Description   Final    URINE, RANDOM Performed at  [facility], [address], [place], [place] [address]    Special Requests   Final    NONE Reflexed from [id number] Performed at [facility], [address], [place], [place] [address]    Culture (A)  Final    >=100,000 COLONIES/mL ESCHERICHIA COLI SUSCEPTIBILITIES TO FOLLOW Performed at [facility], [address], [place], [place] [address]    Report Status PENDING  Incomplete         Radiology Studies: ECHOCARDIOGRAM COMPLETE Result Date: 10/18/2023    ECHOCARDIOGRAM REPORT   Patient Name:   Jamar M [facility] Date of Exam: 10/18/2023 Medical Rec #:  [id number]           Height:       57.0 in Accession #:    [id number]          Weight:       145.0 lb Date of Birth:  1941/05/13           BSA:          1.569 m Patient Age:    82 years            BP:           172/101 mmHg  Patient Gender: F                   HR:           84 bpm. Exam Location:  Inpatient Procedure: 2D Echo, Cardiac Doppler and Color Doppler (Both Spectral and Color            Flow Doppler were utilized during procedure). Indications:    Generalized weakness  History:        Patient has prior history of Echocardiogram examinations, most                 recent 02/17/2015. Abnormal ECG, Stroke, Arrythmias:Atrial                 Fibrillation and Atrial Flutter; Risk Factors:Dyslipidemia,                 Hypertension and Diabetes.  Sonographer:    Juanita Shaw Referring Phys: [id number] VASUNDHRA RATHORE IMPRESSIONS  1. Systolic function varies with each beat, but overall appears mildly reduced. Left ventricular ejection fraction, by estimation, is 45 to 50%. The left ventricle has mildly decreased function. The left ventricle demonstrates global hypokinesis.  2. Right ventricular systolic function is low normal. The right ventricular size is normal. There is moderately elevated pulmonary artery systolic pressure.  3. Left atrial size was severely dilated.  4. The mitral valve is normal in structure. Trivial mitral valve regurgitation. No evidence of mitral stenosis.  5. The aortic valve is tricuspid. Aortic valve regurgitation is not visualized. No aortic stenosis is present.  6. The inferior vena cava is dilated in size with <50% respiratory variability, suggesting right atrial pressure of 15 mmHg. FINDINGS  Left Ventricle: Systolic function varies with each beat, but overall appears mildly reduced. Left ventricular ejection fraction, by estimation, is 45 to 50%. The left ventricle has mildly decreased function. The left ventricle demonstrates global hypokinesis. The left ventricular internal cavity size was normal in size. There is no left ventricular hypertrophy. Left ventricular diastolic function could not be evaluated due to atrial fibrillation. Indeterminate filling pressures. Right Ventricle: The right ventricular  size is normal. No increase in right ventricular wall thickness. Right ventricular systolic function is low normal. There  is moderately elevated pulmonary artery systolic pressure. The tricuspid regurgitant velocity  is 3.09 m/s, and with an assumed right atrial pressure of 15 mmHg, the estimated right ventricular systolic pressure is 53.2 mmHg. Left Atrium: Left atrial size was severely dilated. Right Atrium: Right atrial size was normal in size. Pericardium: There is no evidence of pericardial effusion. Mitral Valve: The mitral valve is normal in structure. Trivial mitral valve regurgitation. No evidence of mitral valve stenosis. MV peak gradient, 3.1 mmHg. The mean mitral valve gradient is 1.0 mmHg. Tricuspid Valve: The tricuspid valve is normal in structure. Tricuspid valve regurgitation is trivial. No evidence of tricuspid stenosis. Aortic Valve: The aortic valve is tricuspid. Aortic valve regurgitation is not visualized. No aortic stenosis is present. Aortic valve mean gradient measures 3.0 mmHg. Aortic valve peak gradient measures 4.9 mmHg. Aortic valve area, by VTI measures 1.69 cm. Pulmonic Valve: The pulmonic valve was normal in structure. Pulmonic valve regurgitation is trivial. No evidence of pulmonic stenosis. Aorta: The aortic root is normal in size and structure. Venous: The inferior vena cava is dilated in size with less than 50% respiratory variability, suggesting right atrial pressure of 15 mmHg. IAS/Shunts: No atrial level shunt detected by color flow Doppler.  LEFT VENTRICLE PLAX 2D LVIDd:         3.90 cm     Diastology LVIDs:         2.40 cm     LV e' medial:    6.53 cm/s LV PW:         1.00 cm     LV E/e' medial:  11.8 LV IVS:        0.80 cm     LV e' lateral:   5.87 cm/s LVOT diam:     1.80 cm     LV E/e' lateral: 13.1 LV SV:         28 LV SV Index:   18 LVOT Area:     2.54 cm  LV Volumes (MOD) LV vol d, MOD A2C: 65.3 ml LV vol d, MOD A4C: 78.2 ml LV vol s, MOD A2C: 28.8 ml LV vol s, MOD  A4C: 32.1 ml LV SV MOD A2C:     36.5 ml LV SV MOD A4C:     78.2 ml LV SV MOD BP:      40.5 ml RIGHT VENTRICLE            IVC RV Basal diam:  3.50 cm    IVC diam: 2.00 cm RV Mid diam:    2.80 cm RV S prime:     7.07 cm/s TAPSE (M-mode): 1.4 cm LEFT ATRIUM             Index        RIGHT ATRIUM           Index LA diam:        3.90 cm 2.49 cm/m   RA Area:     13.10 cm LA Vol (A2C):   71.4 ml 45.51 ml/m  RA Volume:   27.90 ml  17.78 ml/m LA Vol (A4C):   54.3 ml 34.61 ml/m LA Biplane Vol: 62.1 ml 39.58 ml/m  AORTIC VALVE                    PULMONIC VALVE AV Area (Vmax):    1.37 cm     PV Vmax:          0.72 m/s AV Area (Vmean):   1.18 cm  PV Peak grad:     2.1 mmHg AV Area (VTI):     1.69 cm     PR End Diast Vel: 1.59 msec AV Vmax:           110.67 cm/s AV Vmean:          82.300 cm/s AV VTI:            0.164 m AV Peak Grad:      4.9 mmHg AV Mean Grad:      3.0 mmHg LVOT Vmax:         59.40 cm/s LVOT Vmean:        38.300 cm/s LVOT VTI:          0.109 m LVOT/AV VTI ratio: 0.67  AORTA Ao Root diam: 3.60 cm Ao Asc diam:  3.63 cm MITRAL VALVE               TRICUSPID VALVE MV Area (PHT): 4.36 cm    TR Peak grad:   38.2 mmHg MV Area VTI:   1.68 cm    TR Vmax:        309.00 cm/s MV Peak grad:  3.1 mmHg MV Mean grad:  1.0 mmHg    SHUNTS MV Vmax:       0.88 m/s    Systemic VTI:  0.11 m MV Vmean:      54.6 cm/s   Systemic Diam: 1.80 cm MV Decel Time: 174 msec MV E velocity: 77.10 cm/s Maudine Sos MD Electronically signed by Maudine Sos MD Signature Date/Time: 10/18/2023/7:12:02 PM    Final         Scheduled Meds:  acidophilus  2 capsule Oral Daily   allopurinol   100 mg Oral Daily   apixaban   5 mg Oral BID   ezetimibe   10 mg Oral QHS   insulin  aspart  0-5 Units Subcutaneous QHS   insulin  aspart  0-9 Units Subcutaneous TID WC   losartan   100 mg Oral Daily   metoprolol  succinate  25 mg Oral Daily   nortriptyline   25 mg Oral QHS   Continuous Infusions:  sodium chloride  75 mL/hr at 10/20/23 1058    cefTRIAXone  (ROCEPHIN )  IV 1 g (10/20/23 1100)     LOS: 0 days    Time spent: 45 minutes spent on chart review, discussion with nursing staff, consultants, updating family and interview/physical exam; more than 50% of that time was spent in counseling and/or coordination of care.    Enrigue Harvard, DO [facility] Available via Epic secure chat 7am-7pm After these hours, please refer to coverage provider listed on [email or web address] 10/20/2023, 1:30 PM

## 2023-10-21 ENCOUNTER — Other Ambulatory Visit

## 2023-10-21 DIAGNOSIS — R531 Weakness: Secondary | ICD-10-CM | POA: Diagnosis not present

## 2023-10-21 LAB — BASIC METABOLIC PANEL WITH GFR
Anion gap: 10 (ref 5–15)
BUN: 19 mg/dL (ref 8–23)
CO2: 21 mmol/L — ABNORMAL LOW (ref 22–32)
Calcium: 8.5 mg/dL — ABNORMAL LOW (ref 8.9–10.3)
Chloride: 107 mmol/L (ref 98–111)
Creatinine, Ser: 0.76 mg/dL (ref 0.44–1.00)
GFR, Estimated: >60 mL/min (ref >60)
Glucose, Bld: 121 mg/dL — ABNORMAL HIGH (ref 70–99)
Potassium: 3.7 mmol/L (ref 3.5–5.1)
Sodium: 138 mmol/L (ref 135–145)

## 2023-10-21 LAB — CBC
HCT: 36.3 % (ref 36.0–46.0)
Hemoglobin: 11.3 g/dL — ABNORMAL LOW (ref 12.0–15.0)
MCH: 32.3 pg (ref 26.0–34.0)
MCHC: 31.1 g/dL (ref 30.0–36.0)
MCV: 103.7 fL — ABNORMAL HIGH (ref 80.0–100.0)
Platelets: 219 10*3/uL (ref 150–400)
RBC: 3.5 MIL/uL — ABNORMAL LOW (ref 3.87–5.11)
RDW: 14 % (ref 11.5–15.5)
WBC: 7.5 10*3/uL (ref 4.0–10.5)
nRBC: 0 % (ref 0.0–0.2)

## 2023-10-21 LAB — GASTROINTESTINAL PANEL BY PCR, STOOL (REPLACES STOOL CULTURE)

## 2023-10-21 LAB — GLUCOSE, CAPILLARY
Glucose-Capillary: 112 mg/dL — ABNORMAL HIGH (ref 70–99)
Glucose-Capillary: 115 mg/dL — ABNORMAL HIGH (ref 70–99)
Glucose-Capillary: 120 mg/dL — ABNORMAL HIGH (ref 70–99)
Glucose-Capillary: 125 mg/dL — ABNORMAL HIGH (ref 70–99)

## 2023-10-21 LAB — COMPREHENSIVE METABOLIC PANEL WITH GFR
ALT: 55 U/L — ABNORMAL HIGH (ref 0–44)
AST: 45 U/L — ABNORMAL HIGH (ref 15–41)
Albumin: 1.9 g/dL — ABNORMAL LOW (ref 3.5–5.0)
Alkaline Phosphatase: 49 U/L (ref 38–126)
Anion gap: 7 (ref 5–15)
BUN: 14 mg/dL (ref 8–23)
CO2: 17 mmol/L — ABNORMAL LOW (ref 22–32)
Calcium: 6.2 mg/dL — CL (ref 8.9–10.3)
Chloride: 117 mmol/L — ABNORMAL HIGH (ref 98–111)
Creatinine, Ser: 0.6 mg/dL (ref 0.44–1.00)
GFR, Estimated: >60 mL/min (ref >60)
Glucose, Bld: 73 mg/dL (ref 70–99)
Potassium: 3 mmol/L — ABNORMAL LOW (ref 3.5–5.1)
Sodium: 141 mmol/L (ref 135–145)
Total Bilirubin: 0.9 mg/dL (ref 0.0–1.2)
Total Protein: 4.3 g/dL — ABNORMAL LOW (ref 6.5–8.1)

## 2023-10-21 LAB — URINE CULTURE: Culture: >=100000 — AB

## 2023-10-21 LAB — MAGNESIUM: Magnesium: 1.5 mg/dL — ABNORMAL LOW (ref 1.7–2.4)

## 2023-10-21 MED ORDER — MAGNESIUM SULFATE 2 GM/50ML IV SOLN
2.0000 g | Freq: Once | INTRAVENOUS | Status: AC
  Administered 2023-10-21: 2 g via INTRAVENOUS
  Filled 2023-10-21: qty 50

## 2023-10-21 NOTE — Progress Notes (Addendum)
 PROGRESS NOTE    [name]  OZH:[id number] DOB: 1940-09-24 DOA: 10/17/2023 PCP: Pete Brand, DO    Brief Narrative:  [name] is a 83 y.o. female with medical history significant of hypertension, hyperlipidemia, permanent A-fib on Eliquis , CVA, type 2 diabetes, neuropathy, large cell lymphoma status post chemo, uterine prolapse with spastic bladder and recurrent UTIs, GERD presenting with a complaint of generalized weakness.  History provided by the patient and her daughter at bedside.  On Easter, patient tripped over carpet and fell injuring her right knee.  Then 2 days ago she had another fall due to generalized weakness/losing balance.  Since after her 2 recent falls, she is having pain in her lower back.  She saw orthopedics yesterday for evaluation of her low back pain and right knee pain and was sent to the ED for further evaluation. Continued diarrhea   Assessment and Plan: Generalized weakness/recurrent falls Patient denies any episodes of syncope and daughter confirms.  EKG showing rate controlled A-fib and no acute ischemic changes.  Troponin mildly elevated but stable and not consistent with ACS.  Patient is not endorsing chest pain.  CT head negative for acute intracranial abnormality and no focal neurodeficit on exam.  No seizure-like activity reported.  Patient does endorse slight lightheadedness.  Will continue metoprolol  for A-fib but hold other antihypertensives at this time until orthostatics checked.  Gentle IV fluid hydration.  PT/OT eval, fall precautions. -echo:Left ventricular ejection fraction, by estimation, is 45 to 50%.  The left ventricle has mildly decreased function. The left ventricle  demonstrates global hypokinesis.  Orthostatics negative -patient appears dry s/p IVF   Diarrhea -stool studies ordered  Mild hyponatremia Stable   Mild hypokalemia QT prolongation Repleted   Mild elevation of total bilirubin CT showing no acute  hepatobiliary abnormality.  Monitor LFTs.  Patient  is currently asymptomatic   UTI -concern for upper UTI (given ureteral involvement on imaging), tomorrow will plan to give 1g bid to complete the course.  - Culture pending- ecoli- pan sensitive   L2 compression fracture Patient is endorsing lower back pain in the setting of recent falls and x-ray showing age-indeterminate L2 compression fracture, new since 2021 comparison study.  Continue pain management.   Incidental renal mass CT showing a questionable 2.5 cm right renal interpolar mass for which radiologist is recommending further evaluation with outpatient dedicated MRI renal protocol.     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  Patient is not endorsing epigastric abdominal pain, nausea, or vomiting.  Outpatient follow-up for further evaluation.   Hypertension Continue metoprolol  but holding other antihypertensives at this time until orthostatics checked.   Hyperlipidemia Continue Zetia .   Permanent A-fib Currently rate controlled.  Continue metoprolol  and Eliquis .   History of CVA Continue Eliquis  and Zetia .   Type 2 diabetes -Placed on sensitive sliding scale insulin  ACHS.  Hold metformin  given diarrhea.   Chronic normocytic anemia Hemoglobin at baseline, monitor labs.   Gout Continue allopurinol .   Mood disorder Continue nortriptyline .   DVT prophylaxis:  apixaban  (ELIQUIS ) tablet 5 mg    Code Status: Full Code Family Communication: daughters at bedside  Disposition Plan:  Level of care: Med-Surg Status is: Observation     Consultants:  None  Subjective: Finally starting to feel better -labs drawn near IV site so not accurate Continues to have diarrhea  Objective: Vitals:   10/20/23 0604 10/20/23 1204 10/20/23 1928 10/21/23 0442  BP: Aaron Aas)  154/84 (!) 148/92 133/78 139/73  Pulse: 81 70 79 75  Resp: 20 17 17 20   Temp:  98 F (36.7 C) 97.9 F (36.6 C) 98.4 F (36.9 C) 97.8 F (36.6 C)  TempSrc: Oral Oral Oral Oral  SpO2: 98% 95% 98% 99%  Weight:      Height:        Intake/Output Summary (Last 24 hours) at 10/21/2023 1257 Last data filed at 10/21/2023 0433 Gross per 24 hour  Intake 1105.25 ml  Output 800 ml  Net 305.25 ml   Filed Weights   10/17/23 1441  Weight: 65.8 kg    Examination:   Sitting on bedside commode    Data Reviewed: I have personally reviewed following labs and imaging studies  CBC: Recent Labs  Lab 10/17/23 1658 10/18/23 0304 10/19/23 0508 10/20/23 1300 10/21/23 1127  WBC 8.3 6.6 8.5 7.9 7.5  NEUTROABS 6.6  --  5.2  --   --   HGB 11.3* 10.6* 11.4* 11.5* 11.3*  HCT 34.9* 31.8* 34.7* 35.4* 36.3  MCV 99.7 98.1 96.9 97.8 103.7*  PLT 220 189 188 212 219   Basic Metabolic Panel: Recent Labs  Lab 10/17/23 1658 10/18/23 0304 10/19/23 0508 10/20/23 1300 10/21/23 0740 10/21/23 1127  NA 133* 133* 133* 133* 141 138  K 3.4* 3.8 3.7 3.3* 3.0* 3.7  CL 97* 103 100 100 117* 107  CO2 25 23 21* 25 17* 21*  GLUCOSE 141* 129* 107* 120* 73 121*  BUN 20 16 16 23 14 19   CREATININE 0.95 0.85 0.92 0.84 0.60 0.76  CALCIUM  8.8* 8.1* 8.4* 8.5* 6.2* 8.5*  MG 1.8  --   --   --  1.5*  --   PHOS  --  2.6  --   --   --   --    GFR: Estimated Creatinine Clearance: 42.4 mL/min (by C-G formula based on SCr of 0.76 mg/dL). Liver Function Tests: Recent Labs  Lab 10/17/23 1658 10/18/23 0304 10/19/23 0508 10/21/23 0740  AST 32 42* 59* 45*  ALT 31 39 56* 55*  ALKPHOS 77 69 66 49  BILITOT 2.8* 2.5* 2.1* 0.9  PROT 6.9 6.3* 6.5 4.3*  ALBUMIN 3.3* 3.0* 3.0* 1.9*   Recent Labs  Lab 10/17/23 1658  LIPASE 27   No results for input(s): "AMMONIA" in the last 168 hours. Coagulation Profile: No results for input(s): "INR", "PROTIME" in the last 168 hours. Cardiac Enzymes: No results for input(s): "CKTOTAL", "CKMB", "CKMBINDEX", "TROPONINI" in the last 168 hours. BNP (last 3  results) No results for input(s): "PROBNP" in the last 8760 hours. HbA1C: No results for input(s): "HGBA1C" in the last 72 hours.  CBG: Recent Labs  Lab 10/20/23 1205 10/20/23 1719 10/20/23 2117 10/21/23 0821 10/21/23 1227  GLUCAP 145* 113* 163* 112* 115*   Lipid Profile: No results for input(s): "CHOL", "HDL", "LDLCALC", "TRIG", "CHOLHDL", "LDLDIRECT" in the last 72 hours. Thyroid Function Tests: No results for input(s): "TSH", "T4TOTAL", "FREET4", "T3FREE", "THYROIDAB" in the last 72 hours. Anemia Panel: No results for input(s): "VITAMINB12", "FOLATE", "FERRITIN", "TIBC", "IRON", "RETICCTPCT" in the last 72 hours. Sepsis Labs: No results for input(s): "PROCALCITON", "LATICACIDVEN" in the last 168 hours.  Recent Results (from the past 240 hours)  Urine Culture     Status: Abnormal   Collection Time: 10/18/23  6:11 PM   Specimen: Urine, Random  Result Value Ref Range Status   Specimen Description   Final    URINE, RANDOM Performed at [facility],  [address], [place], [place] [address]    Special Requests   Final    NONE Reflexed from [id number] Performed at [facility], [address], [place], [place] [address]    Culture >=100,000 COLONIES/mL ESCHERICHIA COLI (A)  Final   Report Status 10/21/2023 FINAL  Final   Organism ID, Bacteria ESCHERICHIA COLI (A)  Final      Susceptibility   Escherichia coli - MIC*    AMPICILLIN 4 SENSITIVE Sensitive     CEFAZOLIN  <=4 SENSITIVE Sensitive     CEFEPIME <=0.12 SENSITIVE Sensitive     CEFTRIAXONE  <=0.25 SENSITIVE Sensitive     CIPROFLOXACIN <=0.25 SENSITIVE Sensitive     GENTAMICIN <=1 SENSITIVE Sensitive     IMIPENEM <=0.25 SENSITIVE Sensitive     NITROFURANTOIN <=16 SENSITIVE Sensitive     TRIMETH/SULFA <=20 SENSITIVE Sensitive     AMPICILLIN/SULBACTAM <=2 SENSITIVE Sensitive     PIP/TAZO <=4 SENSITIVE Sensitive ug/mL    * >=100,000 COLONIES/mL ESCHERICHIA COLI          Radiology Studies: No results found.       Scheduled Meds:  acidophilus  2 capsule Oral Daily   allopurinol   100 mg Oral Daily   apixaban   5 mg Oral BID   ezetimibe   10 mg Oral QHS   insulin  aspart  0-5 Units Subcutaneous QHS   insulin  aspart  0-9 Units Subcutaneous TID WC   losartan   100 mg Oral Daily   metoprolol  succinate  25 mg Oral Daily   nortriptyline   25 mg Oral QHS   Continuous Infusions:  cefTRIAXone  (ROCEPHIN )  IV 1 g (10/21/23 1056)     LOS: 0 days    Time spent: 45 minutes spent on chart review, discussion with nursing staff, consultants, updating family and interview/physical exam; more than 50% of that time was spent in counseling and/or coordination of care.    Enrigue Harvard, DO [facility] Available via Epic secure chat 7am-7pm After these hours, please refer to coverage provider listed on [email or web address] 10/21/2023, 12:57 PM

## 2023-10-21 NOTE — Progress Notes (Signed)
 Mobility Specialist - Progress Note   10/21/23 1125  Mobility  Activity Stood at bedside  Level of Assistance Contact guard assist, steadying assist  Assistive Device Front wheel walker  Range of Motion/Exercises Active  Activity Response Tolerated well  Mobility Referral Yes  Mobility visit 1 Mobility  Mobility Specialist Start Time (ACUTE ONLY) 1115  Mobility Specialist Stop Time (ACUTE ONLY) 1125  Mobility Specialist Time Calculation (min) (ACUTE ONLY) 10 min   Pt was found on recliner chair and agreeable to mobilize. Pt able to complete the exercises below and x3 STS with no complaints. Grew fatigued and at EOS was left on recliner chair with all needs met. Call bell in reach and family in room.  Seated BLE Exercises: 10 reps each  1) Toe Raise/ Heel Raise  2) Knee Extension (hold 3 seconds)  3) Marching   Standing Exercises with UE support: 10 reps each   1) Marching     [name] Mobility Specialist

## 2023-10-22 DIAGNOSIS — R531 Weakness: Secondary | ICD-10-CM | POA: Diagnosis not present

## 2023-10-22 LAB — COMPREHENSIVE METABOLIC PANEL WITH GFR
ALT: 67 U/L — ABNORMAL HIGH (ref 0–44)
AST: 45 U/L — ABNORMAL HIGH (ref 15–41)
Albumin: 2.6 g/dL — ABNORMAL LOW (ref 3.5–5.0)
Alkaline Phosphatase: 70 U/L (ref 38–126)
Anion gap: 6 (ref 5–15)
BUN: 16 mg/dL (ref 8–23)
CO2: 25 mmol/L (ref 22–32)
Calcium: 8.5 mg/dL — ABNORMAL LOW (ref 8.9–10.3)
Chloride: 108 mmol/L (ref 98–111)
Creatinine, Ser: 0.74 mg/dL (ref 0.44–1.00)
GFR, Estimated: >60 mL/min (ref >60)
Glucose, Bld: 99 mg/dL (ref 70–99)
Potassium: 3.9 mmol/L (ref 3.5–5.1)
Sodium: 139 mmol/L (ref 135–145)
Total Bilirubin: 1 mg/dL (ref 0.0–1.2)
Total Protein: 6 g/dL — ABNORMAL LOW (ref 6.5–8.1)

## 2023-10-22 LAB — GLUCOSE, CAPILLARY
Glucose-Capillary: 101 mg/dL — ABNORMAL HIGH (ref 70–99)
Glucose-Capillary: 172 mg/dL — ABNORMAL HIGH (ref 70–99)

## 2023-10-22 MED ORDER — RISAQUAD PO CAPS: 2.0000 | ORAL_CAPSULE | Freq: Every day | ORAL | 0 refills | Status: AC

## 2023-10-22 MED ORDER — CEFADROXIL 500 MG PO CAPS: 1000.0000 mg | ORAL_CAPSULE | Freq: Two times a day (BID) | ORAL | 0 refills | Status: AC

## 2023-10-22 MED ORDER — CEFADROXIL 500 MG PO CAPS: 1000.0000 mg | ORAL_CAPSULE | Freq: Two times a day (BID) | ORAL | Status: DC

## 2023-10-22 NOTE — Discharge Summary (Signed)
 Physician Discharge Summary  [name] GMW:[id number] DOB: 1940/11/17 DOA: 10/17/2023  PCP: Pete Brand, DO  Admit date: 10/17/2023 Discharge date: 10/22/2023  Admitted From: home Discharge disposition: home   Recommendations for Outpatient Follow-Up:   Close urology follow up Home health Incidental renal mass: outpatient dedicated MRI renal protocol.     Discharge Diagnosis:   Principal Problem:   Generalized weakness Active Problems:   Diarrhea   Recurrent falls   Hyponatremia   Hypokalemia   Vertebral compression fracture (HCC)    Discharge Condition: Improved.  Diet recommendation: Low sodium, heart healthy.  Carbohydrate-modified.  Wound care: None.  Code status: Full.   History of Present Illness:   [name] is a 83 y.o. female with medical history significant of hypertension, hyperlipidemia, permanent A-fib on Eliquis , CVA, type 2 diabetes, neuropathy, large cell lymphoma status post chemo, uterine prolapse with spastic bladder and recurrent UTIs, GERD presenting with a complaint of generalized weakness.  History provided by the patient and her daughter at bedside.  On Easter, patient tripped over carpet and fell injuring her right knee.  Then 2 days ago she had another fall due to generalized weakness/losing balance.  Since after her 2 recent falls, she is having pain in her lower back.  She saw orthopedics yesterday for evaluation of her low back pain and right knee pain and was sent to the ED for further evaluation.  Patient reports having some lightheadedness but denies any episodes of syncope/loss of consciousness and daughter confirms.  Also denies chest pain, palpitations, or shortness of breath.  She is endorsing dysuria, urinary frequency and urgency, and lower abdominal/suprapubic discomfort.  Reports subjective fever at home.  Endorsing nonbloody diarrhea for the past 4 to 5 days but no nausea or vomiting.  Denies recent  antibiotic use.   ED Course: Vital signs on arrival: Temperature 98.3 F, pulse 90, respiratory rate 21, blood pressure 133/82, and SpO2 97% on room air.  Labs showing no leukocytosis, hemoglobin 11.3 (at baseline), sodium 133, potassium 3.4, chloride 97, glucose 141, creatinine 0.9, T. bili 2.8 and remainder of LFTs normal, lipase normal, troponin 31> 30, magnesium 1.8, C. difficile PCR and GI pathogen panel pending, UA pending.  CT head negative for acute intracranial abnormality.  X-ray of right knee showing unremarkable right knee arthroplasty, small suprapatellar joint effusion, and no acute fracture.  X-ray of lumbar spine showing age-indeterminate L2 compression fracture, new since 2021 comparison study.  CT abdomen pelvis showing cystitis with right urothelial thickening suggestive of ascending infection.  Also showing a questionable 2.5 cm right renal interpolar mass for which radiologist is recommending further evaluation with outpatient dedicated MRI renal protocol.  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  EKG showing A-fib, diffuse borderline T wave abnormalities, baseline wander in V5, LAFB, QTc 576.  No acute ischemic changes in comparison to previous EKG. Patient was given Zofran , IV potassium 10 mEq x 3, and 1 L normal saline.     Hospital Course by Problem:   Generalized weakness/recurrent falls Patient denies any episodes of syncope and daughter confirms.  EKG showing rate controlled A-fib and no acute ischemic changes.  Troponin mildly elevated but stable and not consistent with ACS.  Patient is not endorsing chest pain.  CT head negative for acute intracranial abnormality and no focal neurodeficit on exam.  No seizure-like activity reported.  Patient does endorse slight lightheadedness.  Will continue  metoprolol  for A-fib but hold other antihypertensives at this time until orthostatics checked.  Gentle IV  fluid hydration.  PT/OT eval, fall precautions. -echo:Left ventricular ejection fraction, by estimation, is 45 to 50%.  The left ventricle has mildly decreased function. The left ventricle  demonstrates global hypokinesis.  Orthostatics negative -better after treatment for UTI   Diarrhea -stool studies negative   Mild hyponatremia Stable   Mild hypokalemia QT prolongation Repleted   Mild elevation of total bilirubin CT showing no acute hepatobiliary abnormality.  Monitor LFTs.  Patient  is currently asymptomatic   UTI -concern for upper UTI (given ureteral involvement on imaging), tomorrow will plan to give 1g bid abx to complete the course.  - Culture pending- ecoli- pan sensitive   L2 compression fracture Patient is endorsing lower back pain in the setting of recent falls and x-ray showing age-indeterminate L2 compression fracture, new since 2021 comparison study.  Continue pain management.   Incidental renal mass CT showing a questionable 2.5 cm right renal interpolar mass for which radiologist is recommending further evaluation with outpatient dedicated MRI renal protocol.     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  Patient is not endorsing epigastric abdominal pain, nausea, or vomiting.  Outpatient follow-up for further evaluation.   Hypertension -resume home meds   Hyperlipidemia Continue Zetia .   Permanent A-fib Currently rate controlled.  Continue metoprolol  and Eliquis .   History of CVA Continue Eliquis  and Zetia .   Type 2 diabetes - Hold metformin  given diarrhea.   Chronic normocytic anemia Hemoglobin at baseline   Gout Continue allopurinol .   Mood disorder Continue nortriptyline .    Medical Consultants:      Discharge Exam:   Vitals:   10/21/23 2003 10/22/23 0428  BP: 120/79 (!) 162/92  Pulse: 67 68  Resp: 20 20  Temp: 97.7 F (36.5 C) 98.5 F (36.9 C)   SpO2: 98% 100%   Vitals:   10/20/23 1928 10/21/23 0442 10/21/23 2003 10/22/23 0428  BP: 133/78 139/73 120/79 (!) 162/92  Pulse: 79 75 67 68  Resp: 17 20 20 20   Temp: 98.4 F (36.9 C) 97.8 F (36.6 C) 97.7 F (36.5 C) 98.5 F (36.9 C)  TempSrc: Oral Oral Oral Oral  SpO2: 98% 99% 98% 100%  Weight:      Height:        General exam: Appears calm and comfortable.   The results of significant diagnostics from this hospitalization (including imaging, microbiology, ancillary and laboratory) are listed below for reference.     Procedures and Diagnostic Studies:   ECHOCARDIOGRAM COMPLETE Result Date: 10/18/2023    ECHOCARDIOGRAM REPORT   Patient Name:   [name] Date of Exam: 10/18/2023 Medical Rec #:  [id number]           Height:       57.0 in Accession #:    [id number]          Weight:       145.0 lb Date of Birth:  August 11, 1940           BSA:          1.569 m Patient Age:    82 years            BP:           172/101 mmHg Patient Gender: F                   HR:  84 bpm. Exam Location:  Inpatient Procedure: 2D Echo, Cardiac Doppler and Color Doppler (Both Spectral and Color            Flow Doppler were utilized during procedure). Indications:    Generalized weakness  History:        Patient has prior history of Echocardiogram examinations, most                 recent 02/17/2015. Abnormal ECG, Stroke, Arrythmias:Atrial                 Fibrillation and Atrial Flutter; Risk Factors:Dyslipidemia,                 Hypertension and Diabetes.  Sonographer:    Juanita Shaw Referring Phys: [id number] VASUNDHRA RATHORE IMPRESSIONS  1. Systolic function varies with each beat, but overall appears mildly reduced. Left ventricular ejection fraction, by estimation, is 45 to 50%. The left ventricle has mildly decreased function. The left ventricle demonstrates global hypokinesis.  2. Right ventricular systolic function is low normal. The right ventricular size is normal. There is moderately elevated  pulmonary artery systolic pressure.  3. Left atrial size was severely dilated.  4. The mitral valve is normal in structure. Trivial mitral valve regurgitation. No evidence of mitral stenosis.  5. The aortic valve is tricuspid. Aortic valve regurgitation is not visualized. No aortic stenosis is present.  6. The inferior vena cava is dilated in size with <50% respiratory variability, suggesting right atrial pressure of 15 mmHg. FINDINGS  Left Ventricle: Systolic function varies with each beat, but overall appears mildly reduced. Left ventricular ejection fraction, by estimation, is 45 to 50%. The left ventricle has mildly decreased function. The left ventricle demonstrates global hypokinesis. The left ventricular internal cavity size was normal in size. There is no left ventricular hypertrophy. Left ventricular diastolic function could not be evaluated due to atrial fibrillation. Indeterminate filling pressures. Right Ventricle: The right ventricular size is normal. No increase in right ventricular wall thickness. Right ventricular systolic function is low normal. There is moderately elevated pulmonary artery systolic pressure. The tricuspid regurgitant velocity  is 3.09 m/s, and with an assumed right atrial pressure of 15 mmHg, the estimated right ventricular systolic pressure is 53.2 mmHg. Left Atrium: Left atrial size was severely dilated. Right Atrium: Right atrial size was normal in size. Pericardium: There is no evidence of pericardial effusion. Mitral Valve: The mitral valve is normal in structure. Trivial mitral valve regurgitation. No evidence of mitral valve stenosis. MV peak gradient, 3.1 mmHg. The mean mitral valve gradient is 1.0 mmHg. Tricuspid Valve: The tricuspid valve is normal in structure. Tricuspid valve regurgitation is trivial. No evidence of tricuspid stenosis. Aortic Valve: The aortic valve is tricuspid. Aortic valve regurgitation is not visualized. No aortic stenosis is present. Aortic valve  mean gradient measures 3.0 mmHg. Aortic valve peak gradient measures 4.9 mmHg. Aortic valve area, by VTI measures 1.69 cm. Pulmonic Valve: The pulmonic valve was normal in structure. Pulmonic valve regurgitation is trivial. No evidence of pulmonic stenosis. Aorta: The aortic root is normal in size and structure. Venous: The inferior vena cava is dilated in size with less than 50% respiratory variability, suggesting right atrial pressure of 15 mmHg. IAS/Shunts: No atrial level shunt detected by color flow Doppler.  LEFT VENTRICLE PLAX 2D LVIDd:         3.90 cm     Diastology LVIDs:         2.40 cm     LV  e' medial:    6.53 cm/s LV PW:         1.00 cm     LV E/e' medial:  11.8 LV IVS:        0.80 cm     LV e' lateral:   5.87 cm/s LVOT diam:     1.80 cm     LV E/e' lateral: 13.1 LV SV:         28 LV SV Index:   18 LVOT Area:     2.54 cm  LV Volumes (MOD) LV vol d, MOD A2C: 65.3 ml LV vol d, MOD A4C: 78.2 ml LV vol s, MOD A2C: 28.8 ml LV vol s, MOD A4C: 32.1 ml LV SV MOD A2C:     36.5 ml LV SV MOD A4C:     78.2 ml LV SV MOD BP:      40.5 ml RIGHT VENTRICLE            IVC RV Basal diam:  3.50 cm    IVC diam: 2.00 cm RV Mid diam:    2.80 cm RV S prime:     7.07 cm/s TAPSE (M-mode): 1.4 cm LEFT ATRIUM             Index        RIGHT ATRIUM           Index LA diam:        3.90 cm 2.49 cm/m   RA Area:     13.10 cm LA Vol (A2C):   71.4 ml 45.51 ml/m  RA Volume:   27.90 ml  17.78 ml/m LA Vol (A4C):   54.3 ml 34.61 ml/m LA Biplane Vol: 62.1 ml 39.58 ml/m  AORTIC VALVE                    PULMONIC VALVE AV Area (Vmax):    1.37 cm     PV Vmax:          0.72 m/s AV Area (Vmean):   1.18 cm     PV Peak grad:     2.1 mmHg AV Area (VTI):     1.69 cm     PR End Diast Vel: 1.59 msec AV Vmax:           110.67 cm/s AV Vmean:          82.300 cm/s AV VTI:            0.164 m AV Peak Grad:      4.9 mmHg AV Mean Grad:      3.0 mmHg LVOT Vmax:         59.40 cm/s LVOT Vmean:        38.300 cm/s LVOT VTI:          0.109 m LVOT/AV VTI  ratio: 0.67  AORTA Ao Root diam: 3.60 cm Ao Asc diam:  3.63 cm MITRAL VALVE               TRICUSPID VALVE MV Area (PHT): 4.36 cm    TR Peak grad:   38.2 mmHg MV Area VTI:   1.68 cm    TR Vmax:        309.00 cm/s MV Peak grad:  3.1 mmHg MV Mean grad:  1.0 mmHg    SHUNTS MV Vmax:       0.88 m/s    Systemic VTI:  0.11 m MV Vmean:      54.6 cm/s   Systemic Diam:  1.80 cm MV Decel Time: 174 msec MV E velocity: 77.10 cm/s Maudine Sos MD Electronically signed by Maudine Sos MD Signature Date/Time: 10/18/2023/7:12:02 PM    Final    CT ABDOMEN PELVIS W CONTRAST Result Date: 10/17/2023 CLINICAL DATA:  Abdominal pain, acute, nonlocalized 83 yo F with a cc of fatigue. Going on for the past week or so. Diarrhea. Fatigue has had some nausea and dizziness as well. Symptoms are worse when she tries to get up and move around. EXAM: CT ABDOMEN AND PELVIS WITH CONTRAST TECHNIQUE: Multidetector CT imaging of the abdomen and pelvis was performed using the standard protocol following bolus administration of intravenous contrast. RADIATION DOSE REDUCTION: This exam was performed according to the departmental dose-optimization program which includes automated exposure control, adjustment of the mA and/or kV according to patient size and/or use of iterative reconstruction technique. CONTRAST:  OMNIPAQUE  IOHEXOL  300 MG/ML  SOLN COMPARISON:  MRI lumbar spine 05/24/2019 FINDINGS: Lower chest: Small hiatal hernia. Coronary artery calcifications. No acute abnormality. Hepatobiliary: No focal liver abnormality. No gallstones, gallbladder wall thickening, or pericholecystic fluid. No biliary dilatation. Pancreas: Diffusely atrophic. No focal lesion. Otherwise normal pancreatic contour. No surrounding inflammatory changes. No main pancreatic ductal dilatation. Spleen: Normal in size without focal abnormality. Adrenals/Urinary Tract: No adrenal nodule bilaterally. Delayed right nephrogram. Atrophic right kidney with renal cortical  scarring. Right urothelial thickening. Question right renal interpolar mass measuring 2.5 cm. No hydronephrosis. No hydroureter.  No nephroureterolithiasis. Urinary bladder wall thickening. On delayed imaging, there is no urothelial wall thickening and there are no filling defects in the opacified portions of the left collecting system or ureter. Stomach/Bowel: Stomach is within normal limits. No evidence of bowel wall thickening or dilatation. Colonic diverticulosis. The appendix is not definitely identified with no inflammatory changes in the right lower quadrant to suggest acute appendicitis. Vascular/Lymphatic: No abdominal aorta or iliac aneurysm. Severe atherosclerotic plaque of the aorta and its branches. 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 No abdominal, pelvic, or inguinal lymphadenopathy. Reproductive: Pessary device noted. Uterus and bilateral adnexa are unremarkable. Other: No intraperitoneal free fluid. No intraperitoneal free gas. No organized fluid collection. Musculoskeletal: No abdominal wall hernia or abnormality. No suspicious lytic or blastic osseous lesions. No acute displaced fracture. Multilevel degenerative changes of the spine. Age-indeterminate superior endplate L2 compression fracture with at least 40% vertebral body height loss. Mild to moderate right hip degenerative changes. IMPRESSION: 1. Cystitis with right urothelial thickening suggestive of ascending infection-correlate for with urinalysis for infection. 2. Question 2.5 cm right renal interpolar mass. When the patient is clinically stable and able to follow directions and hold their breath (preferably as an outpatient) further evaluation with dedicated MRI renal protocol should be considered. 3. Small hiatal hernia. 4. Colonic diverticulosis with no acute diverticulitis. 5. Aortic Atherosclerosis (ICD10-I70.0) with severe stenosis of the origin/proximal superior mesenteric  artery. Electronically Signed   By: Morgane  Naveau M.D.   On: 10/17/2023 23:44   DG Lumbar Spine Complete Result Date: 10/17/2023 CLINICAL DATA:  Multiple falls, pain EXAM: LUMBAR SPINE - COMPLETE 4+ VIEW COMPARISON:  09/03/2019 FINDINGS: Frontal, bilateral oblique, lateral views of the lumbar spine are obtained. There are 5 non-rib-bearing lumbar type vertebral bodies in normal anatomic alignment. There is an age indeterminate compression deformity involving the superior endplate of the L2 vertebral body, with approximately 10% loss of vertebral body height. This is new since 2021, with no interval studies available for comparison. No other acute bony abnormalities. There  is diffuse lumbar facet hypertrophy. Mild diffuse spondylosis, greatest at the thoracolumbar junction. Sacroiliac joints appear unremarkable. IMPRESSION: 1. Age indeterminate compression deformity within the superior endplate of the L2 vertebral body, with approximately 10% loss of vertebral body height. This is new since the 2021 comparison study, with no interval exams available for comparison. Please correlate with site of patient's pain, as this could reflect sequela of recent trauma. 2. Diffuse lumbar spondylosis and facet hypertrophy as above. Electronically Signed   By: Bobbye Burrow M.D.   On: 10/17/2023 17:03   DG Knee Complete 4 Views Right Result Date: 10/17/2023 CLINICAL DATA:  Multiple falls, knee pain EXAM: RIGHT KNEE - COMPLETE 4+ VIEW COMPARISON:  None Available. FINDINGS: Frontal, bilateral oblique, and cross-table lateral views of the right knee are obtained on 4 images. Three component right knee arthroplasty is identified in the expected position without evidence of acute complication. No acute displaced fractures. Small suprapatellar joint effusion. Soft tissues are unremarkable. IMPRESSION: 1. Unremarkable right knee arthroplasty. 2. Small joint effusion. 3. No acute fracture. Electronically Signed   By: Bobbye Burrow M.D.   On: 10/17/2023 17:00   CT Head Wo Contrast Result Date: 10/17/2023 CLINICAL DATA:  Vertigo, central. EXAM: CT HEAD WITHOUT CONTRAST TECHNIQUE: Contiguous axial images were obtained from the base of the skull through the vertex without intravenous contrast. RADIATION DOSE REDUCTION: This exam was performed according to the departmental dose-optimization program which includes automated exposure control, adjustment of the mA and/or kV according to patient size and/or use of iterative reconstruction technique. COMPARISON:  Head CT 05/12/2023 FINDINGS: Brain: There is no evidence of an acute infarct, intracranial hemorrhage, mass, midline shift, or extra-axial fluid collection. There is mild cerebral atrophy. Patchy hypodensities in the cerebral white matter bilaterally are unchanged and nonspecific but compatible with mild-to-moderate chronic small vessel ischemic disease. Vascular: Calcified atherosclerosis at the skull base. No hyperdense vessel. Skull: No fracture or suspicious lesion. Sinuses/Orbits: Visualized paranasal sinuses and mastoid air cells are clear. Bilateral cataract extraction. Other: None. IMPRESSION: 1. No evidence of acute intracranial abnormality. 2. Mild-to-moderate chronic small vessel ischemic disease. Electronically Signed   By: Aundra Lee M.D.   On: 10/17/2023 16:22     Labs:   Basic Metabolic Panel: Recent Labs  Lab 10/17/23 1658 10/18/23 0304 10/19/23 0508 10/20/23 1300 10/21/23 0740 10/21/23 1127 10/22/23 0529  NA 133* 133* 133* 133* 141 138 139  K 3.4* 3.8 3.7 3.3* 3.0* 3.7 3.9  CL 97* 103 100 100 117* 107 108  CO2 25 23 21* 25 17* 21* 25  GLUCOSE 141* 129* 107* 120* 73 121* 99  BUN 20 16 16 23 14 19 16   CREATININE 0.95 0.85 0.92 0.84 0.60 0.76 0.74  CALCIUM  8.8* 8.1* 8.4* 8.5* 6.2* 8.5* 8.5*  MG 1.8  --   --   --  1.5*  --   --   PHOS  --  2.6  --   --   --   --   --    GFR Estimated Creatinine Clearance: 42.4 mL/min (by C-G formula based on  SCr of 0.74 mg/dL). Liver Function Tests: Recent Labs  Lab 10/17/23 1658 10/18/23 0304 10/19/23 0508 10/21/23 0740 10/22/23 0529  AST 32 42* 59* 45* 45*  ALT 31 39 56* 55* 67*  ALKPHOS 77 69 66 49 70  BILITOT 2.8* 2.5* 2.1* 0.9 1.0  PROT 6.9 6.3* 6.5 4.3* 6.0*  ALBUMIN 3.3* 3.0* 3.0* 1.9* 2.6*   Recent Labs  Lab  10/17/23 1658  LIPASE 27   No results for input(s): "AMMONIA" in the last 168 hours. Coagulation profile No results for input(s): "INR", "PROTIME" in the last 168 hours.  CBC: Recent Labs  Lab 10/17/23 1658 10/18/23 0304 10/19/23 0508 10/20/23 1300 10/21/23 1127  WBC 8.3 6.6 8.5 7.9 7.5  NEUTROABS 6.6  --  5.2  --   --   HGB 11.3* 10.6* 11.4* 11.5* 11.3*  HCT 34.9* 31.8* 34.7* 35.4* 36.3  MCV 99.7 98.1 96.9 97.8 103.7*  PLT 220 189 188 212 219   Cardiac Enzymes: No results for input(s): "CKTOTAL", "CKMB", "CKMBINDEX", "TROPONINI" in the last 168 hours. BNP: Invalid input(s): "POCBNP" CBG: Recent Labs  Lab 10/21/23 0821 10/21/23 1227 10/21/23 1703 10/21/23 2106 10/22/23 0744  GLUCAP 112* 115* 120* 125* 101*   D-Dimer No results for input(s): "DDIMER" in the last 72 hours. Hgb A1c No results for input(s): "HGBA1C" in the last 72 hours. Lipid Profile No results for input(s): "CHOL", "HDL", "LDLCALC", "TRIG", "CHOLHDL", "LDLDIRECT" in the last 72 hours. Thyroid function studies No results for input(s): "TSH", "T4TOTAL", "T3FREE", "THYROIDAB" in the last 72 hours.  Invalid input(s): "FREET3" Anemia work up No results for input(s): "VITAMINB12", "FOLATE", "FERRITIN", "TIBC", "IRON", "RETICCTPCT" in the last 72 hours. Microbiology Recent Results (from the past 240 hours)  Urine Culture     Status: Abnormal   Collection Time: 10/18/23  6:11 PM   Specimen: Urine, Random  Result Value Ref Range Status   Specimen Description   Final    URINE, RANDOM Performed at [facility], [address], [place], [place] [address]     Special Requests   Final    NONE Reflexed from [id number] Performed at [facility], [address], [place], [place] [address]    Culture >=100,000 COLONIES/mL ESCHERICHIA COLI (A)  Final   Report Status 10/21/2023 FINAL  Final   Organism ID, Bacteria ESCHERICHIA COLI (A)  Final      Susceptibility   Escherichia coli - MIC*    AMPICILLIN 4 SENSITIVE Sensitive     CEFAZOLIN  <=4 SENSITIVE Sensitive     CEFEPIME <=0.12 SENSITIVE Sensitive     CEFTRIAXONE  <=0.25 SENSITIVE Sensitive     CIPROFLOXACIN <=0.25 SENSITIVE Sensitive     GENTAMICIN <=1 SENSITIVE Sensitive     IMIPENEM <=0.25 SENSITIVE Sensitive     NITROFURANTOIN <=16 SENSITIVE Sensitive     TRIMETH/SULFA <=20 SENSITIVE Sensitive     AMPICILLIN/SULBACTAM <=2 SENSITIVE Sensitive     PIP/TAZO <=4 SENSITIVE Sensitive ug/mL    * >=100,000 COLONIES/mL ESCHERICHIA COLI  Gastrointestinal Panel by PCR , Stool     Status: None   Collection Time: 10/20/23  6:16 PM   Specimen: Stool  Result Value Ref Range Status   Campylobacter species NOT DETECTED NOT DETECTED Final   Plesimonas shigelloides NOT DETECTED NOT DETECTED Final   Salmonella species NOT DETECTED NOT DETECTED Final   Yersinia enterocolitica NOT DETECTED NOT DETECTED Final   Vibrio species NOT DETECTED NOT DETECTED Final   Vibrio cholerae NOT DETECTED NOT DETECTED Final   Enteroaggregative E coli (EAEC) NOT DETECTED NOT DETECTED Final   Enteropathogenic E coli (EPEC) NOT DETECTED NOT DETECTED Final   Enterotoxigenic E coli (ETEC) NOT DETECTED NOT DETECTED Final   Shiga like toxin producing E coli (STEC) NOT DETECTED NOT DETECTED Final   Shigella/Enteroinvasive E coli (EIEC) NOT DETECTED NOT DETECTED Final   Cryptosporidium NOT DETECTED NOT DETECTED Final   Cyclospora cayetanensis NOT  DETECTED NOT DETECTED Final   Entamoeba histolytica NOT DETECTED NOT DETECTED Final   Giardia lamblia NOT DETECTED NOT DETECTED Final   Adenovirus F40/41 NOT DETECTED NOT  DETECTED Final   Astrovirus NOT DETECTED NOT DETECTED Final   Norovirus GI/GII NOT DETECTED NOT DETECTED Final   Rotavirus A NOT DETECTED NOT DETECTED Final   Sapovirus (I, II, IV, and V) NOT DETECTED NOT DETECTED Final    Comment: Performed at [facility], [address], [place], [place] [address]     Discharge Instructions:   Discharge Instructions     Diet - low sodium heart healthy   Complete by: As directed    Diet Carb Modified   Complete by: As directed    Discharge instructions   Complete by: As directed    Change pad when soiled  Home health Increase water intake   Increase activity slowly   Complete by: As directed       Allergies as of 10/22/2023   No Known Allergies      Medication List     PAUSE taking these medications    metFORMIN  500 MG tablet Wait to take this until your doctor or other care provider tells you to start again. Commonly known as: GLUCOPHAGE  Take 500 mg by mouth daily.       STOP taking these medications    guaiFENesin  100 MG/5ML liquid Commonly known as: ROBITUSSIN   polyethylene glycol powder 17 GM/SCOOP powder Commonly known as: GLYCOLAX /MIRALAX        TAKE these medications    acidophilus Caps capsule Take 2 capsules by mouth daily. Start taking on: Oct 23, 2023   allopurinol  100 MG tablet Commonly known as: ZYLOPRIM  Take 100 mg by mouth daily.   apixaban  5 MG Tabs tablet Commonly known as: ELIQUIS  Take 1 tablet (5 mg total) by mouth 2 (two) times daily.   cefadroxil 500 MG capsule Commonly known as: DURICEF Take 2 capsules (1,000 mg total) by mouth 2 (two) times daily.   ezetimibe  10 MG tablet Commonly known as: ZETIA  Take 10 mg by mouth at bedtime.   hydrochlorothiazide  12.5 MG tablet Commonly known as: HYDRODIURIL  Take 12.5 mg by mouth daily.   losartan  100 MG tablet Commonly known as: COZAAR  Take 1 tablet by mouth once daily   metoprolol  succinate 25 MG 24 hr tablet Commonly known as:  TOPROL -XL Take 25 mg by mouth daily.   nortriptyline  25 MG capsule Commonly known as: PAMELOR  Take 25 mg by mouth at bedtime.   ProAir  RespiClick 108 (90 Base) MCG/ACT Aepb Generic drug: Albuterol  Sulfate Inhale 1 puff into the lungs every 6 (six) hours as needed.   traMADol  50 MG tablet Commonly known as: ULTRAM  Take 50 mg by mouth every 6 (six) hours as needed for moderate pain (pain score 4-6).   VITAMIN D  PO Take 1,000 Units by mouth daily.        Follow-up Information     [facility] Follow up.   Why: [facility] will provide PT and OT in the home after discharge. Contact information: [address] [place] [address] [phone]                  Time coordinating discharge: 45 min  Signed:  Enrigue Harvard DO  [facility] 10/22/2023, 10:59 AM

## 2023-10-22 NOTE — TOC Transition Note (Signed)
 Transition of Care [facility]) - Discharge Note   Patient Details  Name: [name] MRN: [id number] Date of Birth: 06-May-1941  [facility] CM/SW Contact:  Levie Ream, RN Phone Number: 10/22/2023, 1:50 PM   Clinical Narrative:    D/C orders received; HHPT/OT previously arranged w/ [facility]; Bonnie at agency notified; no TOC needs.   Final next level of care: Home w Home Health Services Barriers to Discharge: No Barriers Identified   Patient Goals and CMS Choice Patient states their goals for this hospitalization and ongoing recovery are:: Resume HH with [facility] CMS Medicare.gov Compare Post Acute Care list provided to:: Patient Represenative (must comment) Choice offered to / list presented to : Spouse      Discharge Placement                       Discharge Plan and Services Additional resources added to the After Visit Summary for   In-house Referral: Clinical Social Work   Post Acute Care Choice: Home Health          DME Arranged: N/A DME Agency: NA         HH Agency: Other - See comment Lennart Quitter) Date HH Agency Contacted: 10/19/23   Representative spoke with at [facility] Agency: Melissa  Social Drivers of Health (SDOH) Interventions SDOH Screenings   Food Insecurity: No Food Insecurity (10/18/2023)  Housing: Low Risk  (10/18/2023)  Transportation Needs: No Transportation Needs (10/18/2023)  Utilities: Not At Risk (10/18/2023)  Depression (PHQ2-9): Low Risk  (10/17/2022)  Social Connections: Moderately Integrated (10/18/2023)  Tobacco Use: Low Risk  (10/17/2023)     Readmission Risk Interventions    05/20/2022    3:36 PM  Readmission Risk Prevention Plan  Post Dischage Appt Complete  Medication Screening Complete  Transportation Screening Complete

## 2023-10-22 NOTE — Progress Notes (Signed)
 Mobility Specialist - Progress Note   10/22/23 0853  Mobility  Activity Transferred to/from [facility];Ambulated with assistance in hallway  Level of Assistance Contact guard assist, steadying assist  Assistive Device Front wheel walker  Distance Ambulated (ft) 70 ft  Range of Motion/Exercises Active  Activity Response Tolerated well  Mobility Referral Yes  Mobility visit 1 Mobility  Mobility Specialist Start Time (ACUTE ONLY) [id number]  Mobility Specialist Stop Time (ACUTE ONLY) 0853  Mobility Specialist Time Calculation (min) (ACUTE ONLY) 20 min   Pt was found in bed and agreeable to ambulate. Grew fatigued with session and returned to recliner chair with all needs met. Call bell in reach and daughter in room.  Lorna Rose Mobility Specialist

## 2023-10-31 ENCOUNTER — Ambulatory Visit: Admitting: Obstetrics and Gynecology

## 2023-11-02 ENCOUNTER — Encounter: Payer: Self-pay | Admitting: Obstetrics and Gynecology

## 2023-11-02 ENCOUNTER — Ambulatory Visit: Admitting: Obstetrics and Gynecology

## 2023-11-02 ENCOUNTER — Other Ambulatory Visit (HOSPITAL_COMMUNITY)
Admission: RE | Admit: 2023-11-02 | Discharge: 2023-11-02 | Disposition: A | Discharge status: Home / Self Care | Source: Other Acute Inpatient Hospital | Attending: Obstetrics and Gynecology | Admitting: Obstetrics and Gynecology

## 2023-11-02 VITALS — BP 150/90 | HR 78

## 2023-11-02 DIAGNOSIS — N952 Postmenopausal atrophic vaginitis: Secondary | ICD-10-CM

## 2023-11-02 DIAGNOSIS — R82998 Other abnormal findings in urine: Secondary | ICD-10-CM

## 2023-11-02 DIAGNOSIS — N813 Complete uterovaginal prolapse: Secondary | ICD-10-CM

## 2023-11-02 DIAGNOSIS — N390 Urinary tract infection, site not specified: Secondary | ICD-10-CM | POA: Insufficient documentation

## 2023-11-02 DIAGNOSIS — R350 Frequency of micturition: Secondary | ICD-10-CM

## 2023-11-02 LAB — POCT URINALYSIS DIPSTICK
Bilirubin, UA: NEGATIVE
Glucose, UA: NEGATIVE
Ketones, UA: NEGATIVE
Nitrite, UA: NEGATIVE
Protein, UA: NEGATIVE
Spec Grav, UA: 1.01 (ref 1.010–1.025)
Urobilinogen, UA: 1 U/dL
pH, UA: 6.5 (ref 5.0–8.0)

## 2023-11-02 MED ORDER — ESTRADIOL 0.1 MG/GM VA CREA: 0.5000 g | TOPICAL_CREAM | VAGINAL | 11 refills | Status: AC

## 2023-11-02 MED ORDER — SULFAMETHOXAZOLE-TRIMETHOPRIM 800-160 MG PO TABS: 1.0000 | ORAL_TABLET | Freq: Two times a day (BID) | ORAL | 0 refills | Status: AC

## 2023-11-02 NOTE — Patient Instructions (Addendum)
 Please use the estrogen cream nightly for the next two weeks. 0.5-1g per night. This is important for vaginal healing. We will see about Re-fitting you at your next appointment. We need to keep the appointments at every 3 months for cleaning to prevent the irritation we had today.   We are going to send your urine for culture and we will send you an antibiotic in to start for the probably UTI. Take the bactrim x2 daily for the next 3 days.

## 2023-11-02 NOTE — Progress Notes (Signed)
 [facility]   Subjective:     Chief Complaint:  Chief Complaint  Patient presents with   Pessary Check    [name] is a 83 y.o. female is here for pessary cleaning    History of Present Illness: [name] is a 83 y.o. female with stage IV pelvic organ prolapse who presents for a pessary check. She is using a size #7 long stem gellhorn pessary. The pessary has been was last put in in October of 2024. She has had multiple falls since the October.. She is not using vaginal estrogen. She denies vaginal bleeding.   She also reports she has been having trouble with her bowel movements. She has been using a Training and development officer.    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   Past Surgical History: She  has a past surgical history that includes Cholecystectomy; Appendectomy; Rotator cuff repair; Joint replacement; Joint replacement; Cataract extraction (2015); Eye surgery; and Laminectomy with posterior lateral arthrodesis level 2 (N/A, 06/07/2019).   Medications: She has a current medication list which includes the following prescription(s): [START ON 11/06/2023] estradiol , sulfamethoxazole-trimethoprim, acidophilus, proair  respiclick, allopurinol , apixaban , cefadroxil , vitamin d , ezetimibe , hydrochlorothiazide ,  losartan , [Paused] metformin , metoprolol  succinate, nortriptyline , and tramadol .   Allergies: Patient has no known allergies.   Social History: Patient  reports that she has never smoked. She has never used smokeless tobacco. She reports current alcohol use of about 1.0 standard drink of alcohol per week. She reports that she does not use drugs.      Objective:   10/17/23 CT ABDOMEN AND PELVIS WITH CONTRAST   TECHNIQUE: Multidetector CT imaging of the abdomen and pelvis was performed using the standard protocol following bolus administration of intravenous contrast.   RADIATION DOSE REDUCTION: This exam was performed according to the departmental dose-optimization program which includes automated exposure control, adjustment of the mA and/or kV according to patient size and/or use of iterative reconstruction technique.   CONTRAST:  OMNIPAQUE  IOHEXOL  300 MG/ML  SOLN   COMPARISON:  MRI lumbar spine 05/24/2019   FINDINGS: Lower chest: Small hiatal hernia. Coronary artery calcifications. No acute abnormality.   Hepatobiliary: No focal liver abnormality. No gallstones, gallbladder wall thickening, or pericholecystic fluid. No biliary dilatation.   Pancreas: Diffusely atrophic. No focal lesion. Otherwise normal pancreatic contour. No surrounding inflammatory changes. No main pancreatic ductal dilatation.   Spleen: Normal in size without focal abnormality.   Adrenals/Urinary Tract:   No adrenal nodule bilaterally.   Delayed right nephrogram. Atrophic right kidney with renal cortical scarring. Right urothelial thickening. Question right renal interpolar mass measuring 2.5 cm.   No hydronephrosis. No hydroureter.  No nephroureterolithiasis.   Urinary bladder wall thickening.   On delayed imaging, there is no urothelial wall thickening and there are no filling defects in the opacified portions of the left collecting system or ureter.   Stomach/Bowel: Stomach is  within normal limits. No evidence of bowel wall thickening or dilatation. Colonic diverticulosis. The appendix is not definitely identified with no inflammatory changes in the right lower quadrant  to suggest acute appendicitis.   Vascular/Lymphatic: No abdominal aorta or iliac aneurysm. Severe atherosclerotic plaque of the aorta and its branches. 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 No abdominal, pelvic, or inguinal lymphadenopathy.   Reproductive: Pessary device noted. Uterus and bilateral adnexa are unremarkable.   Other: No intraperitoneal free fluid. No intraperitoneal free gas. No organized fluid collection.   Musculoskeletal:   No abdominal wall hernia or abnormality.   No suspicious lytic or blastic osseous lesions. No acute displaced fracture. Multilevel degenerative changes of the spine. Age-indeterminate superior endplate L2 compression fracture with at least 40% vertebral body height loss. Mild to moderate right hip degenerative changes.   IMPRESSION: 1. Cystitis with right urothelial thickening suggestive of ascending infection-correlate for with urinalysis for infection. 2. Question 2.5 cm right renal interpolar mass. When the patient is clinically stable and able to follow directions and hold their breath (preferably as an outpatient) further evaluation with dedicated MRI renal protocol should be considered. 3. Small hiatal hernia. 4. Colonic diverticulosis with no acute diverticulitis. 5. 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  Physical Exam: BP (!) 150/90 (BP Location: Left Arm, Patient Position: Sitting, Cuff Size: Normal) Comment (Cuff Size): mauel blood pressure  Pulse 78  Gen: No apparent distress, A&O x 3. Detailed Urogynecologic Evaluation:  Pelvic Exam: Normal external female genitalia; Bartholin's and Skene's glands normal  in appearance; urethral meatus normal in appearance, no urethral masses or discharge. The pessary was noted to be in place. It was removed with a tenaculum due to the difficulty of removal and the decreased size of her Gh.  Speculum exam revealed mild excoriation in the vagina.   Rectal exam did not reveal fistula. No fecal matter noted in vagina. No sign of urine in vagina. Patient did have a loss of stool and urine with pessary removal.   Pessary was not replaced. Will see about a pessary refitting once she has had time to use estrogen cream.   Laboratory Results: Lab Results  Component Value Date   COLORU Yellow 11/02/2023   CLARITYU Cloudy 11/02/2023   GLUCOSEUR Negative 11/02/2023   BILIRUBINUR negative 11/02/2023   KETONESU negative 11/02/2023   SPECGRAV 1.010 11/02/2023   RBCUR moderate 11/02/2023   PHUR 6.5 11/02/2023   PROTEINUR Negative 11/02/2023   UROBILINOGEN 1.0 11/02/2023   LEUKOCYTESUR Large (3+) (A) 11/02/2023     Assessment/Plan:    Assessment: Ms. Giltner is a 83 y.o. with stage IV pelvic organ prolapse here for a pessary check. She is doing well.  Plan: She will use estrogen cream nighty for the next two weeks and then we will plan for her to return for a pessary fitting.   E-string is not covered under her insurance so will need to continue with cream.   Will plan for re-fitting.  Will treat for UTI and send for culture due to blood and white blood cells in urine. Bactrim sent to pharmacy for patient.   Patient to follow up in 3 weeks.

## 2023-11-03 LAB — URINE CULTURE

## 2023-11-06 ENCOUNTER — Ambulatory Visit: Payer: Self-pay | Admitting: Obstetrics and Gynecology

## 2023-11-10 ENCOUNTER — Other Ambulatory Visit: Payer: Self-pay | Admitting: Urology

## 2023-11-10 DIAGNOSIS — N281 Cyst of kidney, acquired: Secondary | ICD-10-CM

## 2023-11-23 ENCOUNTER — Encounter: Payer: Self-pay | Admitting: Obstetrics and Gynecology

## 2023-11-23 ENCOUNTER — Ambulatory Visit (INDEPENDENT_AMBULATORY_CARE_PROVIDER_SITE_OTHER): Admitting: Obstetrics and Gynecology

## 2023-11-23 VITALS — BP 130/74 | HR 76

## 2023-11-23 DIAGNOSIS — N813 Complete uterovaginal prolapse: Secondary | ICD-10-CM | POA: Diagnosis not present

## 2023-11-23 DIAGNOSIS — N816 Rectocele: Secondary | ICD-10-CM

## 2023-11-23 DIAGNOSIS — N8110 Cystocele, unspecified: Secondary | ICD-10-CM

## 2023-11-23 NOTE — Progress Notes (Signed)
 [facility]   Subjective:     Chief Complaint: Follow-up Prestage [name] is a 83 y.o. female is here for pessary re-fitting.)  History of Present Illness: [name] is a 83 y.o. female with stage IV pelvic organ prolapse who presents today for a pessary fitting.    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), Cerebral infarction due to embolism of right middle cerebral artery (HCC), Diabetes mellitus, Diarrhea (11/30/2009), Excessive sleepiness (05/16/2022), Fatigue (05/16/2022), Fatty liver, Fibromyalgia, History of uterine prolapse (04/04/2022), Hyperlipidemia, Hypertension, Lymphoma malignant, large cell (HCC), Neuropathy, Night sweat (05/16/2022), Non Hodgkin's lymphoma (HCC), PAF (paroxysmal atrial fibrillation) (HCC), Slurred speech (02/16/2015), Stroke [facility]), Syncope (03/06/2018), Urge incontinence (04/04/2022), and Urinary tract infection (09/19/2011).   Past Surgical History: She  has a past surgical history that includes Cholecystectomy; Appendectomy; Rotator cuff repair; Joint replacement; Joint replacement; Cataract extraction (2015); Eye surgery; and Laminectomy with posterior lateral arthrodesis level 2 (N/A, 06/07/2019).   Medications: She has a current medication list which includes the following prescription(s): acidophilus, proair  respiclick, allopurinol , apixaban , cefadroxil , vitamin d , estradiol , ezetimibe , hydrochlorothiazide , losartan , metoprolol  succinate, nortriptyline , tramadol , and [Paused] metformin .   Allergies: Patient has no known allergies.   Social History: Patient  reports that she has never smoked. She has never used smokeless tobacco. She reports current alcohol use of about 1.0 standard drink of alcohol per week. She reports that she does not use drugs.      Objective:     BP 130/74   Pulse 76  Gen: No apparent  distress, A&O x 3. Pelvic Exam: Normal external female genitalia; Bartholin's and Skene's glands normal in appearance; urethral meatus normal in appearance, no urethral masses or discharge.   A size #5 short stem gellhorn pessary was fitted. It was comfortable, stayed in place with valsalva and was an appropriate size on examination, with one finger fitting between the pessary and the vaginal walls.   Assessment/Plan:    Assessment: [name] is a 83 y.o. with stage IV pelvic organ prolapse who presents for a pessary fitting. Plan: She was fitted with a #5 short stem gellhorn pessary. She will keep the pessary in place until next visit. She will use estrogen.   Follow-up in 2 months for a pessary check or sooner as needed.  All questions were answered.    Cael Worth G Alyza Artiaga, NP

## 2023-11-23 NOTE — Patient Instructions (Signed)
 Use the estrogen cream x2 weekly  Call if any issues with bowel or bladder  Please use miralax  for your bowels

## 2023-12-17 ENCOUNTER — Ambulatory Visit
Admission: RE | Admit: 2023-12-17 | Discharge: 2023-12-17 | Disposition: A | Discharge status: Home / Self Care | Source: Ambulatory Visit | Attending: Urology | Admitting: Urology

## 2023-12-17 DIAGNOSIS — N281 Cyst of kidney, acquired: Secondary | ICD-10-CM

## 2023-12-17 MED ORDER — GADOPICLENOL 0.5 MMOL/ML IV SOLN
7.5000 mL | Freq: Once | INTRAVENOUS | Status: AC | PRN
  Administered 2023-12-17: 7.5 mL via INTRAVENOUS

## 2024-01-04 ENCOUNTER — Other Ambulatory Visit: Payer: Self-pay | Admitting: Cardiology

## 2024-01-23 ENCOUNTER — Ambulatory Visit (INDEPENDENT_AMBULATORY_CARE_PROVIDER_SITE_OTHER): Admitting: Obstetrics and Gynecology

## 2024-01-23 ENCOUNTER — Encounter: Payer: Self-pay | Admitting: Obstetrics and Gynecology

## 2024-01-23 VITALS — BP 158/93 | HR 78

## 2024-01-23 DIAGNOSIS — N952 Postmenopausal atrophic vaginitis: Secondary | ICD-10-CM | POA: Diagnosis not present

## 2024-01-23 DIAGNOSIS — N8110 Cystocele, unspecified: Secondary | ICD-10-CM

## 2024-01-23 DIAGNOSIS — N816 Rectocele: Secondary | ICD-10-CM

## 2024-01-23 DIAGNOSIS — N813 Complete uterovaginal prolapse: Secondary | ICD-10-CM

## 2024-01-23 NOTE — Progress Notes (Signed)
 [facility]   Subjective:     Chief Complaint:  Chief Complaint  Patient presents with   Follow-up    [name] is a 83 y.o. female here today for pessary check. Recent UTI (01-06-2024) treatment by pcp.   History of Present Illness: [name] is a 83 y.o. female with stage IV pelvic organ prolapse who presents for a pessary check. She is using a size #5 short stem gellhorn pessary. The pessary has been working well and she has no complaints. She is using vaginal estrogen. She denies vaginal bleeding. She has been having a lot of vaginal discharge and reports she recently had a UTI which was treated by PCP office.   Patient also reports she took her miralax  Sunday and has been having bowel leakage since then.   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   Past Surgical History: She  has a past surgical history that includes Cholecystectomy; Appendectomy; Rotator cuff repair; Joint replacement; Joint replacement; Cataract extraction (2015); Eye surgery; and Laminectomy with posterior lateral arthrodesis level 2 (N/A, 06/07/2019).   Medications: She has a current medication list which includes the following prescription(s): acidophilus, proair  respiclick, allopurinol , apixaban , cefadroxil , vitamin d ,  estradiol , ezetimibe , hydrochlorothiazide , losartan , metoprolol  succinate, nortriptyline , tramadol , and [Paused] metformin .   Allergies: Patient has no known allergies.   Social History: Patient  reports that she has never smoked. She has never used smokeless tobacco. She reports current alcohol use of about 1.0 standard drink of alcohol per week. She reports that she does not use drugs.      Objective:    Physical Exam: BP (!) 158/93   Pulse 78  Gen: No apparent distress, A&O x 3. Detailed Urogynecologic Evaluation:  Pelvic Exam: Normal external female genitalia; Bartholin's and Skene's glands normal in appearance; urethral meatus normal in appearance, no urethral masses or discharge. The pessary was noted to be in place. It was removed and cleaned. Speculum exam revealed erythema in the vagina. The pessary was replaced with estrogen cream on the back of the Gellhorn to support the apex tissues. It was comfortable to the patient and fit well.  Patient had noted vaginal discharge.     Assessment/Plan:    Assessment: [name] is a 83 y.o. with stage IV pelvic organ prolapse here for a pessary check. She is doing well.  Plan: She will keep the pessary in place until next visit. She will continue to use estrogen and boric acid suppositories for the vaginal secretions. She will follow-up in 3 months for a pessary check or sooner as needed.   Have requested urine culture results from recent treated UTI.   All questions were answered.

## 2024-03-27 ENCOUNTER — Other Ambulatory Visit: Payer: Self-pay | Admitting: Family Medicine

## 2024-03-27 DIAGNOSIS — R531 Weakness: Secondary | ICD-10-CM

## 2024-03-27 DIAGNOSIS — R413 Other amnesia: Secondary | ICD-10-CM

## 2024-04-05 ENCOUNTER — Other Ambulatory Visit: Payer: Self-pay | Admitting: Cardiology

## 2024-04-05 NOTE — Telephone Encounter (Signed)
 Sent refill request to [facility] dept, pt needs overdue appt for futre refills

## 2024-04-21 ENCOUNTER — Ambulatory Visit
Admission: RE | Admit: 2024-04-21 | Discharge: 2024-04-21 | Disposition: A | Source: Ambulatory Visit | Attending: Family Medicine | Admitting: Family Medicine

## 2024-04-21 DIAGNOSIS — R413 Other amnesia: Secondary | ICD-10-CM

## 2024-04-21 DIAGNOSIS — R531 Weakness: Secondary | ICD-10-CM

## 2024-04-21 MED ORDER — GADOPICLENOL 0.5 MMOL/ML IV SOLN
6.5000 mL | Freq: Once | INTRAVENOUS | Status: AC | PRN
Start: 2024-04-21 — End: 2024-04-21
  Administered 2024-04-21: 6.5 mL via INTRAVENOUS

## 2024-04-25 ENCOUNTER — Ambulatory Visit: Admitting: Obstetrics and Gynecology

## 2024-05-14 ENCOUNTER — Ambulatory Visit (INDEPENDENT_AMBULATORY_CARE_PROVIDER_SITE_OTHER): Admitting: Obstetrics and Gynecology

## 2024-05-14 ENCOUNTER — Encounter: Payer: Self-pay | Admitting: Obstetrics and Gynecology

## 2024-05-14 VITALS — BP 138/91 | HR 80

## 2024-05-14 DIAGNOSIS — N816 Rectocele: Secondary | ICD-10-CM

## 2024-05-14 DIAGNOSIS — F0150 Vascular dementia without behavioral disturbance: Secondary | ICD-10-CM | POA: Insufficient documentation

## 2024-05-14 DIAGNOSIS — R269 Unspecified abnormalities of gait and mobility: Secondary | ICD-10-CM | POA: Insufficient documentation

## 2024-05-14 DIAGNOSIS — G620 Drug-induced polyneuropathy: Secondary | ICD-10-CM | POA: Insufficient documentation

## 2024-05-14 DIAGNOSIS — N812 Incomplete uterovaginal prolapse: Secondary | ICD-10-CM

## 2024-05-14 DIAGNOSIS — E785 Hyperlipidemia, unspecified: Secondary | ICD-10-CM | POA: Insufficient documentation

## 2024-05-14 DIAGNOSIS — M797 Fibromyalgia: Secondary | ICD-10-CM | POA: Insufficient documentation

## 2024-05-14 DIAGNOSIS — I129 Hypertensive chronic kidney disease with stage 1 through stage 4 chronic kidney disease, or unspecified chronic kidney disease: Secondary | ICD-10-CM | POA: Insufficient documentation

## 2024-05-14 DIAGNOSIS — Z960 Presence of urogenital implants: Secondary | ICD-10-CM

## 2024-05-14 DIAGNOSIS — N813 Complete uterovaginal prolapse: Secondary | ICD-10-CM

## 2024-05-14 DIAGNOSIS — E039 Hypothyroidism, unspecified: Secondary | ICD-10-CM | POA: Insufficient documentation

## 2024-05-14 DIAGNOSIS — E1121 Type 2 diabetes mellitus with diabetic nephropathy: Secondary | ICD-10-CM | POA: Insufficient documentation

## 2024-05-14 DIAGNOSIS — K219 Gastro-esophageal reflux disease without esophagitis: Secondary | ICD-10-CM | POA: Insufficient documentation

## 2024-05-14 DIAGNOSIS — M109 Gout, unspecified: Secondary | ICD-10-CM | POA: Insufficient documentation

## 2024-05-14 DIAGNOSIS — R413 Other amnesia: Secondary | ICD-10-CM | POA: Insufficient documentation

## 2024-05-14 DIAGNOSIS — N8110 Cystocele, unspecified: Secondary | ICD-10-CM

## 2024-05-14 DIAGNOSIS — R197 Diarrhea, unspecified: Secondary | ICD-10-CM

## 2024-05-14 DIAGNOSIS — N952 Postmenopausal atrophic vaginitis: Secondary | ICD-10-CM

## 2024-05-14 DIAGNOSIS — E669 Obesity, unspecified: Secondary | ICD-10-CM | POA: Insufficient documentation

## 2024-05-14 DIAGNOSIS — G4700 Insomnia, unspecified: Secondary | ICD-10-CM | POA: Insufficient documentation

## 2024-05-14 DIAGNOSIS — I700 Atherosclerosis of aorta: Secondary | ICD-10-CM | POA: Insufficient documentation

## 2024-05-14 DIAGNOSIS — N1831 Chronic kidney disease, stage 3a: Secondary | ICD-10-CM | POA: Insufficient documentation

## 2024-05-14 DIAGNOSIS — E559 Vitamin D deficiency, unspecified: Secondary | ICD-10-CM | POA: Insufficient documentation

## 2024-05-14 DIAGNOSIS — E7800 Pure hypercholesterolemia, unspecified: Secondary | ICD-10-CM | POA: Insufficient documentation

## 2024-05-14 NOTE — Progress Notes (Signed)
 [facility]   Subjective:     Chief Complaint:  Chief Complaint  Patient presents with   Pessary Check   History of Present Illness: [name] is a 83 y.o. female with stage IV pelvic organ prolapse who presents for a pessary check. She is using a size #5 short stem gellhorn pessary. The pessary has been working well and she has no complaints. She is using vaginal estrogen. She denies vaginal bleeding. She is having diarrhea today and reports she has had an accident and is worried about being checked. Also states she saw her PCP today.    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   Past Surgical History: She  has a past surgical history that includes Cholecystectomy; Appendectomy; Rotator cuff repair; Joint replacement; Joint replacement; Cataract extraction (2015); Eye surgery; and Laminectomy with posterior lateral arthrodesis level 2 (N/A, 06/07/2019).   Medications: She has a current medication list which includes the following prescription(s): acidophilus, proair  respiclick, allopurinol , apixaban , cefadroxil , vitamin d , donepezil, estradiol , ezetimibe , furosemide , hydrochlorothiazide , losartan , metoprolol  succinate, nortriptyline , and tramadol .   Allergies: Patient has no known allergies.   Social  History: Patient  reports that she has never smoked. She has never used smokeless tobacco. She reports current alcohol use of about 1.0 standard drink of alcohol per week. She reports that she does not use drugs.      Objective:    Physical Exam: BP (!) 138/91   Pulse 80  Gen: No apparent distress, A&O x 3. Detailed Urogynecologic Evaluation:  Pelvic Exam: Normal external female genitalia; Bartholin's and Skene's glands normal in appearance; urethral meatus normal in appearance, no urethral masses or discharge. The pessary was noted to be in place. It was removed and noted to be covered in diarrhea and patient's vagina had diarrhea that has caused a significant mess to her. Speculum exam revealed no lesions in the vagina. The vagina was cleaned using Hibiclens  due to surrounding stool. Rectal exam did not show any sign of fistula and once vagina was cleaned no more stool was noted to be in the vagina. The pessary was disposed of due to being covered in stool. A new #5 Long Stem Gellhorn was placed once tissues were cleaned.Patient was able to void without difficulty.     Assessment/Plan:    Assessment: [name] is a 83 y.o. with stage IV pelvic organ prolapse here for a pessary check. She is doing well.  Plan: She will keep the pessary in place until next visit. She will continue to use estrogen and boric acid suppositories for the vaginal secretions. She will follow-up in 3 months for a pessary check or sooner as needed.   Encouraged patient to take immodium for her bowels due to increased risk for UTI with stool noted around urethra. If patient develops symptoms of UTI would suggest treatment due to elevated risk.   All questions were answered.

## 2024-05-15 ENCOUNTER — Telehealth: Payer: Self-pay

## 2024-05-15 DIAGNOSIS — I10 Essential (primary) hypertension: Secondary | ICD-10-CM

## 2024-05-20 ENCOUNTER — Telehealth: Payer: Self-pay | Admitting: *Deleted

## 2024-05-20 NOTE — Progress Notes (Unsigned)
 Complex Care Management Note Care Guide Note  05/20/2024 Name: [name] MRN: [id number] DOB: 07-09-1940   Complex Care Management Outreach Attempts: An unsuccessful telephone outreach was attempted today to offer the patient information about available complex care management services.  Follow Up Plan:  Additional outreach attempts will be made to offer the patient complex care management information and services.   Encounter Outcome:  No Answer  Thedford Franks, CMA, [place]  [facility], [facility] Guide Direct Dial: [phone]  Fax: [phone] Website: [email or web address]

## 2024-05-21 NOTE — Progress Notes (Unsigned)
 Complex Care Management Note Care Guide Note  05/21/2024 Name: [name] MRN: [id number] DOB: 1941-05-21   Complex Care Management Outreach Attempts: A second unsuccessful outreach was attempted today to offer the patient with information about available complex care management services.  Follow Up Plan:  Additional outreach attempts will be made to offer the patient complex care management information and services.   Encounter Outcome:  No Answer  Thedford Franks, CMA [place]  [facility], [facility] Guide Direct Dial: [phone]  Fax: [phone] Website: [email or web address]

## 2024-05-22 NOTE — Progress Notes (Signed)
 Complex Care Management Note Care Guide Note  05/22/2024 Name: [name] MRN: [id number] DOB: Sep 11, 1940   Complex Care Management Outreach Attempts: A third unsuccessful outreach was attempted today to offer the patient with information about available complex care management services.  Follow Up Plan:  No further outreach attempts will be made at this time. We have been unable to contact the patient to offer or enroll patient in complex care management services.  Encounter Outcome:  No Answer  Thedford Franks, CMA [place]  [facility], [facility] Guide Direct Dial: [phone]  Fax: [phone] Website: [email or web address]

## 2024-05-30 ENCOUNTER — Telehealth: Payer: Self-pay

## 2024-05-30 NOTE — Patient Outreach (Signed)
 VM received from Nadia with PCP office checking to see if referral was received. SW contacted Nadia at [phone] ext [phone] and informed referral was received and careguides made 3 attempts to contact patient and they were unsuccessful. Inocente will inform PCP and send in a new referral.  Thersia Hoar, BSW, [facility]  [facility] Social Worker, Population Health [phone]

## 2024-06-03 ENCOUNTER — Telehealth: Payer: Self-pay

## 2024-06-03 DIAGNOSIS — Y92009 Unspecified place in unspecified non-institutional (private) residence as the place of occurrence of the external cause: Secondary | ICD-10-CM

## 2024-06-06 ENCOUNTER — Telehealth: Payer: Self-pay | Admitting: *Deleted

## 2024-06-06 NOTE — Progress Notes (Deleted)
 Complex Care Management Note Care Guide Note  06/06/2024 Name: XARENI KELCH MRN: [id number] DOB: [birth year]   Complex Care Management Outreach Attempts: An unsuccessful telephone outreach was attempted today to offer the patient information about available complex care management services.  Follow Up Plan:  Additional outreach attempts will be made to offer the patient complex care management information and services.   Encounter Outcome:  No Answer  Thedford Franks, CMA   [facility], [facility] Guide Direct Dial: [phone]  Fax: [phone] Website: [email or web address]

## 2024-06-06 NOTE — Progress Notes (Signed)
 Complex Care Management Note  Care Guide Note 06/06/2024 Name: [name] MRN: [id number] DOB: 01-24-1941  [name] is a 83 y.o. year old female who sees Gerome Brunet, [place] for primary care. I reached out to Comer CHRISTELLA Miu by phone today to offer complex care management services.  [name] was given information about Complex Care Management services today including:   The Complex Care Management services include support from the care team which includes your Nurse Care Manager, Clinical Social Worker, or Pharmacist.  The Complex Care Management team is here to help remove barriers to the health concerns and goals most important to you. Complex Care Management services are voluntary, and the patient may decline or stop services at any time by request to their care team member.   Complex Care Management Consent Status: Patient agreed to services and verbal consent obtained.   Follow up plan:  Telephone appointment with complex care management team member scheduled for:  06/19/2024  Encounter Outcome:  Patient Scheduled  Thedford Franks, CMA [place]  [facility], [facility] Guide Direct Dial: [phone]  Fax: [phone] Website: [email or web address]

## 2024-06-19 ENCOUNTER — Other Ambulatory Visit: Payer: Self-pay | Admitting: Licensed Clinical Social Worker

## 2024-06-19 NOTE — Patient Instructions (Signed)
 Visit Information  Thank you for taking time to visit with me today. Please don't hesitate to contact me if I can be of assistance to you before our next scheduled appointment.  Our next appointment is by telephone on 2/4 at 1 PM Please call the care guide team at [phone] if you need to cancel or reschedule your appointment.   Following is a copy of your care plan:   Goals Addressed             This Visit's Progress    LCSW VBCI Social Work Care Plan   On track    Problems:   Level of Care Concerns:Inability to perform ADL's independently  CSW Clinical Goal(s):   Over the next 60 days the Caregiver will work with Child Psychotherapist to address concerns related to [employer].  Interventions:  Level of Care Concerns in a patient with Dementia and Hx of CVA Current level of care: Home with other family or significant other(s): spouse  Evaluation of patient's unmet needs in current living environment ADL's Assessed needs, level of care concerns, how currently meeting needs and barriers to care Discussed private pay options for personal care needs (Caregiver reports he has looked into four separate agencies, has a preferred agency and is aware of their services and costs) Reviewed basic eligibility and provided education on Personal Care Service process, Solution-Focused Strategies employed: Caregiver will contact pt's insurance carrier to inquire if there is reimbursement for personal care services  Facility  Family are not interested in placement   Active listening / Reflection utilized Consideration on in-home help encouraged : options discussed Emotional Support Provided  Patient Goals/Self-Care Activities:  Continue taking your medication as prescribed.    Attend upcoming medical appts  Speak with pt's insurance company to inquire about benefits and/or reimbursement for personal care services within 30 days  Plan:   Telephone follow up appointment with  care management team member scheduled for:  4 weeks        Please call the Suicide and Crisis Lifeline: 988 go to [facility] Urgent [facility] [address] [place] [phone]) call 911 if you are experiencing a Mental Health or Behavioral Health Crisis or need someone to talk to.  Spouse verbalized understanding of Care plan and visit instructions communicated this visit  Rolin Kerns, LCSW [facility]  [facility], [facility] Clinical Social Worker Direct Dial: [phone]  Fax: [phone] Website: [email or web address] 1:49 PM

## 2024-06-19 NOTE — Patient Outreach (Signed)
 Complex Care Management   Visit Note  06/19/2024  Name:  [name] MRN: [id number] DOB: 15-Nov-1940  Situation: Referral received for Complex Care Management related to Dementia and Hx of CVA I obtained verbal consent from Caregiver.  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   Cerebral infarction due to embolism of right middle cerebral artery (HCC)    Diabetes mellitus    Diarrhea 11/30/2009   Qualifier: Diagnosis of   By: Fleeta Rothman MD, Cornelius       Excessive sleepiness 05/16/2022   Fatigue 05/16/2022   Fatty liver    Fibromyalgia    History of uterine prolapse 04/04/2022   Hyperlipidemia    Hypertension    Lymphoma malignant, large cell (HCC)    Neuropathy    Night sweat 05/16/2022   Non Hodgkin's lymphoma (HCC)    no active treatment   PAF (paroxysmal atrial fibrillation) (HCC)    Slurred speech 02/16/2015   Stroke (HCC)    Syncope 03/06/2018   Urge incontinence 04/04/2022   Urinary tract infection 09/19/2011   Being treated at this time Cipro    Assessment: Patient Reported Symptoms:  Cognitive Cognitive Status: Able to follow simple commands, Requires Assistance Decision Making   Health Maintenance Behaviors: Annual physical exam  Neurological Neurological Review of Symptoms: No symptoms reported    HEENT HEENT Symptoms Reported: Sore throat HEENT Management Strategies: Coping strategies, Adequate rest HEENT Comment: Patient has a cold    Cardiovascular Cardiovascular Symptoms Reported: No symptoms reported    Respiratory Respiratory Symptoms Reported: Dry cough Additional Respiratory Details: Associated to cold Respiratory Management Strategies: Coping strategies, Adequate rest  Endocrine Endocrine Symptoms Reported: No symptoms reported Is  patient diabetic?: Yes Is patient checking blood sugars at home?: No Endocrine Comment: Patient reports DM is managed well  Gastrointestinal Gastrointestinal Symptoms Reported: No symptoms reported      Genitourinary Genitourinary Symptoms Reported: Incontinence Genitourinary Management Strategies: Incontinence garment/pad  Integumentary Integumentary Symptoms Reported: No symptoms reported    Musculoskeletal Musculoskelatal Symptoms Reviewed: Joint pain, Limited mobility Musculoskeletal Management Strategies: Coping strategies, Adequate rest, Medication therapy, Routine screening Falls in the past year?: Yes Number of falls in past year: 2 or more Was there an injury with Fall?: No Fall Risk Category Calculator: 2 Patient Fall Risk Level: Moderate Fall Risk Patient at Risk for Falls Due to: History of fall(s), Impaired mobility Fall risk Follow up: Falls prevention discussed  Psychosocial Psychosocial Symptoms Reported: No symptoms reported Behavioral Management Strategies: Coping strategies, Support system Behavioral Health Comment: Caregiver denies stress or pt having concerns for depression/anxiety Major Change/Loss/Stressor/Fears (CP): Medical condition, self Techniques to Cope with Loss/Stress/Change: Diversional activities, Spiritual practice(s) Quality of Family Relationships: helpful, involved, supportive    06/19/2024    PHQ2-9 Depression Screening   Little interest or pleasure in doing things    Feeling down, depressed, or hopeless    PHQ-2 - Total Score    Trouble falling or staying asleep, or sleeping too much    Feeling tired or having little energy    Poor appetite or overeating     Feeling bad about yourself - or that you are a failure or have let yourself or your family down    Trouble concentrating on things, such as reading the newspaper or watching  television    Moving or speaking so slowly that other people could have noticed.  Or the opposite - being so  fidgety or restless that you have been moving around a lot more than usual    Thoughts that you would be better off dead, or hurting yourself in some way    PHQ2-9 Total Score    If you checked off any problems, how difficult have these problems made it for you to do your work, take care of things at home, or get along with other people    Depression Interventions/Treatment      There were no vitals filed for this visit.    Medications Reviewed Today   Medications were not reviewed in this encounter     Recommendation:   Continue Current Plan of Care  Follow Up Plan:   Telephone follow-up in 1 month  Rolin Kerns, LCSW [facility]  [facility], [facility] Clinical Social Worker Direct Dial: [phone]  Fax: [phone] Website: [email or web address] 1:48 PM

## 2024-07-24 ENCOUNTER — Telehealth: Payer: Self-pay | Admitting: Licensed Clinical Social Worker

## 2024-07-24 ENCOUNTER — Encounter: Payer: Self-pay | Admitting: Licensed Clinical Social Worker

## 2024-07-24 NOTE — Patient Instructions (Signed)
 [name] - I am sorry I was unable to reach you today for our scheduled appointment. I work with Collins, Dana, DO and am calling to support your healthcare needs. Please contact me at [phone] at your earliest convenience. I look forward to speaking with you soon.   Thank you,  Rolin Kerns, LCSW [place]  [facility], [facility] Clinical Social Worker Direct Dial: [phone]  Fax: [phone] Website: [email or web address] 2:00 PM

## 2024-08-07 ENCOUNTER — Telehealth: Admitting: Licensed Clinical Social Worker

## 2024-08-15 ENCOUNTER — Ambulatory Visit: Admitting: Obstetrics and Gynecology
